# Patient Record
Sex: Female | Born: 1975 | Race: Black or African American | Hispanic: No | Marital: Married | Smoking: Current every day smoker
Health system: Southern US, Community
[De-identification: ages and names within clinical notes are randomized; demographics above are authoritative.]

## PROBLEM LIST (undated history)

## (undated) DIAGNOSIS — I160 Hypertensive urgency: Secondary | ICD-10-CM

## (undated) DIAGNOSIS — F419 Anxiety disorder, unspecified: Secondary | ICD-10-CM

## (undated) DIAGNOSIS — G43909 Migraine, unspecified, not intractable, without status migrainosus: Secondary | ICD-10-CM

## (undated) DIAGNOSIS — I10 Essential (primary) hypertension: Secondary | ICD-10-CM

## (undated) DIAGNOSIS — F329 Major depressive disorder, single episode, unspecified: Secondary | ICD-10-CM

## (undated) DIAGNOSIS — R002 Palpitations: Secondary | ICD-10-CM

## (undated) DIAGNOSIS — D649 Anemia, unspecified: Secondary | ICD-10-CM

## (undated) DIAGNOSIS — F32A Depression, unspecified: Secondary | ICD-10-CM

## (undated) DIAGNOSIS — N179 Acute kidney failure, unspecified: Secondary | ICD-10-CM

## (undated) DIAGNOSIS — R1031 Right lower quadrant pain: Secondary | ICD-10-CM

## (undated) HISTORY — DX: Essential (primary) hypertension: I10

## (undated) HISTORY — DX: Anemia, unspecified: D64.9

## (undated) HISTORY — DX: Anxiety disorder, unspecified: F41.9

## (undated) HISTORY — DX: Palpitations: R00.2

---
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
## 1997-06-20 ENCOUNTER — Emergency Department (HOSPITAL_COMMUNITY): Admission: EM | Admit: 1997-06-20 | Discharge: 1997-06-20 | Payer: Self-pay | Admitting: Emergency Medicine

## 2000-01-24 ENCOUNTER — Emergency Department (HOSPITAL_COMMUNITY): Admission: EM | Admit: 2000-01-24 | Discharge: 2000-01-24 | Payer: Self-pay | Admitting: Emergency Medicine

## 2001-03-11 ENCOUNTER — Ambulatory Visit (HOSPITAL_COMMUNITY): Admission: RE | Admit: 2001-03-11 | Discharge: 2001-03-11 | Payer: Self-pay | Admitting: *Deleted

## 2001-07-24 ENCOUNTER — Inpatient Hospital Stay (HOSPITAL_COMMUNITY): Admission: AD | Admit: 2001-07-24 | Discharge: 2001-07-28 | Payer: Self-pay | Admitting: *Deleted

## 2001-08-02 ENCOUNTER — Inpatient Hospital Stay (HOSPITAL_COMMUNITY): Admission: AD | Admit: 2001-08-02 | Discharge: 2001-08-04 | Payer: Self-pay | Admitting: *Deleted

## 2001-09-10 ENCOUNTER — Emergency Department (HOSPITAL_COMMUNITY): Admission: EM | Admit: 2001-09-10 | Discharge: 2001-09-10 | Payer: Self-pay | Admitting: Emergency Medicine

## 2002-02-13 ENCOUNTER — Emergency Department (HOSPITAL_COMMUNITY): Admission: EM | Admit: 2002-02-13 | Discharge: 2002-02-14 | Payer: Self-pay | Admitting: Emergency Medicine

## 2002-03-30 ENCOUNTER — Emergency Department (HOSPITAL_COMMUNITY): Admission: EM | Admit: 2002-03-30 | Discharge: 2002-03-31 | Payer: Self-pay | Admitting: Emergency Medicine

## 2002-10-07 ENCOUNTER — Encounter: Admission: RE | Admit: 2002-10-07 | Discharge: 2002-10-07 | Payer: Self-pay | Admitting: Family Medicine

## 2002-10-11 ENCOUNTER — Ambulatory Visit (HOSPITAL_COMMUNITY): Admission: RE | Admit: 2002-10-11 | Discharge: 2002-10-11 | Payer: Self-pay | Admitting: Family Medicine

## 2002-12-16 ENCOUNTER — Encounter: Admission: RE | Admit: 2002-12-16 | Discharge: 2002-12-16 | Payer: Self-pay | Admitting: Family Medicine

## 2002-12-22 ENCOUNTER — Encounter: Admission: RE | Admit: 2002-12-22 | Discharge: 2002-12-22 | Payer: Self-pay | Admitting: Obstetrics and Gynecology

## 2003-01-30 ENCOUNTER — Ambulatory Visit (HOSPITAL_COMMUNITY): Admission: RE | Admit: 2003-01-30 | Discharge: 2003-01-30 | Payer: Self-pay | Admitting: Internal Medicine

## 2003-01-30 ENCOUNTER — Encounter: Admission: RE | Admit: 2003-01-30 | Discharge: 2003-01-30 | Payer: Self-pay | Admitting: Internal Medicine

## 2003-03-30 ENCOUNTER — Encounter: Admission: RE | Admit: 2003-03-30 | Discharge: 2003-03-30 | Payer: Self-pay | Admitting: Internal Medicine

## 2003-06-07 ENCOUNTER — Encounter (INDEPENDENT_AMBULATORY_CARE_PROVIDER_SITE_OTHER): Payer: Self-pay | Admitting: *Deleted

## 2003-10-05 ENCOUNTER — Ambulatory Visit (HOSPITAL_COMMUNITY): Admission: RE | Admit: 2003-10-05 | Discharge: 2003-10-05 | Payer: Self-pay | Admitting: *Deleted

## 2003-10-19 ENCOUNTER — Ambulatory Visit: Payer: Self-pay | Admitting: Family Medicine

## 2003-10-20 ENCOUNTER — Ambulatory Visit: Payer: Self-pay | Admitting: Obstetrics & Gynecology

## 2003-10-25 ENCOUNTER — Ambulatory Visit: Payer: Self-pay | Admitting: Obstetrics & Gynecology

## 2003-11-22 ENCOUNTER — Ambulatory Visit: Payer: Self-pay | Admitting: *Deleted

## 2003-12-06 ENCOUNTER — Ambulatory Visit: Payer: Self-pay | Admitting: *Deleted

## 2003-12-06 ENCOUNTER — Ambulatory Visit (HOSPITAL_COMMUNITY): Admission: RE | Admit: 2003-12-06 | Discharge: 2003-12-06 | Payer: Self-pay | Admitting: *Deleted

## 2003-12-12 ENCOUNTER — Inpatient Hospital Stay (HOSPITAL_COMMUNITY): Admission: AD | Admit: 2003-12-12 | Discharge: 2003-12-12 | Payer: Self-pay | Admitting: *Deleted

## 2003-12-27 ENCOUNTER — Ambulatory Visit: Payer: Self-pay | Admitting: *Deleted

## 2004-01-07 HISTORY — PX: TUBAL LIGATION: SHX77

## 2004-01-10 ENCOUNTER — Ambulatory Visit: Payer: Self-pay | Admitting: *Deleted

## 2004-01-24 ENCOUNTER — Ambulatory Visit: Payer: Self-pay | Admitting: *Deleted

## 2004-01-31 ENCOUNTER — Ambulatory Visit: Payer: Self-pay | Admitting: Obstetrics & Gynecology

## 2004-02-04 ENCOUNTER — Ambulatory Visit: Payer: Self-pay | Admitting: Obstetrics & Gynecology

## 2004-02-04 ENCOUNTER — Inpatient Hospital Stay (HOSPITAL_COMMUNITY): Admission: AD | Admit: 2004-02-04 | Discharge: 2004-02-06 | Payer: Self-pay | Admitting: Obstetrics & Gynecology

## 2004-02-14 ENCOUNTER — Ambulatory Visit: Payer: Self-pay | Admitting: Obstetrics & Gynecology

## 2004-02-21 ENCOUNTER — Ambulatory Visit: Payer: Self-pay | Admitting: *Deleted

## 2004-02-23 ENCOUNTER — Inpatient Hospital Stay (HOSPITAL_COMMUNITY): Admission: AD | Admit: 2004-02-23 | Discharge: 2004-02-23 | Payer: Self-pay | Admitting: Obstetrics and Gynecology

## 2004-02-28 ENCOUNTER — Ambulatory Visit (HOSPITAL_COMMUNITY): Admission: RE | Admit: 2004-02-28 | Discharge: 2004-02-28 | Payer: Self-pay | Admitting: Obstetrics & Gynecology

## 2004-02-28 ENCOUNTER — Ambulatory Visit: Payer: Self-pay | Admitting: *Deleted

## 2004-03-06 ENCOUNTER — Ambulatory Visit: Payer: Self-pay | Admitting: *Deleted

## 2004-03-07 ENCOUNTER — Ambulatory Visit: Payer: Self-pay | Admitting: Family Medicine

## 2004-03-13 ENCOUNTER — Ambulatory Visit: Payer: Self-pay | Admitting: *Deleted

## 2004-03-20 ENCOUNTER — Ambulatory Visit: Payer: Self-pay | Admitting: *Deleted

## 2004-03-20 ENCOUNTER — Ambulatory Visit (HOSPITAL_COMMUNITY): Admission: RE | Admit: 2004-03-20 | Discharge: 2004-03-20 | Payer: Self-pay | Admitting: *Deleted

## 2004-03-27 ENCOUNTER — Ambulatory Visit: Payer: Self-pay | Admitting: *Deleted

## 2004-04-03 ENCOUNTER — Ambulatory Visit: Payer: Self-pay | Admitting: *Deleted

## 2004-04-10 ENCOUNTER — Inpatient Hospital Stay (HOSPITAL_COMMUNITY): Admission: AD | Admit: 2004-04-10 | Discharge: 2004-04-10 | Payer: Self-pay | Admitting: Obstetrics & Gynecology

## 2004-04-16 ENCOUNTER — Inpatient Hospital Stay (HOSPITAL_COMMUNITY): Admission: AD | Admit: 2004-04-16 | Discharge: 2004-04-16 | Payer: Self-pay | Admitting: *Deleted

## 2004-04-17 ENCOUNTER — Inpatient Hospital Stay (HOSPITAL_COMMUNITY): Admission: AD | Admit: 2004-04-17 | Discharge: 2004-04-17 | Payer: Self-pay | Admitting: Obstetrics and Gynecology

## 2004-04-18 ENCOUNTER — Ambulatory Visit: Payer: Self-pay | Admitting: Family Medicine

## 2004-04-23 ENCOUNTER — Ambulatory Visit: Payer: Self-pay | Admitting: *Deleted

## 2004-04-23 ENCOUNTER — Inpatient Hospital Stay (HOSPITAL_COMMUNITY): Admission: AD | Admit: 2004-04-23 | Discharge: 2004-04-23 | Payer: Self-pay | Admitting: *Deleted

## 2004-04-25 ENCOUNTER — Ambulatory Visit: Payer: Self-pay | Admitting: Family Medicine

## 2004-04-25 ENCOUNTER — Inpatient Hospital Stay (HOSPITAL_COMMUNITY): Admission: AD | Admit: 2004-04-25 | Discharge: 2004-04-28 | Payer: Self-pay | Admitting: *Deleted

## 2004-04-30 ENCOUNTER — Inpatient Hospital Stay (HOSPITAL_COMMUNITY): Admission: AD | Admit: 2004-04-30 | Discharge: 2004-05-02 | Payer: Self-pay | Admitting: *Deleted

## 2004-04-30 ENCOUNTER — Ambulatory Visit: Payer: Self-pay | Admitting: Obstetrics & Gynecology

## 2004-06-04 ENCOUNTER — Ambulatory Visit: Payer: Self-pay | Admitting: Family Medicine

## 2004-07-02 ENCOUNTER — Ambulatory Visit: Payer: Self-pay | Admitting: Sports Medicine

## 2005-01-22 ENCOUNTER — Ambulatory Visit: Payer: Self-pay | Admitting: Family Medicine

## 2005-01-23 ENCOUNTER — Ambulatory Visit: Payer: Self-pay | Admitting: *Deleted

## 2005-02-05 ENCOUNTER — Other Ambulatory Visit: Admission: RE | Admit: 2005-02-05 | Discharge: 2005-02-05 | Payer: Self-pay | Admitting: Family Medicine

## 2005-02-05 ENCOUNTER — Ambulatory Visit: Payer: Self-pay | Admitting: Family Medicine

## 2005-06-04 ENCOUNTER — Ambulatory Visit: Payer: Self-pay | Admitting: Family Medicine

## 2005-06-09 ENCOUNTER — Emergency Department (HOSPITAL_COMMUNITY): Admission: EM | Admit: 2005-06-09 | Discharge: 2005-06-09 | Payer: Self-pay | Admitting: Emergency Medicine

## 2005-06-11 ENCOUNTER — Ambulatory Visit: Payer: Self-pay | Admitting: Family Medicine

## 2005-06-16 ENCOUNTER — Ambulatory Visit (HOSPITAL_COMMUNITY): Admission: RE | Admit: 2005-06-16 | Discharge: 2005-06-16 | Payer: Self-pay | Admitting: Family Medicine

## 2005-06-30 ENCOUNTER — Ambulatory Visit: Payer: Self-pay | Admitting: Family Medicine

## 2005-08-25 ENCOUNTER — Emergency Department (HOSPITAL_COMMUNITY): Admission: EM | Admit: 2005-08-25 | Discharge: 2005-08-25 | Payer: Self-pay | Admitting: Emergency Medicine

## 2006-03-05 DIAGNOSIS — I10 Essential (primary) hypertension: Secondary | ICD-10-CM

## 2006-03-06 ENCOUNTER — Encounter (INDEPENDENT_AMBULATORY_CARE_PROVIDER_SITE_OTHER): Payer: Self-pay | Admitting: *Deleted

## 2006-07-04 ENCOUNTER — Emergency Department (HOSPITAL_COMMUNITY): Admission: EM | Admit: 2006-07-04 | Discharge: 2006-07-04 | Payer: Self-pay | Admitting: Emergency Medicine

## 2008-01-07 HISTORY — PX: HYSTEROSCOPY W/ ENDOMETRIAL ABLATION: SUR665

## 2008-09-20 ENCOUNTER — Emergency Department (HOSPITAL_COMMUNITY): Admission: EM | Admit: 2008-09-20 | Discharge: 2008-09-20 | Payer: Self-pay | Admitting: Family Medicine

## 2008-09-30 ENCOUNTER — Other Ambulatory Visit: Payer: Self-pay | Admitting: Emergency Medicine

## 2008-09-30 ENCOUNTER — Encounter: Payer: Self-pay | Admitting: Obstetrics & Gynecology

## 2008-09-30 ENCOUNTER — Observation Stay (HOSPITAL_COMMUNITY): Admission: AD | Admit: 2008-09-30 | Discharge: 2008-10-01 | Payer: Self-pay | Admitting: Obstetrics & Gynecology

## 2008-10-12 ENCOUNTER — Ambulatory Visit: Payer: Self-pay | Admitting: Obstetrics & Gynecology

## 2008-10-12 ENCOUNTER — Encounter: Payer: Self-pay | Admitting: Obstetrics and Gynecology

## 2008-11-23 ENCOUNTER — Encounter: Payer: Self-pay | Admitting: Obstetrics and Gynecology

## 2008-11-23 ENCOUNTER — Ambulatory Visit: Payer: Self-pay | Admitting: Obstetrics & Gynecology

## 2008-11-23 LAB — CONVERTED CEMR LAB
Hemoglobin: 14.1 g/dL (ref 12.0–15.0)
MCHC: 33.3 g/dL (ref 30.0–36.0)
MCV: 86.3 fL (ref 78.0–100.0)
RBC: 4.9 M/uL (ref 3.87–5.11)
WBC: 9.6 10*3/uL (ref 4.0–10.5)

## 2008-11-24 ENCOUNTER — Ambulatory Visit: Payer: Self-pay | Admitting: Family Medicine

## 2008-11-24 ENCOUNTER — Encounter: Payer: Self-pay | Admitting: Family Medicine

## 2008-11-24 DIAGNOSIS — N921 Excessive and frequent menstruation with irregular cycle: Secondary | ICD-10-CM | POA: Insufficient documentation

## 2008-11-24 DIAGNOSIS — F17200 Nicotine dependence, unspecified, uncomplicated: Secondary | ICD-10-CM | POA: Insufficient documentation

## 2008-11-24 LAB — CONVERTED CEMR LAB
CO2: 21 meq/L (ref 19–32)
Calcium: 10.7 mg/dL — ABNORMAL HIGH (ref 8.4–10.5)
Chloride: 101 meq/L (ref 96–112)
Glucose, Bld: 76 mg/dL (ref 70–99)
Sodium: 139 meq/L (ref 135–145)

## 2008-11-27 ENCOUNTER — Ambulatory Visit: Payer: Self-pay | Admitting: Family Medicine

## 2008-12-08 ENCOUNTER — Ambulatory Visit: Payer: Self-pay | Admitting: Family Medicine

## 2008-12-13 ENCOUNTER — Ambulatory Visit (HOSPITAL_COMMUNITY): Admission: RE | Admit: 2008-12-13 | Discharge: 2008-12-13 | Payer: Self-pay | Admitting: Obstetrics & Gynecology

## 2008-12-13 ENCOUNTER — Ambulatory Visit: Payer: Self-pay | Admitting: Obstetrics & Gynecology

## 2008-12-31 ENCOUNTER — Emergency Department (HOSPITAL_COMMUNITY): Admission: EM | Admit: 2008-12-31 | Discharge: 2008-12-31 | Payer: Self-pay | Admitting: Emergency Medicine

## 2009-01-09 ENCOUNTER — Encounter: Payer: Self-pay | Admitting: Family Medicine

## 2009-01-09 ENCOUNTER — Ambulatory Visit: Payer: Self-pay | Admitting: Family Medicine

## 2009-01-09 LAB — CONVERTED CEMR LAB
BUN: 10 mg/dL (ref 6–23)
CO2: 25 meq/L (ref 19–32)
Calcium: 9.8 mg/dL (ref 8.4–10.5)
Creatinine, Ser: 0.67 mg/dL (ref 0.40–1.20)

## 2009-01-11 ENCOUNTER — Ambulatory Visit: Payer: Self-pay | Admitting: Obstetrics and Gynecology

## 2009-01-11 ENCOUNTER — Encounter: Payer: Self-pay | Admitting: Family Medicine

## 2009-01-25 ENCOUNTER — Ambulatory Visit: Payer: Self-pay | Admitting: Family Medicine

## 2009-01-25 DIAGNOSIS — J019 Acute sinusitis, unspecified: Secondary | ICD-10-CM

## 2009-01-31 ENCOUNTER — Encounter: Payer: Self-pay | Admitting: Family Medicine

## 2009-02-26 ENCOUNTER — Ambulatory Visit: Payer: Self-pay | Admitting: Family Medicine

## 2009-02-26 DIAGNOSIS — F4001 Agoraphobia with panic disorder: Secondary | ICD-10-CM

## 2009-03-15 ENCOUNTER — Ambulatory Visit: Payer: Self-pay | Admitting: Obstetrics & Gynecology

## 2009-03-15 ENCOUNTER — Encounter: Payer: Self-pay | Admitting: Obstetrics & Gynecology

## 2009-03-15 LAB — CONVERTED CEMR LAB
MCHC: 35.5 g/dL (ref 30.0–36.0)
MCV: 88.6 fL (ref 78.0–100.0)
Platelets: 284 10*3/uL (ref 150–400)
RBC: 4.64 M/uL (ref 3.87–5.11)
RDW: 12.4 % (ref 11.5–15.5)

## 2009-04-17 ENCOUNTER — Ambulatory Visit: Payer: Self-pay | Admitting: Family Medicine

## 2009-04-17 ENCOUNTER — Encounter: Payer: Self-pay | Admitting: Family Medicine

## 2009-04-17 LAB — CONVERTED CEMR LAB: Whiff Test: NEGATIVE

## 2009-05-15 ENCOUNTER — Ambulatory Visit: Payer: Self-pay | Admitting: Family Medicine

## 2009-05-15 DIAGNOSIS — R300 Dysuria: Secondary | ICD-10-CM | POA: Insufficient documentation

## 2009-05-15 DIAGNOSIS — R1011 Right upper quadrant pain: Secondary | ICD-10-CM

## 2009-05-15 LAB — CONVERTED CEMR LAB
ALT: 24 units/L (ref 0–35)
AST: 21 units/L (ref 0–37)
BUN: 11 mg/dL (ref 6–23)
Blood in Urine, dipstick: NEGATIVE
Calcium: 9.1 mg/dL (ref 8.4–10.5)
Chloride: 107 meq/L (ref 96–112)
Creatinine, Ser: 0.85 mg/dL (ref 0.40–1.20)
Glucose, Urine, Semiquant: NEGATIVE
HCT: 40.6 % (ref 36.0–46.0)
Hemoglobin: 14.1 g/dL (ref 12.0–15.0)
MCHC: 34.7 g/dL (ref 30.0–36.0)
MCV: 89.4 fL (ref 78.0–100.0)
Nitrite: NEGATIVE
Platelets: 198 10*3/uL (ref 150–400)
Protein, U semiquant: NEGATIVE
RDW: 12.5 % (ref 11.5–15.5)
Total Bilirubin: 0.3 mg/dL (ref 0.3–1.2)
Urobilinogen, UA: 0.2
WBC Urine, dipstick: NEGATIVE

## 2009-05-16 ENCOUNTER — Telehealth: Payer: Self-pay | Admitting: Family Medicine

## 2009-05-17 ENCOUNTER — Encounter: Admission: RE | Admit: 2009-05-17 | Discharge: 2009-05-17 | Payer: Self-pay | Admitting: Family Medicine

## 2009-05-17 ENCOUNTER — Telehealth: Payer: Self-pay | Admitting: Family Medicine

## 2009-10-15 ENCOUNTER — Encounter: Payer: Self-pay | Admitting: Family Medicine

## 2009-10-15 ENCOUNTER — Ambulatory Visit: Payer: Self-pay | Admitting: Family Medicine

## 2009-10-15 DIAGNOSIS — R1031 Right lower quadrant pain: Secondary | ICD-10-CM | POA: Insufficient documentation

## 2009-10-15 LAB — CONVERTED CEMR LAB
ALT: 32 units/L (ref 0–35)
AST: 26 units/L (ref 0–37)
Albumin: 4.7 g/dL (ref 3.5–5.2)
Alkaline Phosphatase: 71 units/L (ref 39–117)
BUN: 10 mg/dL (ref 6–23)
Basophils Absolute: 0 10*3/uL (ref 0.0–0.1)
Basophils Relative: 0 % (ref 0–1)
Beta hcg, urine, semiquantitative: NEGATIVE
Bilirubin Urine: NEGATIVE
Blood in Urine, dipstick: NEGATIVE
CO2: 28 meq/L (ref 19–32)
Calcium: 10.1 mg/dL (ref 8.4–10.5)
Chlamydia, DNA Probe: NEGATIVE
Chloride: 101 meq/L (ref 96–112)
Creatinine, Ser: 0.94 mg/dL (ref 0.40–1.20)
Eosinophils Absolute: 0.9 10*3/uL — ABNORMAL HIGH (ref 0.0–0.7)
Eosinophils Relative: 6 % — ABNORMAL HIGH (ref 0–5)
GC Probe Amp, Genital: NEGATIVE
Glucose, Bld: 80 mg/dL (ref 70–99)
Glucose, Urine, Semiquant: NEGATIVE
HCT: 44.9 % (ref 36.0–46.0)
Hemoglobin: 15.7 g/dL — ABNORMAL HIGH (ref 12.0–15.0)
Ketones, urine, test strip: NEGATIVE
Lymphocytes Relative: 17 % (ref 12–46)
Lymphs Abs: 2.4 10*3/uL (ref 0.7–4.0)
MCHC: 35 g/dL (ref 30.0–36.0)
MCV: 91.8 fL (ref 78.0–100.0)
Monocytes Absolute: 0.9 10*3/uL (ref 0.1–1.0)
Monocytes Relative: 6 % (ref 3–12)
Neutro Abs: 10.1 10*3/uL — ABNORMAL HIGH (ref 1.7–7.7)
Neutrophils Relative %: 71 % (ref 43–77)
Nitrite: NEGATIVE
Platelets: 261 10*3/uL (ref 150–400)
Potassium: 3.9 meq/L (ref 3.5–5.3)
Protein, U semiquant: NEGATIVE
RBC: 4.89 M/uL (ref 3.87–5.11)
RDW: 12.1 % (ref 11.5–15.5)
Sodium: 139 meq/L (ref 135–145)
Specific Gravity, Urine: 1.02
Total Bilirubin: 0.5 mg/dL (ref 0.3–1.2)
Total Protein: 7.7 g/dL (ref 6.0–8.3)
Urobilinogen, UA: 0.2
WBC: 14.3 10*3/uL — ABNORMAL HIGH (ref 4.0–10.5)
Whiff Test: NEGATIVE
pH: 7.5

## 2009-12-21 ENCOUNTER — Encounter: Payer: Self-pay | Admitting: Family Medicine

## 2010-01-24 ENCOUNTER — Telehealth: Payer: Self-pay | Admitting: Family Medicine

## 2010-02-03 ENCOUNTER — Emergency Department (HOSPITAL_COMMUNITY)
Admission: EM | Admit: 2010-02-03 | Discharge: 2010-02-03 | Payer: Self-pay | Source: Home / Self Care | Admitting: Emergency Medicine

## 2010-02-03 DIAGNOSIS — R0602 Shortness of breath: Secondary | ICD-10-CM | POA: Insufficient documentation

## 2010-02-03 LAB — COMPREHENSIVE METABOLIC PANEL
AST: 28 U/L (ref 0–37)
BUN: 9 mg/dL (ref 6–23)
CO2: 25 mEq/L (ref 19–32)
Calcium: 9.3 mg/dL (ref 8.4–10.5)
Creatinine, Ser: 0.73 mg/dL (ref 0.4–1.2)
GFR calc Af Amer: >60 mL/min (ref >60)
GFR calc non Af Amer: >60 mL/min (ref >60)

## 2010-02-03 LAB — DIFFERENTIAL
Basophils Absolute: 0.1 10*3/uL (ref 0.0–0.1)
Lymphocytes Relative: 19 % (ref 12–46)
Lymphs Abs: 2.7 10*3/uL (ref 0.7–4.0)
Monocytes Absolute: 0.9 10*3/uL (ref 0.1–1.0)
Monocytes Relative: 7 % (ref 3–12)
Neutro Abs: 9.9 10*3/uL — ABNORMAL HIGH (ref 1.7–7.7)

## 2010-02-03 LAB — CBC
HCT: 46.3 % — ABNORMAL HIGH (ref 36.0–46.0)
Hemoglobin: 16.6 g/dL — ABNORMAL HIGH (ref 12.0–15.0)
MCHC: 35.9 g/dL (ref 30.0–36.0)
MCV: 90.8 fL (ref 78.0–100.0)

## 2010-02-03 LAB — POCT CARDIAC MARKERS
CKMB, poc: <1 ng/mL — ABNORMAL LOW (ref 1.0–8.0)
Myoglobin, poc: 72.8 ng/mL (ref 12–200)
Myoglobin, poc: 49.7 ng/mL (ref 12–200)
Troponin i, poc: <0.05 ng/mL (ref 0.00–0.09)
Troponin i, poc: <0.05 ng/mL (ref 0.00–0.09)

## 2010-02-05 ENCOUNTER — Telehealth: Payer: Self-pay | Admitting: Family Medicine

## 2010-02-05 ENCOUNTER — Encounter: Payer: Self-pay | Admitting: Family Medicine

## 2010-02-05 ENCOUNTER — Ambulatory Visit: Admission: RE | Admit: 2010-02-05 | Discharge: 2010-02-05 | Payer: Self-pay | Source: Home / Self Care

## 2010-02-05 ENCOUNTER — Other Ambulatory Visit: Payer: Self-pay

## 2010-02-05 DIAGNOSIS — E871 Hypo-osmolality and hyponatremia: Secondary | ICD-10-CM | POA: Insufficient documentation

## 2010-02-05 DIAGNOSIS — R002 Palpitations: Secondary | ICD-10-CM | POA: Insufficient documentation

## 2010-02-05 LAB — CONVERTED CEMR LAB
BUN: 11 mg/dL (ref 6–23)
Calcium: 10.2 mg/dL (ref 8.4–10.5)
Chloride: 100 meq/L (ref 96–112)
Creatinine, Ser: 0.81 mg/dL (ref 0.40–1.20)

## 2010-02-07 NOTE — Miscellaneous (Signed)
  Clinical Lists Changes  Problems: Removed problem of OTHER SYMPTOMS INVOLVING ABDOMEN AND PELVIS (ICD-789.9) Removed problem of CONTACT OR EXPOSURE TO OTHER VIRAL DISEASES (ICD-V01.79)

## 2010-02-07 NOTE — Progress Notes (Signed)
  Phone Note Outgoing Call   Call placed by: Paula Compton MD,  May 16, 2009 9:38 AM Call placed to: Patient Summary of Call: Called patient at [phone] to give results of labs.  She has the [place] scheduled for tomorrow.  Still  has the abd pain a little bit, not as bad as before.  Initial call taken by: Paula Compton MD,  May 16, 2009 9:40 AM

## 2010-02-07 NOTE — Progress Notes (Signed)
  Phone Note Outgoing Call   Call placed by: Paula Compton MD,  May 17, 2009 10:21 AM Call placed to: Patient Summary of Call: Called patient at [phone], she says the pain is better.  Reviewed results of the abd [place], which does not show cholelithiasis nor other potential causes of the pain.  To continue with current plan. She agrees. Initial call taken by: Paula Compton MD,  May 17, 2009 10:23 AM

## 2010-02-07 NOTE — Miscellaneous (Signed)
Summary: meds  C   linical Lists Changes medicaid will not pay for fexodenadine. they want pt to try & then fail 2 others first-ceterizine OTC & Loratidine OTC. for the nasonex, try flunisolide or fluticasone. form in your chart box if you want to try to get them approved as is.Golden Circle RN  January 31, 2009 11:17 AM  Medications: Removed medication of NASONEX 50 MCG/ACT SUSP (MOMETASONE FUROATE) 2 sprays ea nostril daily Removed medication of ALLEGRA 180 MG TABS (FEXOFENADINE HCL) 1 by mouth daily Added new medication of FLUTICASONE PROPIONATE 50 MCG/ACT SUSP (FLUTICASONE PROPIONATE) 2 sprays each nostril daily - Signed Rx of FLUTICASONE PROPIONATE 50 MCG/ACT SUSP (FLUTICASONE PROPIONATE) 2 sprays each nostril daily;  #1bottle x 1;  Signed;  Entered by: Clementeen Graham MD;  Authorized by: Clementeen Graham MD;  Method used: Electronically to [facility] [address] [phone]*, [address] [place], [place], [place]  [address], Ph: [phone] or [phone], Fax: [phone]    Called patient back.  She will take Flonase nasal spray and use claritin D OTC.  She will return to clinic if not improved in a few days.      Prescriptions: FLUTICASONE PROPIONATE 50 MCG/ACT SUSP (FLUTICASONE PROPIONATE) 2 sprays each nostril daily  #1bottle x 1   Entered and Authorized by:   Clementeen Graham MD   Signed by:   Clementeen Graham MD on 02/09/2009   Method used:   Electronically to        [facility] [address] [phone]* (retail)       [address]       [place], [place]  [address]       Ph: [phone] or [phone]       Fax: [phone]   RxID:   [id number]

## 2010-02-07 NOTE — Assessment & Plan Note (Signed)
Summary: abdominal pain/corey/bmc   Vital Signs:  Patient profile:   35 year old female Height:      68 inches Weight:      175.56 pounds BMI:     26.79 BSA:     1.94 Temp:     98.3 degrees F Pulse rate:   90 / minute BP sitting:   135 / 91  Vitals Entered By: Jone Baseman CMA (October 15, 2009 2:38 PM) CC: abdominal pain x 3 days Is Patient Diabetic? No Pain Assessment Patient in pain? yes     Location: right flank Intensity: 6   Primary Care Provider:  Clementeen Graham, MD  CC:  abdominal pain x 3 days.  History of Present Illness: 35 yo F:  1. Abdominal Pain: Right lower quadrant, x 3 days, intermittent, radiates to suprapubic area and low back, worse with movevement, better with Ibuprofen, LMP last Feb (s/p ablation for menorrhagia 2/2 ? fibroid), previously Dx with ovarian cyst for similar complaint, + sexually active with one partner. Denies vaginal discharge, dysuria, fever/chills, HA, dizziness, edema, rash. Endorses N/V "all day" Saturday. No diarrhea. + black stools but took Pepto Bismol the day before. + constipation - not using stool softener/laxative. + Tob, + occasional ETOH, no drugs. 25 minutes spent with patient for full H&P.  Habits & Providers  Alcohol-Tobacco-Diet     Tobacco Status: current     Tobacco Counseling: to quit use of tobacco products     Cigarette Packs/Day: 0.5  Current Medications (verified): 1)  Lisinopril-Hydrochlorothiazide 20-25 Mg Tabs (Lisinopril-Hydrochlorothiazide) .... Take 1 Pill By Mouth Daily 2)  Bupropion Hcl 150 Mg Xr12h-Tab (Bupropion Hcl) .... Take 1/2 Daily For Three Days Then 1 Tab Daily By Mouth Every Morning  Allergies (verified): No Known Drug Allergies PMH-FH-SH reviewed for relevance  Review of Systems      See HPI  Physical Exam  General:  Well-developed,well-nourished, unconfortable-appearing. Vitals reviewed. Lungs:  Normal respiratory effort, chest expands symmetrically. Lungs are clear to auscultation,  no crackles or wheezes. Heart:  Normal rate and regular rhythm. S1 and S2 normal without gallop, murmur, click, rub or other extra sounds. Abdomen:  Soft, nontender, nondistended. Mild discomfort with deep palpation RLQ. No rebound. No masses or megaly.  Normal bowel sounds noted. Negative for CVA tenderness. No suprapubic tenderness.  Genitalia:  Pelvic Exam:        External: normal female genitalia without lesions or masses        Vagina: normal without lesions or masses, think white discharge - samples taken        Cervix: normal without lesions or masses        Adnexa: slight ttp over left ovary        Uterus: normal by palpation Pulses:  R and L dorsalis pedis and posterior tibial pulses are full and equal bilaterally. Extremities:  No edema. Skin:  Intact without suspicious lesions or rashes.   Impression & Recommendations:  Problem # 1:  ABDOMINAL PAIN, RIGHT LOWER QUADRANT (ICD-789.03) Assessment New No red flags. See patient instructions. Patient left after giving urine sample - UA revealed trichomonas. Will have patient called to come back for treatment with Flagyl 2 grams (single dose). DDx: PID, ovarian cyst, gastroenteritis, early appendicitis (less likely), other infectious. Precautions given. Advised to call in 1-2 days if not improving. May need [place]. Orders: Comp Met-[facility] [id number]) CBC w/Diff-[facility] (85025) U Preg-[facility] (81025) [facility]- Est  Level 4 ([address]782)  Complete Medication List: 1)  Lisinopril-hydrochlorothiazide 20-25 Mg  Tabs (Lisinopril-hydrochlorothiazide) .... Take 1 pill by mouth daily 2)  Bupropion Hcl 150 Mg Xr12h-tab (Bupropion hcl) .... Take 1/2 daily for three days then 1 tab daily by mouth every morning  Other Orders: Urinalysis-[facility] (00000) GC/Chlamydia-[facility] (87591/87491) [facility] ([address]78)  Patient Instructions: 1)  It was nice to meet you today! 2)  We are evaluating the reason for your right lower abdominal pain.  3)  We are checking labs and your  urine.  4)  This is likely due to an ovarian cyst. 5)  Continue taking Ibuprofen - 600 mg by mouth every 6 hours as needed for pain. 6)  If your pain worsens, we will get an ultrasound.  Laboratory Results   Urine Tests  Date/Time Received: October 15, 2009 3:00 PM  Date/Time Reported: October 15, 2009 3:36 PM   Routine Urinalysis   Color: yellow Appearance: Clear Glucose: negative   (Normal Range: Negative) Bilirubin: negative   (Normal Range: Negative) Ketone: negative   (Normal Range: Negative) Spec. Gravity: 1.020   (Normal Range: 1.003-1.035) Blood: negative   (Normal Range: Negative) pH: 7.5   (Normal Range: 5.0-8.0) Protein: negative   (Normal Range: Negative) Urobilinogen: 0.2   (Normal Range: 0-1) Nitrite: negative   (Normal Range: Negative) Leukocyte Esterace: trace   (Normal Range: Negative)  Urine Microscopic WBC/HPF: 1-5 RBC/HPF: 0-2 Bacteria/HPF: trace Epithelial/HPF: 0-3 Other: few trich    Urine HCG: negative Comments: ...........test performed by...........Marland KitchenTerese Door, CMA  Date/Time Received: October 15, 2009 3:00 PM  Date/Time Reported: October 15, 2009 3:09 PM   [employer] Source: vaginal WBC/hpf: 1-5 Bacteria/hpf: 2+  Rods Clue cells/hpf: none  Negative whiff Yeast/hpf: none Trichomonas/hpf: none Comments: ...........test performed by...........Marland KitchenTerese Door, CMA     Appended Document: abdominal pain/corey/bmc Please call this patient to come in for + trichomonas. Please give Flagyl 2 grams - single dose.

## 2010-02-07 NOTE — Progress Notes (Signed)
Summary: refill  Phone Note Refill Request Call back at Home Phone [phone] Message from:  Patient  Refills Requested: Medication #1:  LISINOPRIL-HYDROCHLOROTHIAZIDE 20-25 MG TABS take 1 pill by mouth daily [facility] [address]  Initial call taken by: De Nurse,  January 24, 2010 9:25 AM    Prescriptions: LISINOPRIL-HYDROCHLOROTHIAZIDE 20-25 MG TABS (LISINOPRIL-HYDROCHLOROTHIAZIDE) take 1 pill by mouth daily  #30 x 3   Entered and Authorized by:   Clementeen Graham MD   Signed by:   Clementeen Graham MD on 01/24/2010   Method used:   Electronically to        [facility] [phone]* (retail)       [address]       [place], [place]  [address]       Ph: [phone]       Fax: [phone]   RxID:   [id number]

## 2010-02-07 NOTE — Assessment & Plan Note (Signed)
Summary: fu/kh   Vital Signs:  Patient profile:   35 year old female Height:      68 inches Weight:      161 pounds BMI:     24.57 Temp:     98.3 degrees F oral Pulse rate:   76 / minute BP sitting:   116 / 73  (right arm) Cuff size:   regular  Vitals Entered By: Tessie Fass CMA (January 09, 2009 3:21 PM) CC: F/U BP Is Patient Diabetic? No Pain Assessment Patient in pain? yes     Location: right side Intensity: 6   Primary Care Provider:  Clementeen Graham, MD  CC:  F/U BP.  History of Present Illness: HTN:   Pt has been taking her BP medications daily.  She feels well with no syncope or pre-syncope symptoms.  She is interested in combining the medications into 1 comb pill if able. No chest pain, dyspnea, or palpations.  Smoking: Plan to quit smoking with the GYN surgery last month. Tried to quit on own.  Is now smoking 1/2 ppd and wishes to quit completly. Has quit in the past sucessfully.  Has a personal history of 1 episode of depression which was treated with SSRI 6 months.  No seizure disorder.   Menstural Bleeding.  Had an endrometrial ablation last month.  Still having some residual bleeding.  Has an appointment with Dr. Debroah Loop on Thursday.  Habits & Providers  Alcohol-Tobacco-Diet     Tobacco Status: current     Cigarette Packs/Day: 0.5  Current Problems (verified): 1)  Cigarette Smoker  (ICD-305.1) 2)  Metrorrhagia  (ICD-626.6) 3)  Hypertension, Benign Systemic  (ICD-401.1)  Current Medications (verified): 1)  Medroxyprogesterone .Marland Kitchen.. 1 Tab By Mouth Bid 2)  Iron 325 (65 Fe) Mg Tabs (Ferrous Sulfate) .Marland Kitchen.. 1 By Mouth Bid 3)  Lisinopril-Hydrochlorothiazide 20-25 Mg Tabs (Lisinopril-Hydrochlorothiazide) .... Take 1 Pill By Mouth Daily 4)  Zyban 150 Mg Xr12h-Tab (Bupropion Hcl (Smoking Deter)) .... Take 1 Pill Daily For 7 Days Prior To Quit Date.  Then 1 Pill By Mouth Two Times A Day.  Allergies (verified): No Known Drug Allergies  Past History:  Past  Medical History: Last updated: 11/24/2008 [id number] Hypertension since 35 yo off and on HCTZ.  Not well controlled.  Has symptoms of end organ damage Daily light menstural bleeding since 09/2008.  Currently on Progresterone with plans for endormetral ablation. Hx of blood transfusion in 09/2008 due to anemia 2/2 menstural bleeding.  Now on iron.  Past Surgical History: Last updated: 03/05/2006 BTL - 04/06/2004  Family History: Last updated: 11/24/2008 HTN in Mother and Brother  Social History: Last updated: 11/24/2008 Lives with , Jill Side and daughter Genesis.  Divorced from husband. Smokes 1ppd, but had quit for 5 years in the past.   Risk Factors: Smoking Status: current (01/09/2009) Packs/Day: 0.5 (01/09/2009)  Social History: Packs/Day:  0.5  Review of Systems       ROS neg except for HPI.  Physical Exam  General:  VS noted. Well appearing in NAD Lungs:  CTABL Heart:  Normal rate and regular rhythm. S1 and S2 normal without gallop, murmur, click, rub or other extra sounds. Abdomen:  Non distended, NABS, soft, nontender, no guarding or rebound.     Impression & Recommendations:  Problem # 1:  CIGARETTE SMOKER (ICD-305.1) Assessment Improved  Plan to use Zyban with nicotine gum.  Quit date is Friday Jan 14th.  Will start Zyban Friday Jan 7th once daily and  go to two times a day on the 14th with cessation of tobacco then.   Will contact patient.  Her updated medication list for this problem includes:    Zyban 150 Mg Xr12h-tab (Bupropion hcl (smoking deter)) .Marland Kitchen... Take 1 pill daily for 7 days prior to quit date.  then 1 pill by mouth two times a day.  Orders: [facility]- Est Level  3 ([address])  Problem # 2:  HYPERTENSION, BENIGN SYSTEMIC (ICD-401.1) Assessment: Improved HTN is well controlled with no symptoms of Hyper or hypo tension. Lisinppril and HCTZ combined into 1 combo pill.   Will check BMP today.   F/u in a few months. Congratulated patient on HTN control.    The following medications were removed from the medication list:    Zestril 20 Mg Tabs (Lisinopril) .Marland Kitchen... Take one pill po daily    Hydrochlorothiazide 25 Mg Tabs (Hydrochlorothiazide) .Marland Kitchen... 1 by mouth qd Her updated medication list for this problem includes:    Lisinopril-hydrochlorothiazide 20-25 Mg Tabs (Lisinopril-hydrochlorothiazide) .Marland Kitchen... Take 1 pill by mouth daily  Orders: Basic Met-[facility] ([id number]) [facility]- Est Level  3 (99213)  BP today: 116/73 Prior BP: 136/86 (12/08/2008)  Labs Reviewed: K+: 3.8 (11/24/2008) Creat: : 0.90 (11/24/2008)     Problem # 3:  METRORRHAGIA (ICD-626.6) Assessment: Unchanged Will f/u with Dr. Debroah Loop OB/GYN thursday. Will f/u plan with him.  Complete Medication List: 1)  Medroxyprogesterone  .Marland Kitchen.. 1 tab by mouth bid 2)  Iron 325 (65 Fe) Mg Tabs (Ferrous sulfate) .Marland Kitchen.. 1 by mouth bid 3)  Lisinopril-hydrochlorothiazide 20-25 Mg Tabs (Lisinopril-hydrochlorothiazide) .... Take 1 pill by mouth daily 4)  Zyban 150 Mg Xr12h-tab (Bupropion hcl (smoking deter)) .... Take 1 pill daily for 7 days prior to quit date.  then 1 pill by mouth two times a day.  Patient Instructions: 1)  Thank you for seeing me today. 2)  If you have chest pain, difficulty breathing, fevers over 102 that does not get better with tylenol please call us or see a doctor.  3)  Quit date: Friday the 14th.  Stop smoking on that day. Try the nicotine gum as needed after then.  Take the Zyban (buproprion) daily staring this friday and go to twice a day friday the 14th.  4)  Please f/u with me in 6 months or earlier as needed.  Prescriptions: ZYBAN 150 MG XR12H-TAB (BUPROPION HCL (SMOKING DETER)) take 1 pill daily for 7 days prior to quit date.  Then 1 pill by mouth two times a day.  #60 x 1   Entered and Authorized by:   Clementeen Graham MD   Signed by:   Clementeen Graham MD on 01/09/2009   Method used:   Electronically to        [facility] [address] [phone]* (retail)       [address]       [place], [place]  [address]       Ph: [phone] or [phone]       Fax: [phone]   RxID:   [id number] LISINOPRIL-HYDROCHLOROTHIAZIDE 20-25 MG TABS (LISINOPRIL-HYDROCHLOROTHIAZIDE) take 1 pill by mouth daily  #30 x 12   Entered and Authorized by:   Clementeen Graham MD   Signed by:   Clementeen Graham MD on 01/09/2009   Method used:   Electronically to        [facility] [address] [phone]* (retail)       [address]       [place], [place]  [address]       Ph: [phone] or [phone]       Fax: [phone]   RxID:   [id number]   Appended Document: [facility] update    Clinical Lists Changes  Observations: Added new observation of PTPLANMEDMON: take my medicines every day, check my blood pressure, bring all of my medications to every visit (01/10/2009 14:10) Added new observation of HTN PROGRESS: At goal (01/10/2009 14:10) Added new observation of HTN FSREVIEW: Yes (01/10/2009 14:10) Added new observation of DM PROGRESS: N/A (01/10/2009 14:10) Added new observation of DM FSREVIEW: N/A (01/10/2009 14:10) Added new observation of LIPID PROGRS: N/A (01/10/2009 14:10) Added new observation of LIPID FSREVW: N/A (01/10/2009 14:10)       Prevention & Chronic Care Immunizations   Influenza vaccine: Fluvax Non-MCR  (12/08/2008)   Influenza vaccine deferral: Deferred  (11/24/2008)    Tetanus booster: 06/07/1995: Done.   Td booster deferral: Deferred  (12/08/2008)   Tetanus booster due: 06/06/2005    Pneumococcal vaccine: Not documented  Other Screening   Pap smear: normal  (09/06/2008)   Pap smear action/deferral: Deferred  (11/24/2008)   Pap smear due: 09/06/2009   Smoking status: current  (01/09/2009)   Smoking cessation counseling: yes  (11/24/2008)   Target quit date: 12/12/2008  (12/08/2008)  Hypertension   Last Blood Pressure: 116 / 73  (01/09/2009)   Serum creatinine: 0.67  (01/09/2009)   Serum potassium 3.8   (01/09/2009)    Hypertension flowsheet reviewed?: Yes   Progress toward BP goal: At goal  Self-Management Support :   Personal Goals (by the next clinic visit) :      Personal blood pressure goal: 140/90  (11/24/2008)   Patient will work on the following items until the next clinic visit to reach self-care goals:     Medications and monitoring: take my medicines every day, check my blood pressure, bring all of my medications to every visit  (01/10/2009)    Hypertension self-management support: BP self-monitoring log  (11/24/2008)

## 2010-02-07 NOTE — Assessment & Plan Note (Signed)
Summary: sinus pressure,tcb   Vital Signs:  Patient profile:   35 year old female Height:      68 inches Weight:      162.1 pounds BMI:     24.74 Temp:     98.2 degrees F oral Pulse rate:   77 / minute BP sitting:   122 / 85  (right arm) Cuff size:   regular  Vitals Entered By: Garen Grams LPN (January 25, 2009 3:46 PM) CC: sinuses pressure/pain with fever at nightx 4 days, URI symptoms   Primary Care Provider:  Clementeen Graham, MD  CC:  sinuses pressure/pain with fever at nightx 4 days and URI symptoms.  History of Present Illness:       This is a 35 year old woman who presents with URI symptoms.  The patient complains of nasal congestion, purulent nasal discharge, and dry cough, but denies earache and sick contacts.  Associated symptoms include fever of 100.5-103 degrees.  The patient denies stiff neck, dyspnea, and wheezing.  The patient also reports headache.  The patient denies itchy watery eyes and sneezing.  Risk factors for Strep sinusitis include unilateral facial pain and tooth pain.    Current Problems (verified): 1)  Cigarette Smoker  (ICD-305.1) 2)  Metrorrhagia  (ICD-626.6) 3)  Hypertension, Benign Systemic  (ICD-401.1)  Medications Prior to Update: 1)  Medroxyprogesterone .Marland Kitchen.. 1 Tab By Mouth Bid 2)  Iron 325 (65 Fe) Mg Tabs (Ferrous Sulfate) .Marland Kitchen.. 1 By Mouth Bid 3)  Lisinopril-Hydrochlorothiazide 20-25 Mg Tabs (Lisinopril-Hydrochlorothiazide) .... Take 1 Pill By Mouth Daily 4)  Zyban 150 Mg Xr12h-Tab (Bupropion Hcl (Smoking Deter)) .... Take 1 Pill Daily For 7 Days Prior To Quit Date.  Then 1 Pill By Mouth Two Times A Day.  Current Medications (verified): 1)  Medroxyprogesterone .Marland Kitchen.. 1 Tab By Mouth Bid 2)  Iron 325 (65 Fe) Mg Tabs (Ferrous Sulfate) .Marland Kitchen.. 1 By Mouth Bid 3)  Lisinopril-Hydrochlorothiazide 20-25 Mg Tabs (Lisinopril-Hydrochlorothiazide) .... Take 1 Pill By Mouth Daily 4)  Zyban 150 Mg Xr12h-Tab (Bupropion Hcl (Smoking Deter)) .... Take 1  Pill Daily For 7 Days Prior To Quit Date.  Then 1 Pill By Mouth Two Times A Day.  Allergies: No Known Drug Allergies  Past History:  Past Medical History: Last updated: 11/24/2008 [id number] Hypertension since 35 yo off and on HCTZ.  Not well controlled.  Has symptoms of end organ damage Daily light menstural bleeding since 09/2008.  Currently on Progresterone with plans for endormetral ablation. Hx of blood transfusion in 09/2008 due to anemia 2/2 menstural bleeding.  Now on iron.  Past Surgical History: Last updated: 03/05/2006 BTL - 04/06/2004  Family History: Last updated: 11/24/2008 HTN in Mother and Brother  Social History: Last updated: 11/24/2008 Lives with , Jill Side and daughter Genesis.  Divorced from husband. Smokes 1ppd, but had quit for 5 years in the past.   Risk Factors: Smoking Status: current (01/09/2009) Packs/Day: 0.5 (01/09/2009)  Review of Systems  The patient denies anorexia, chest pain, syncope, dyspnea on exertion, hemoptysis, abdominal pain, hematochezia, and severe indigestion/heartburn.    Physical Exam  General:  alert, well-developed, and well-nourished.   Head:  normocephalic and atraumatic.   Ears:  R ear normal and L ear normal.   Nose:  no external deformity, mucosal erythema, mucosal edema, airflow obstruction, and L maxillary sinus tenderness.   Mouth:  good dentition and pharyngeal erythema.   Neck:  supple and no cervical lymphadenopathy.   Lungs:  normal respiratory  effort and normal breath sounds.   Heart:  normal rate, no murmur, and no gallop.   Abdomen:  soft, non-tender, and normal bowel sounds.     Impression & Recommendations:  Problem # 1:  SINUSITIS, ACUTE (ICD-461.9)  Orders: [facility]- Est Level  3 ([address])  Her updated medication list for this problem includes:    Nasonex 50 Mcg/act Susp (Mometasone furoate) .Marland Kitchen... 2 sprays ea nostril daily    Amoxicillin 500 Mg Caps (Amoxicillin) .Marland Kitchen... 1 by mouth three times a  day  Complete Medication List: 1)  Medroxyprogesterone  .Marland Kitchen.. 1 tab by mouth bid 2)  Iron 325 (65 Fe) Mg Tabs (Ferrous sulfate) .Marland Kitchen.. 1 by mouth bid 3)  Lisinopril-hydrochlorothiazide 20-25 Mg Tabs (Lisinopril-hydrochlorothiazide) .... Take 1 pill by mouth daily 4)  Zyban 150 Mg Xr12h-tab (Bupropion hcl (smoking deter)) .... Take 1 pill daily for 7 days prior to quit date.  then 1 pill by mouth two times a day. 5)  Nasonex 50 Mcg/act Susp (Mometasone furoate) .... 2 sprays ea nostril daily 6)  Allegra 180 Mg Tabs (Fexofenadine hcl) .Marland Kitchen.. 1 by mouth daily 7)  Amoxicillin 500 Mg Caps (Amoxicillin) .Marland Kitchen.. 1 by mouth three times a day  Patient Instructions: 1)  Please schedule a follow-up appointment as needed .  Prescriptions: AMOXICILLIN 500 MG CAPS (AMOXICILLIN) 1 by mouth three times a day  #30 x 0   Entered and Authorized by:   Tinnie Gens MD   Signed by:   Tinnie Gens MD on 01/25/2009   Method used:   Electronically to        [facility]. [address] [phone]* (retail)       [address]       [place], [place]  [address]       Ph: [phone] or [phone]       Fax: [phone]   RxID:   [id number] ALLEGRA 180 MG TABS (FEXOFENADINE HCL) 1 by mouth daily  #10 x 1   Entered and Authorized by:   Tinnie Gens MD   Signed by:   Tinnie Gens MD on 01/25/2009   Method used:   Electronically to        [facility]. [address] [phone]* (retail)       [address]       [place], [place]  [address]       Ph: [phone] or [phone]       Fax: [phone]   RxID:   [id number] NASONEX 50 MCG/ACT SUSP (MOMETASONE FUROATE) 2 sprays ea nostril daily  #1 x 1   Entered and Authorized by:   Tinnie Gens MD   Signed by:   Tinnie Gens MD on 01/25/2009   Method used:   Electronically to        [facility]. [address] [phone]* (retail)       [address]       [place], [place]  [address]       Ph: [phone] or [phone]        Fax: [phone]   RxID:   [id number]

## 2010-02-07 NOTE — Assessment & Plan Note (Signed)
Summary: ? bacterial inf,tcb   Vital Signs:  Patient profile:   35 year old female Weight:      164.9 pounds Pulse rate:   75 / minute BP sitting:   115 / 75  (right arm)  Vitals Entered By: Renato Battles slade,cma CC: check for std's  Is Patient Diabetic? No Pain Assessment Patient in pain? no        Primary Care Provider:  Clementeen Graham, MD  CC:  check for std's .  History of Present Illness: Reports boyfriend called her yesterday and stated he had a bacterial infection and she needed to be checked for STD's.  States last unprotected intercourse last Monday.  Denies abnormal vaginal bleeding, vaginal discharge, abdominal pain, burning with urination or fever.  Reports past history of gonorhea and trichmoniasis.  LMP Jan 2011, tubal ligation, hx of endometrial ablation Dec 2010.  Habits & Providers  Alcohol-Tobacco-Diet     Tobacco Status: current  Allergies: No Known Drug Allergies  Past History:  Past Medical History: [id number] Hypertension since 35 yo off and on HCTZ.  Not well controlled.  Has symptoms of end organ damage Daily light menstural bleeding since 09/2008-stopped 02/26/09.  Currently on Progresterone with plans for endormetral ablation. Hx of blood transfusion in 09/2008 due to anemia 2/2 menstural bleeding.  Past Surgical History: BTL - 04/06/2004 Endometrial ablation Dec 2010  Family History: Reviewed history from 11/24/2008 and no changes required. HTN in Mother and Brother  Social History: Reviewed history from 11/24/2008 and no changes required. Lives with , Jill Side and daughter Genesis.  Divorced from husband. Smokes 1ppd, but had quit for 5 years in the past.   Review of Systems General:  Denies chills, fever, malaise, and sweats. ENT:  Denies difficulty swallowing, hoarseness, and sore throat. CV:  Denies chest pain or discomfort, fainting, lightheadness, palpitations, and shortness of breath with exertion. Resp:  Denies chest discomfort, cough,  and shortness of breath. GI:  Denies abdominal pain, nausea, and vomiting. GU:  Denies abnormal vaginal bleeding, discharge, dysuria, hematuria, urinary frequency, and urinary hesitancy. Derm:  Denies rash.  Physical Exam  General:  Well-developed,well-nourished,in no acute distress; alert,appropriate and cooperative throughout examination Mouth:  Oral mucosa and oropharynx without lesions or exudates. Lungs:  Normal respiratory effort, chest expands symmetrically. Lungs are clear to auscultation, no crackles or wheezes. Heart:  Normal rate and regular rhythm. S1 and S2 normal without gallop, murmur, click, rub or other extra sounds. Abdomen:  Bowel sounds positive,abdomen soft and non-tender without masses, organomegaly or hernias noted. No CVA tenderness Genitalia:  Normal introitus for age, no external lesions, scant amount yellow vaginal discharge, mucosa pink and moist, no vaginal or cervical lesions, no vaginal atrophy, no friaility or hemorrhage, normal uterus size and position, no adnexal masses or tenderness Skin:  Intact without suspicious lesions or rashes Cervical Nodes:  No lymphadenopathy noted Axillary Nodes:  No palpable lymphadenopathy Inguinal Nodes:  No significant adenopathy   Impression & Recommendations:  Problem # 1:  CONTACT OR EXPOSURE TO OTHER VIRAL DISEASES (ICD-V01.79) Empiric treatment-Azithromycin 1gm by mouth and Rocephin 250mg  IM in office today. Counseled on STD preventative measures.  Orders: GC/Chlamydia-[facility] (87591/87491) [facility]130[facility] Rocephin  250mg  (Q6578) Azithromycin oral (Q0144) [facility]- Est Level  3 (46962)  Complete Medication List: 1)  Medroxyprogesterone  .Marland Kitchen.. 1 tab by mouth bid 2)  Iron 325 (65 Fe) Mg Tabs (Ferrous sulfate) .Marland Kitchen.. 1 by mouth bid 3)  Lisinopril-hydrochlorothiazide 20-25 Mg Tabs (Lisinopril-hydrochlorothiazide) .... Take  1 pill by mouth daily 4)  Bupropion Hcl 150 Mg Xr12h-tab (Bupropion hcl) .... Take 1/2 daily  for three days then 1 tab daily by mouth every morning 5)  Fluticasone Propionate 50 Mcg/act Susp (Fluticasone propionate) .... 2 sprays each nostril daily  Patient Instructions: 1)  Please schedule a follow-up appointment as needed .  2)  If you could be exposed to sexually transmitted diseases you should use a condom.   Laboratory Results  Date/Time Received: April 17, 2009 11:18 AM  Date/Time Reported: April 17, 2009 11:30 AM   [employer] Source: vaginal WBC/hpf: 10-20 Bacteria/hpf: 3+  Rods Clue cells/hpf: none  Negative whiff Yeast/hpf: none Trichomonas/hpf: none Comments: ...........test performed by...........Marland KitchenTerese Door, CMA     Medication Administration  Injection # 1:    Medication: Rocephin  250mg     Diagnosis: CONTACT OR EXPOSURE TO OTHER VIRAL DISEASES (ICD-V01.79)    Route: IM    Site: RUOQ gluteus    Exp Date: 07/2011    Lot #: [id number]    Mfr: sandoz    Patient tolerated injection without complications    Given by: San Morelle, SMA  Medication # 1:    Medication: Azithromycin oral    Diagnosis: CONTACT OR EXPOSURE TO OTHER VIRAL DISEASES (ICD-V01.79)    Dose: 1g    Route: po    Exp Date: 12/06/2009    Lot #: [id number]    Mfr: pfizer Labs    Patient tolerated medication without complications    Given by: Arlyss Repress CMA, (April 17, 2009 11:45 AM)  Orders Added: 1)  GC/Chlamydia-[facility] [87591/87491] 2)  Mellody Drown Prep- [facility] [62130] 3)  Rocephin  250mg  [J0696] 4)  Azithromycin oral [Q0144] 5)  [facility]- Est Level  3 [86578]

## 2010-02-07 NOTE — Assessment & Plan Note (Signed)
Summary: side pain x 1 wk,df   Vital Signs:  Patient profile:   35 year old female Height:      68 inches Weight:      174 pounds BMI:     26.55 Pulse rate:   72 / minute BP sitting:   155 / 101  (right arm)  Vitals Entered By: Arlyss Repress CMA, (May 15, 2009 9:58 AM) CC: right sided pain radiates from lower back...increased urination x 3-4 days. Is Patient Diabetic? No Pain Assessment Patient in pain? yes     Location: lower back Intensity: 5 Onset of pain  x3-4d   Primary Care Provider:  Clementeen Graham, MD  CC:  right sided pain radiates from lower back...increased urination x 3-4 days.Marland Kitchen  History of Present Illness: Katelyn [name] is here today for an acute visit, c/o R sided flank/RUQ pain, which is intermittent in nature, sharp in character.  Is a 5/10 now, can get worse at other times.  Not associated with nausea or vomiting, however did have one episode of emesis about 4 days ago which she relates to this pain, which improved after vomiting.  No dysuria, has had some mild polyuria in the past 3 to 4 days.  Hasn't been drinking much water, and notes her urine concentrated but not foul smelling or with blood.   No diarrhea or constipation.   LMP in Feb 2011: had "ablation" procedure in Dec 2010 at [facility] for menorrhagia. Is s/p BTL but is concerned about pregnancy despite remote likelihood, and asks for UPreg to be done today.   Denies fevers or chills, denies GERD sxs.  Has resumed smoking 1/2 ppd, due to increased stress. ASks for refill of buproprion for help with this.  Counseled on smoking cessation during this visit.   Habits & Providers  Alcohol-Tobacco-Diet     Tobacco Status: current     Tobacco Counseling: to quit use of tobacco products  Allergies: No Known Drug Allergies  Social History: Lives with , Jill Side and daughter Genesis.  Divorced from husband. Smokes 1ppd, but had quit for 5 years in the past.   May 15, 2009: Is now smoking 1/2 ppd cigarettes. 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  Lives with son (age 10) and daughter (age 99), another child (age 23) in [place].    Physical Exam  General:  well appearing, no apparent distress.  Neck:  No deformities, masses, or tenderness noted. No cervical adenopathy Lungs:  Normal respiratory effort, chest expands symmetrically. Lungs are clear to auscultation, no crackles or wheezes. Heart:  Normal rate and regular rhythm. S1 and S2 normal without gallop, murmur, click, rub or other extra sounds. Abdomen:  Soft, nontender, nondistended. Mild discomfort with deep palpation under the R diaphragm. Negative Murphy's sign.  No masses or megaly.  Normal bowel sounds noted.  Negative for CVA tenderness.  No suprapubic tenderness.  Msk:  No point tenderness over SI joint, vertebral processes in T or L spine, or over trochanteric bursae bilaterally. Full active ROM hips with ab/adduction, flexion/extension, int/ext rotation.  Able to stand on toes and heels without limitation.  Sensation grossly intact in feet.  No edema.  Full active ROM with side-bending bilaterally, also with hip rotation in both directions. Able to bend to touch toes without limitation.  Neurologic:  gait normal.     Impression & Recommendations:  Problem # 1:  ABDOMINAL PAIN, RIGHT UPPER QUADRANT (ICD-789.01) Patient with R flank/RUQ pain, associated lower back pain, that  she notices intermittently (more often standing at her job at [employer] on the third shift).  She does not give an explicit relationship to food or a particular activity, but also does notice it in the mornings after she gets home from her 11P-7A workday and is laying down.  UA today not suggestive of UTI or renal calculus.  Remote possibility CCY as a cause, for which I am ordering transaminases, WBC count and abd [place].  I have told her that I am less suspicious of this than of MSK causes, which leads my differential diagnosis.  We've talked about body mechanics,  use of NSAIDS and increased water intake.  I will call her cell [phone] with results of today's bloodwork and the [place] results.   Orders: Comp Met-[facility] [id number]) CBC-[facility] (09811) Ultrasound (Ultrasound) U Preg-[facility] (91478)  Complete Medication List: 1)  Lisinopril-hydrochlorothiazide 20-25 Mg Tabs (Lisinopril-hydrochlorothiazide) .... Take 1 pill by mouth daily 2)  Bupropion Hcl 150 Mg Xr12h-tab (Bupropion hcl) .... Take 1/2 daily for three days then 1 tab daily by mouth every morning  Other Orders: Urinalysis-[facility] (00000) [facility]- Est Level  3 (29562)  Patient Instructions: 1)  It was a pleasure to see you today.  I believe your right sided flank and back pain is muscular in nature.  I recommend using over-the-counter ibuprofen 200mg  tablets, take 3 to 4 tabs by mouth every 8 hours with food for the next three to five days; you may use it as needed after that.  2)  I recommend good body posture and trying to keep your back straight when lifting things (or your children).  Use your legs to lift (in order to put less strain on your back). 3)  I think it is unlikely that your pain is related to gallbladder disease, but I would like to evaluate for this with blood tests today, as well as an ultrasound of your gallbladder.  4)  I would like you to follow up in the coming 2 to 4 weeks with Dr. Denyse Amass or with me.  Prescriptions: BUPROPION HCL 150 MG XR12H-TAB (BUPROPION HCL) take 1/2 daily for three days then 1 tab daily by mouth every morning  #30 x 3   Entered and Authorized by:   Paula Compton MD   Signed by:   Paula Compton MD on 05/15/2009   Method used:   Electronically to        [facility] [address] [phone]* (retail)       [address]       [place], [place]  [address]       Ph: [phone] or [phone]       Fax: [phone]   RxID:   [id number]   Laboratory Results   Urine Tests  Date/Time Received: May 15, 2009 10:04 AM  Date/Time Reported: May 15, 2009  10:07 AM   Routine Urinalysis   Color: yellow Appearance: Clear Glucose: negative   (Normal Range: Negative) Bilirubin: negative   (Normal Range: Negative) Ketone: negative   (Normal Range: Negative) Spec. Gravity: 1.025   (Normal Range: 1.003-1.035) Blood: negative   (Normal Range: Negative) pH: 6.0   (Normal Range: 5.0-8.0) Protein: negative   (Normal Range: Negative) Urobilinogen: 0.2   (Normal Range: 0-1) Nitrite: negative   (Normal Range: Negative) Leukocyte Esterace: negative   (Normal Range: Negative)    Comments: ...........test performed by...........Marland KitchenTerese Door, CMA     Appended Document: u preg =  negative    Lab Visit  Laboratory Results   Urine Tests  Date/Time Received: May 15, 2009 10:50 AM  Date/Time Reported: May 15, 2009 11:09 AM     Urine HCG: negative Comments: ...............test performed by......Marland KitchenBonnie A. [place]az[place], MLS (ASCP)cm    Orders Today:

## 2010-02-07 NOTE — Assessment & Plan Note (Signed)
Summary: hot flashes with dizziness,tcb   Vital Signs:  Patient profile:   35 year old female Height:      68 inches Weight:      162 pounds BMI:     24.72 Temp:     97.8 degrees F oral Pulse rate:   73 / minute BP sitting:   124 / 83  (left arm) Cuff size:   regular  Vitals Entered By: Tessie Fass CMA (February 26, 2009 11:29 AM) CC: occasional feeling of lightheaded and tingling sensation throughout the body especially the hands, happens several times a day. also has a nervous feeling Is Patient Diabetic? No Pain Assessment Patient in pain? no        Primary Care Provider:  Clementeen Graham, MD  CC:  occasional feeling of lightheaded and tingling sensation throughout the body especially the hands and happens several times a day. also has a nervous feeling.  History of Present Illness: Patient reports feeling episodes of anxiety with left hand tingling and a feeling of inability to catch her breath while at rest since Friday.  Admits to having increased stress at home, states nothing has alleviated symptoms.  Has history of panic attacks in the past for which she use to be on medicaiton but can not remember the name.  Denies chest pain or palpitations with episodes.    Habits & Providers  Alcohol-Tobacco-Diet     Tobacco Status: current     Cigarette Packs/Day: 0.5  Allergies (verified): No Known Drug Allergies  Past History:  Past Medical History: [id number] Hypertension since 35 yo off and on HCTZ.  Not well controlled.  Has symptoms of end organ damage Daily light menstural bleeding since 09/2008-stopped 02/26/09.  Currently on Progresterone with plans for endormetral ablation. Hx of blood transfusion in 09/2008 due to anemia 2/2 menstural bleeding.  Now on iron.  Review of Systems General:  Denies fatigue, fever, malaise, and sweats. CV:  Complains of fainting and lightheadness; denies chest pain or discomfort, difficulty breathing while lying down, palpitations,  shortness of breath with exertion, swelling of feet, and swelling of hands. Resp:  Complains of shortness of breath; denies chest discomfort. Psych:  Complains of anxiety and easily tearful; denies depression, panic attacks, suicidal thoughts/plans, thoughts of violence, unusual visions or sounds, and thoughts /plans of harming others.  Physical Exam  General:  Well-developed,well-nourished,in no acute distress; alert,appropriate and cooperative throughout examination Lungs:  Normal respiratory effort, chest expands symmetrically. Lungs are clear to auscultation, no crackles or wheezes. Heart:  Normal rate and regular rhythm. S1 and S2 normal without gallop, murmur, click, rub or other extra sounds. Pulses:  R and L carotid,radial,femoral,dorsalis pedis and posterior tibial pulses are full and equal bilaterally Neurologic:  alert & oriented X3.   Skin:  Intact without suspicious lesions or rashes Psych:  Cognition and judgment appear intact. Alert and cooperative with normal attention span and concentration. No apparent delusions, illusions, hallucinations   Impression & Recommendations:  Problem # 1:  ANXIETY (ICD-300.00)  Will initiate since patient reports she never started medication (Medicaid will not pay for Zyban).  Education and counseling about dx, medication and expected outcome.  To follow up with primary care doctor in one month.  Her updated medication list for this problem includes:    Bupropion Hcl 150 Mg Xr12h-tab (Bupropion hcl) .Marland Kitchen... Take 1/2 daily for three days then 1 tab daily by mouth every morning  Orders: [facility]- Est Level  2 (45409)  Complete Medication List: 1)  Medroxyprogesterone  .Marland Kitchen.. 1 tab by mouth bid 2)  Iron 325 (65 Fe) Mg Tabs (Ferrous sulfate) .Marland Kitchen.. 1 by mouth bid 3)  Lisinopril-hydrochlorothiazide 20-25 Mg Tabs (Lisinopril-hydrochlorothiazide) .... Take 1 pill by mouth daily 4)  Bupropion Hcl 150 Mg Xr12h-tab (Bupropion hcl) .... Take 1/2 daily for  three days then 1 tab daily by mouth every morning 5)  Fluticasone Propionate 50 Mcg/act Susp (Fluticasone propionate) .... 2 sprays each nostril daily  Patient Instructions: 1)  Take medication as prescribed. 2)  Return if symptoms persist or get worse with treatment.   3)  Follow up with primary doctor in one month. Prescriptions: BUPROPION HCL 150 MG XR12H-TAB (BUPROPION HCL) take 1/2 daily for three days then 1 tab daily by mouth every morning  #30 x 3   Entered and Authorized by:   Luretha Murphy NP   Signed by:   Luretha Murphy NP on 02/26/2009   Method used:   Electronically to        [facility] [address] [phone]* (retail)       [address]       [place], [place]  [address]       Ph: [phone] or [phone]       Fax: [phone]   RxID:   [id number]

## 2010-02-13 NOTE — Progress Notes (Signed)
  Phone Note Outgoing Call   Summary of Call: Called patient to inform that test results were negative.  Reitterated the fact that, if she is having problems with her heart in another week or two she should come back in to discuss this further. Initial call taken by: Majel Homer MD,  February 05, 2010 11:39 AM

## 2010-02-13 NOTE — Assessment & Plan Note (Signed)
Summary: f/u pneum,df   Vital Signs:  Patient profile:   35 year old female Height:      68 inches Weight:      172.2 pounds BMI:     26.28 O2 Sat:      100 % on Room air Temp:     98.4 degrees F oral Pulse rate:   80 / minute BP sitting:   131 / 80  (left arm) Cuff size:   regular  Vitals Entered By: Jimmy Footman, CMA (February 05, 2010 9:20 AM)  Waist Circumference (inches):  Room air CC: pneumonia f/u   Primary Provider:  Clementeen Graham, MD  CC:  pneumonia f/u.  History of Present Illness: Pt was seen two days ago at the emergency room and diagnosed with pneumonia.  She presents for followup.  Pt says that she has had a cold for about the past two weeks.  She also has had problems, during that time period, with some intermittent palpitations but no syncope/LOC.  Two days ago, while at church, she experienced sudden onset shortness of breath accompanied by racing heart rate.  She went to the ED where a CXR showed ? RLL PNA, CBC demonstrated WBCs of  ~14, and an EKG and troponins were unremarkable.  BMET showed a Na of 132 but was otherwise unremarkable.  She was given zithromax and sent home.  Since that time she has had gradual improvement in her work of breathing, although she still does have some problems when she lies down.  She is still having problems with palpitiations 3-5 times per day.  These epsodes are brief and resolve spontaneously.  no chest pain, lower extremity edema, productive cough, fevers, chills, or other systemic symptoms.  Current Medications (verified): 1)  Lisinopril-Hydrochlorothiazide 20-25 Mg Tabs (Lisinopril-Hydrochlorothiazide) .... Take 1 Pill By Mouth Daily 2)  Bupropion Hcl 150 Mg Xr12h-Tab (Bupropion Hcl) .... Take 1/2 Daily For Three Days Then 1 Tab Daily By Mouth Every Morning 3)  Azythromycin 250mg  .... Take Two Pills First Day Then One Pill Daily For 4 Days  Allergies (verified): No Known Drug Allergies  Past History:  Past medical, surgical,  family and social histories (including risk factors) reviewed for relevance to current acute and chronic problems.  Past Medical History: Reviewed history from 04/17/2009 and no changes required. [id number] Hypertension since 35 yo off and on HCTZ.  Not well controlled.  Has symptoms of end organ damage Daily light menstural bleeding since 09/2008-stopped 02/26/09.  Currently on Progresterone with plans for endormetral ablation. Hx of blood transfusion in 09/2008 due to anemia 2/2 menstural bleeding.  Past Surgical History: Reviewed history from 04/17/2009 and no changes required. BTL - 04/06/2004 Endometrial ablation Dec 2010  Family History: Reviewed history from 11/24/2008 and no changes required. HTN in Mother and Brother  Social History: Reviewed history from 05/15/2009 and no changes required. Lives with , Jill Side and daughter Genesis.  Divorced from husband. Smokes 1ppd, but had quit for 5 years in the past.   May 15, 2009: Is now smoking 1/2 ppd cigarettes. 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  Lives with son (age 78) and daughter (age 64), another child (age 83) in [place].    Review of Systems       The patient complains of dyspnea on exertion.  The patient denies anorexia, fever, weight loss, weight gain, vision loss, decreased hearing, hoarseness, chest pain, syncope, peripheral edema, prolonged cough, headaches, hemoptysis, abdominal pain, melena, hematochezia, and severe indigestion/heartburn.  Physical Exam  General:  Well-developed,well-nourished, unconfortable-appearing. Vitals reviewed. Head:  normocephalic and atraumatic.   Neck:  No deformities, masses, or tenderness noted. No cervical adenopathy Lungs:  Normal respiratory effort, chest expands symmetrically. Lungs are clear to auscultation, no crackles or wheezes. Heart:  Normal rate and regular rhythm. S1 and S2 normal without gallop, murmur, click, rub or other extra sounds. Abdomen:  Soft,  nontender, nondistended. Extremities:  No edema.  Legs equal diameter. Skin:  Intact without suspicious lesions or rashes.   Impression & Recommendations:  Problem # 1:  DYSPNEA (ICD-786.05) Pt's dyspnea is resolving and her O2 sats are fine here in clinic.  Feel the call of PNA on CXR is slightly soft and that the patient should be considered for a PE.  This is relatively low likelyhood as the patient's only risk factor is smoking, but will obtain a d-dimer for this.  Would plan a CTA if this is positive.  Patient encouraged to quit smoking and to finish her antibiotics.  Return if patient continues to have problems after 7-10 days.  Orders: D-Dimer- [facility] ([id number]) 12 Lead EKG (12 Lead EKG) [facility]- Est  Level 4 (09811)  Problem # 2:  PALPITATIONS (ICD-785.1) Repeat EKG today demonstrates, once again, NSR, no acute ST segment changes or changes worrisome for ischemia.  Patient is non tachycardic.  Instruct patient to continue to monitor this.  If it is still present after resolution of respiratory symptoms she would probably benefit from a halter monitor.  Pt instructed to return if signs persist.  [id number][facility] 12 Lead EKG (12 Lead EKG) [facility]- Est  Level 4 (99214)  Problem # 3:  HYPONATREMIA (ICD-276.1) Will recheck Na today to assure resolution.  Orders: Basic Met-[facility] ([id number]) [facility]- Est  Level 4 (96295)  Complete Medication List: 1)  Lisinopril-hydrochlorothiazide 20-25 Mg Tabs (Lisinopril-hydrochlorothiazide) .... Take 1 pill by mouth daily 2)  Bupropion Hcl 150 Mg Xr12h-tab (Bupropion hcl) .... Take 1/2 daily for three days then 1 tab daily by mouth every morning 3)  Azythromycin 250mg   .... Take two pills first day then one pill daily for 4 days  Patient Instructions: 1)  It was great to see you today! 2)  Your EKG today looks normal. 3)  Keep taking your antibiotic. 4)  I will call you with the results of your blood test.  As we discussed, it would  be best for you to stay here and wait for the results, but I understand that you have other things that you have do today.  I will call you at [phone] when I get the results.  If they are positive, we will need to get a CT scan, so please stay close to the hospital. 5)  If you are still having problems with your heart in about a week and a half, make another appointment and we will set you up to see cardiology.   Orders Added: 1)  D-Dimer- [facility] [[id number]] 2)  12 Lead EKG [12 Lead EKG] 3)  Basic Met-[facility] [[id number]] 4)  [facility]- Est  Level 4 [03474]

## 2010-04-08 LAB — WET PREP, GENITAL
Trich, Wet Prep: NONE SEEN
Yeast Wet Prep HPF POC: NONE SEEN

## 2010-04-08 LAB — POCT URINALYSIS DIP (DEVICE)
Glucose, UA: NEGATIVE mg/dL
Nitrite: NEGATIVE

## 2010-04-08 LAB — POCT PREGNANCY, URINE: Preg Test, Ur: NEGATIVE

## 2010-04-09 LAB — CBC
HCT: 43.6 % (ref 36.0–46.0)
Hemoglobin: 14.8 g/dL (ref 12.0–15.0)
MCV: 89.4 fL (ref 78.0–100.0)
Platelets: 275 10*3/uL (ref 150–400)
RDW: 14.5 % (ref 11.5–15.5)

## 2010-04-09 LAB — BASIC METABOLIC PANEL
BUN: 12 mg/dL (ref 6–23)
Creatinine, Ser: 0.76 mg/dL (ref 0.4–1.2)
GFR calc non Af Amer: >60 mL/min (ref >60)
Glucose, Bld: 64 mg/dL — ABNORMAL LOW (ref 70–99)
Potassium: 3.7 mEq/L (ref 3.5–5.1)

## 2010-04-09 LAB — PREGNANCY, URINE: Preg Test, Ur: NEGATIVE

## 2010-04-12 LAB — BASIC METABOLIC PANEL
BUN: 7 mg/dL (ref 6–23)
Chloride: 106 mEq/L (ref 96–112)
Creatinine, Ser: 0.65 mg/dL (ref 0.4–1.2)
GFR calc non Af Amer: >60 mL/min (ref >60)
Glucose, Bld: 98 mg/dL (ref 70–99)

## 2010-04-12 LAB — CROSSMATCH
ABO/RH(D): B POS
ABO/RH(D): B POS
Antibody Screen: NEGATIVE
Antibody Screen: NEGATIVE

## 2010-04-12 LAB — IRON AND TIBC: UIBC: <55 ug/dL

## 2010-04-12 LAB — DIFFERENTIAL
Basophils Absolute: 0.2 10*3/uL — ABNORMAL HIGH (ref 0.0–0.1)
Eosinophils Absolute: 0.4 10*3/uL (ref 0.0–0.7)
Lymphs Abs: 1.3 10*3/uL (ref 0.7–4.0)
Monocytes Relative: 6 % (ref 3–12)

## 2010-04-12 LAB — CBC
MCV: 64.4 fL — ABNORMAL LOW (ref 78.0–100.0)
Platelets: 824 10*3/uL — ABNORMAL HIGH (ref 150–400)
Platelets: 761 10*3/uL — ABNORMAL HIGH (ref 150–400)
RDW: 32.1 % — ABNORMAL HIGH (ref 11.5–15.5)
WBC: 10.9 10*3/uL — ABNORMAL HIGH (ref 4.0–10.5)
WBC: 8.9 10*3/uL (ref 4.0–10.5)

## 2010-04-12 LAB — URINALYSIS, ROUTINE W REFLEX MICROSCOPIC
Nitrite: NEGATIVE
Specific Gravity, Urine: 1.016 (ref 1.005–1.030)
Urobilinogen, UA: 0.2 mg/dL (ref 0.0–1.0)
pH: 6 (ref 5.0–8.0)

## 2010-04-12 LAB — GC/CHLAMYDIA PROBE AMP, GENITAL: GC Probe Amp, Genital: NEGATIVE

## 2010-04-12 LAB — RETICULOCYTES
RBC.: 2.28 MIL/uL — ABNORMAL LOW (ref 3.87–5.11)
Retic Count, Absolute: 25.1 10*3/uL (ref 19.0–186.0)
Retic Ct Pct: 1.1 % (ref 0.4–3.1)

## 2010-04-12 LAB — URINE MICROSCOPIC-ADD ON

## 2010-04-12 LAB — FERRITIN: Ferritin: 4 ng/mL — ABNORMAL LOW (ref 10–291)

## 2010-04-12 LAB — FOLATE: Folate: 7.6 ng/mL

## 2010-04-12 LAB — VITAMIN B12: Vitamin B-12: 502 pg/mL (ref 211–911)

## 2010-05-22 ENCOUNTER — Ambulatory Visit (INDEPENDENT_AMBULATORY_CARE_PROVIDER_SITE_OTHER): Payer: Self-pay | Admitting: Family Medicine

## 2010-05-22 ENCOUNTER — Encounter: Payer: Self-pay | Admitting: Family Medicine

## 2010-05-22 DIAGNOSIS — A499 Bacterial infection, unspecified: Secondary | ICD-10-CM

## 2010-05-22 DIAGNOSIS — R109 Unspecified abdominal pain: Secondary | ICD-10-CM | POA: Insufficient documentation

## 2010-05-22 DIAGNOSIS — I10 Essential (primary) hypertension: Secondary | ICD-10-CM

## 2010-05-22 DIAGNOSIS — B9689 Other specified bacterial agents as the cause of diseases classified elsewhere: Secondary | ICD-10-CM

## 2010-05-22 DIAGNOSIS — N760 Acute vaginitis: Secondary | ICD-10-CM | POA: Insufficient documentation

## 2010-05-22 LAB — POCT WET PREP (WET MOUNT): Trichomonas Wet Prep HPF POC: NEGATIVE

## 2010-05-22 MED ORDER — POLYETHYLENE GLYCOL 3350 17 GM/SCOOP PO POWD: 17.0000 g | Freq: Two times a day (BID) | ORAL | Status: AC

## 2010-05-22 MED ORDER — BISACODYL 10 MG RE SUPP: 10.0000 mg | RECTAL | Status: AC | PRN

## 2010-05-22 MED ORDER — LISINOPRIL-HYDROCHLOROTHIAZIDE 20-25 MG PO TABS: 1.0000 | ORAL_TABLET | Freq: Every day | ORAL | Status: DC

## 2010-05-22 MED ORDER — METRONIDAZOLE 500 MG PO TABS: 500.0000 mg | ORAL_TABLET | Freq: Two times a day (BID) | ORAL | Status: AC

## 2010-05-22 NOTE — Patient Instructions (Signed)
Thank you for coming in today. Try the miralax twice a day and use the supps as needed.  Come back in about 1-2 months to follow up and to talk about anxiety.

## 2010-05-22 NOTE — Progress Notes (Signed)
ABDOMINAL PAIN  Location: LLQ Onset: Weeks  Radiation: None Severity: Mild Quality: Sharp and intermintant Duration: 30-60 mins Better with: BMs Worse with: Nothing.    Symptoms Nausea/Vomiting: no  Diarrhea: no  Constipation: yes, BM q week or so. 2 on Bristol Stool scale.   Melena/BRBPR: no  Hematemesis: no  Anorexia: no  Fever/Chills: no  Dysuria: no  Rash: no  Wt loss: no  EtOH use: no  NSAIDs/ASA: no  LMP: 1 year ago. Had endometrial ablation 1 year ago.  Vaginal bleeding: no  STD risk/hx: no   Past Surgeries: No sig abd surg  PMH reviewed.  ROS as above otherwise neg.  Exam:  Vs noted.  Gen: Well NAD HEENT: EOMI , MMM Lungs: CTABL Nl WOB Heart: RRR no MRG Abd: NABS, NT, ND. Mild fullness in abd. No rebound or guarding GYN: Normal ext genitalia, no cervical motion tenderness, no discharge. Normal appearing cervix. No adnexal mass or tenderness. Normal sized uterus.  Exts: Non edematous BL  LE

## 2010-05-22 NOTE — Assessment & Plan Note (Signed)
DX on wet prep. Treat with Flagyl as has abd pain.

## 2010-05-22 NOTE — Assessment & Plan Note (Signed)
Think due to constipation, however GU cause is not excluded.  Plan: Await GC/CHL results, and treat BV. Also treat constipation with miralax and bisacodyl prn.  F/u in 1 month and re-assess. Red flags reviewed. Pt expresses understanding.

## 2010-05-23 LAB — GC/CHLAMYDIA PROBE AMP, GENITAL: Chlamydia, DNA Probe: NEGATIVE

## 2010-05-24 NOTE — Op Note (Signed)
NAME:  Katelyn [name], Katelyn [name] NO.:  [id number]   MEDICAL RECORD NO.:  [id number]          PATIENT TYPE:  WOC   LOCATION:  WOC                          FACILITY:  [facility]   PHYSICIAN:  Lesly Dukes, M.D. DATE OF BIRTH:  August 14, 1975   DATE OF PROCEDURE:  04/27/2004  DATE OF DISCHARGE:                                 OPERATIVE REPORT   PREOPERATIVE DIAGNOSIS:  Multiparity desiring sterility.   POSTOPERATIVE DIAGNOSIS:  Multiparity desiring sterility.   PROCEDURE:  Postpartum bilateral tubal ligation.   SURGEON:  Dr. Elsie Lincoln   ANESTHESIA:  Epidural.   ESTIMATED BLOOD LOSS:  Minimal.   SPECIMENS/PATHOLOGY:  None.   COMPLICATIONS:  None.   DESCRIPTION OF PROCEDURE:  After informed consent was obtained, the patient  was taken to the operating room where epidural anesthesia was found to be  adequate.  The patient in the dorsal supine position and prepared and draped  in normal sterile fashion.  The bladder was in-and-out catheterized.  An  infraumbilical skin incision was made with a scalpel and carried down to  layer of fascia.  The fascia was incised and extended both superiorly and  inferiorly.  The peritoneum was identified and entered bluntly with a  hemostat.  Retractors were placed into the abdomen, and the patient was  placed in Trendelenburg.  The right fallopian tube was then identified,  tented up with a Babcock, and followed out to its fimbriated end.  A Filshie  clip was placed completely across the right fallopian tube.  Good hemostasis  was noted.  The right fallopian tube was returned to the abdomen.  The left  fallopian tube was then identified, tented up with a Babcock.  A Filshie  clip was placed completely across the left fallopian tube.  Good hemostasis  was noted.  The left fallopian tube was returned to the abdomen.  The fascia  was then closed with 0 Vicryl in a running fashion.  Good hemostasis was  noted.  The skin was closed with 3-0  Vicryl in a subcuticular fashion.  Good  hemostasis was noted.  A small amount of Dermabond was placed in the center  of the incision, as the edges were not well reapproximated with the  subcuticular stitch.  Marcaine 0.25% 7 mL was injected locally in order to  aid in pain relief after the surgery.  The patient tolerated the procedure  well.  Sponge, lap, instrument, and needle count were correct x 2.  The  patient went to the recovery room in stable condition.      KHL/MEDQ  D:  04/27/2004  T:  04/27/2004  Job:  [id number]

## 2010-05-24 NOTE — Discharge Summary (Signed)
NAME:  Katelyn Katelyn [name], Katelyn [name] NO.:  [id number]   MEDICAL RECORD NO.:  [id number]                   PATIENT TYPE:  EMS   LOCATION:  MAJO                                 FACILITY:  [facility]   PHYSICIAN:  Bradly Bienenstock, M.D.                   DATE OF BIRTH:  January 05, 1976   DATE OF ADMISSION:  07/24/2001  DATE OF DISCHARGE:  07/28/2001                                 DISCHARGE SUMMARY   DISCHARGE DIAGNOSES:  1. Intrauterine pregnancy at 37 weeks, delivered.  2. Preeclampsia.   DISCHARGE MEDICATIONS:  1. Prenatal vitamins.  2. Ortho-Evra patch.  3. Motrin 600 mg one tablet p.o. t.i.d.   FOLLOW UP:  The patient is to follow up in six weeks at [facility].   PROCEDURE:  [facility].   HISTORY OF PRESENT ILLNESS:  This is a 35 year old G-5, P-1-0-3-1 who  presents at 37 weeks 1 day gestation by last menstrual period plus 17-week  ultrasound which reports sharp intermittent headache, flashing of light in  visual field and numbness in fingers.  No right upper quadrant pain.  Good  fetal movement.  She has been followed up at [facility] for her prenatal  course.   MEDICATIONS ON ADMISSION:  Only prenatal vitamins.   ALLERGIES:  No known drug allergies.   OBSTETRICAL HISTORY:  One prior NSCDA in 1995.  Three therapeutic abortions.   GYN HISTORY:  __________ during October 2002.  GC in 1994.   SOCIAL HISTORY:  A 1/2 pack a day smoker.  No alcohol.  No drugs.   PRENATAL LABS:  B positive blood type.  Negative antibody screen.  Hemoglobin was 11.7.  Platelets 212.  Rubella not immune. Hepatitis B  antigen was negative.  Syphilis, HIV, GC and Chlamydia  were all negative.  Group B streptococcus positive on 07/20/01.   PHYSICAL EXAMINATION:  On examination the patient was in no acute distress.  Cervical exam showed a 2 cm dilated, 40% effaced, -2, soft midline.  Fetal  heart tones were baseline 151 to 160 and reactive.  The patient was not  contracting.   The patient's PIH demonstrated uric acid of 4.5.  LDH 112.  Liver enzymes which were normal.  Platelets of 192.  Blood pressure on  admission was initially 166/116.   HOSPITAL COURSE:  The patient was admitted for induction of labor and for  management of pregnancy-induced hypertension.  Labor was induced.  She was  placed on magnesium sulfate 5 gram bolus and then 2 grams an hour and was  treated with IV penicillin for group B streptococcus positive status.  She  was delivered on 07/25/01 of a viable female fetus.  After delivering her  infant she was moved to the adult ICU for monitoring with her preeclampsia  and after delivery her blood pressures went down.  She had adequate diuresis  and was taken off  the magnesium for 24 hours postpartum.   The patient was stable on day of discharge.  Prior to discharge the patient  was given a rubella vaccine.  She planned on bottle feeding and wanted Ortho-  Evra patch for birth control.                                               Bradly Bienenstock, M.D.    Katelyn Katelyn [name] Katelyn [name]  D:  10/21/2001  T:  10/21/2001  Job:  [id number]

## 2010-05-24 NOTE — Discharge Summary (Signed)
NAME:  Katelyn [name], SATTERLY NO.:  [id number]   MEDICAL RECORD NO.:  [id number]          PATIENT TYPE:  INP   LOCATION:  9153                          [facility]   PHYSICIAN:  Lesly Dukes, M.D. DATE OF BIRTH:  [birth year]   DATE OF ADMISSION:  02/04/2004  DATE OF DISCHARGE:  02/06/2004                                 DISCHARGE SUMMARY   ADMISSION DIAGNOSES:  1.  26 6/7 weeks intrauterine pregnancy.  2.  Mild chronic hypertension.  3.  Threatened preterm labor.   DISCHARGE DIAGNOSES:  1.  27 1/7 week intrauterine pregnancy.  2.  Preterm labor stabilized.  3.  Mild chronic hypertension.   HISTORY:  This is a 35 year old, G6, P2-0-3-2 who presented at 17 6/7 weeks  with chief concern of contractions.  She denied rupture of membranes or  vaginal bleeding and had good fetal movement but less than it had been the  day before. She related having contractions following sexual intercourse  earlier in the day and was not having severe abdominal pain, denied urinary  tract infection symptoms or vaginitis symptoms.  Her prenatal care was at  [facility], first trimester onset. Her risk factors were a history of  preeclampsia of previous two pregnancies and also her chronic hypertension.  She had an otherwise uncomplicated pregnancy.   MEDICATIONS:  Prenatal vitamins and she had no allergies.   OB HISTORY:  Significant for three elective AB's and she had a term vaginal  delivery at 39 weeks and at 40 weeks with preeclampsia.   GYN HISTORY:  Negative for __________.   MEDICAL AND SURGICAL HISTORY:  Noncontributory.   FAMILY HISTORY:  Mother hypertension, grandparents diabetic, negative  tobacco, alcohol and drugs.   SIGNIFICANT PRENATAL LABS:  Hemoglobin 12.5, negative GBS on January 07, 2004  and a one hour GTT that was 102.   PHYSICAL EXAMINATION:  ADMISSION VITAL SIGNS:  Temperature 98, blood  pressure 147/82.  GENERAL:  She was in no distress.  HEART:   Regular rate and rhythm without murmur.  LUNGS:  CTA.  ABDOMEN:  Soft, nontender, consistent with 26 week size.  EXTREMITIES:  Without edema. She had 1+ DTR's.  CERVICAL EXAM:  She was closed thick, high posterior.  Fetal heart rate  baseline was 140, reactive with good variability and no decels. She was not  having contractions __________.   LABORATORY DATA:  Her urinalysis was negative. She did have an ultrasound  with an AFI of 12.2 and good growth, 50th percentile.  Her cervix was  slightly shortened at 2.7 cm.  She had a beta strep done that was  subsequently negative.   Her chemistries were essentially normal.   HOSPITAL COURSE:  The patient was admitted and given betamethasone for fetal  lung maturity. She did have a fetal fibronectin on day one which was  subsequently negative.  On day two, she was started on Procardia 30 mg, 1  p.o. b.i.d.  Her blood pressures normalized on bed rest.  On January 31, she  was not feeling any contractions, had good movement and her exam of  the  cervix was unchanged.  It was found to be posterior, soft, closed, slightly  short and -1.  Fetal heart rate was in the 150's with 10 beats per minute  accelerations.  She was felt to be stable on Procardia with a somewhat weak  cervix, also consistent with her history of three elective AB's.  Her  borderline chronic hypertension was stable. She was discharged home with  followup in one week at the high risk clinic. She was to be on home bed rest  until then and to stay out of school.   DISCHARGE MEDICATIONS:  1.  Prenatal vitamins 1 p.o. q.d.  2.  Procardia 30 mg XL 1 p.o. b.i.d.      DP/MEDQ  D:  03/14/2004  T:  03/15/2004  Job:  [id number]

## 2010-05-24 NOTE — Group Therapy Note (Signed)
NAME:  Katelyn [name], Katelyn [name] NO.:  [id number]   MEDICAL RECORD NO.:  [id number]                   PATIENT TYPE:  OUT   LOCATION:  [facility]                           FACILITY:  [facility]   PHYSICIAN:  Argentina Donovan, MD                     DATE OF BIRTH:  09-Feb-1975   DATE OF SERVICE:  12/22/2002                                    CLINIC NOTE   REASON FOR VISIT:  The patient is a 35 year old gravida 5, para 2-0-3-2 who  has been on Depo-Provera since the birth of her baby a year ago last July.  In the recent past, she has had pain on the right lower quadrant much worse  during intercourse and recently she has had breakthrough bleeding on her  Depo-Provera although she has been getting her injections on time.  Ultrasound was ordered which showed a 3.4 x 2.9 x 3.4 right ovarian simple  cyst.  The problem was discussed with the patient concerning her pain as  well as her dyspareunia and we discussed alternatives to the Depo-Provera  since she has had mild hypertension and has had trouble losing weight  although her weight has not varied very much and at 5 feet 7 inches, 175  pounds since the birth of her baby.  We have come to the decision that we  will try her on a NuvaRing.  We have discussed the different ways of using a  NuvaRing.  We have also discussed the physiology of the ovary and the cause  of pain on the right side as well as changing positions with coitus in order  to alleviate that and keeping a record of when the pain tends to be worse on  a calendar in order to be able to modify coital positions.  The patient  seems willing to try these things and to discuss them with her partner and  we will go ahead and see how this works out.   IMPRESSION:  Small symptomatic ovarian cyst on the right with dyspareunia  and pelvic pain, breakthrough bleeding on Depo-Provera.   PLAN:  To start the NuvaRing.                                               Argentina Donovan,  MD    PR/MEDQ  D:  12/22/2002  T:  12/23/2002  Job:  [id number]

## 2010-05-24 NOTE — Discharge Summary (Signed)
NAME:  Katelyn [name], Katelyn [name] NO.:  [id number]   MEDICAL RECORD NO.:  [id number]          PATIENT TYPE:  INP   LOCATION:                                [facility]   PHYSICIAN:  Rodolph Bong, M.D.     DATE OF BIRTH:  Dec 06, 1975   DATE OF ADMISSION:  04/25/2004  DATE OF DISCHARGE:  04/28/2004                                 DISCHARGE SUMMARY   PRIMARY MEDICAL PHYSICIAN:  [facility].   DISCHARGE DIAGNOSES:  32.  A 35 year old G6, now P3-0-3-3, status post spontaneous vaginal delivery      of a viable female infant.  2.  Status post bilateral tubal ligation.   MEDICATIONS:  1.  Ibuprofen 600 mg 1 tablet p.o. q.6h. p.r.n. pain.  2.  Percocet 5/325 1-2 tablets p.o. q.6h. p.r.n. more severe pain.  3.  Prenatal vitamins daily.  4.  Ferrous sulfate 325 mg tablet daily.   ADMISSION HISTORY:  [name] was initially a 34 year old G6, P2-0-3-2,  who presented at 38-1/[redacted] weeks gestation with early labor.  She was also  found to be hypertensive at 145/85 but known to have a possible history of  chronic hypertension.  She was admitted for expectant delivery.   HOSPITAL COURSE:  [name] was admitted with a history as outlined  above.  She underwent a fairly uncomplicated hospital course.  At admission,  she was found to be about 6 cm dilated and was placed on ampicillin therapy  for positive GBS status.  She was also given Pitocin due to some contraction  spacing.  She progressed nicely and at 7:15 on the morning of April 26, 2004, she delivered a viable female infant who weighed 6 pounds 15 ounces.  Apgars were 9 at 1 minute and 9 at 5 minutes.  There were no complications  peripartum.  On April 27, 2004, [name] underwent bilateral tubal  ligation secondary to multiparity and desiring sterility.  This is dictated  in a separate procedural note.  On the morning of April 23, [name] was  feeling well with no complications.  Will discharge her  home on the  medications as listed above.   DIET:  Regular.   ACTIVITY:  Per instructional booklet.  She is on pelvic rest for 6 weeks.   FOLLOW UP:  [facility].      AK/MEDQ  D:  04/28/2004  T:  04/28/2004  Job:  [id number]

## 2010-05-24 NOTE — Discharge Summary (Signed)
NAME:  Katelyn [name], RIDDELL NO.:  [id number]   MEDICAL RECORD NO.:  [id number]          PATIENT TYPE:  WOC   LOCATION:  WOC                          FACILITY:  [facility]   PHYSICIAN:  Conni Elliot, M.D.DATE OF BIRTH:  Jul 31, 1975   DATE OF ADMISSION:  04/25/2004  DATE OF DISCHARGE:  05/02/2004                                 DISCHARGE SUMMARY   ADMISSION DIAGNOSES:  1.  Preeclampsia.  2.  Chronic hypertension.   DISCHARGE DIAGNOSES:  1.  Preeclampsia.  2.  Chronic hypertension.   DISCHARGE MEDICATIONS:  1.  Metoprolol 50 mg 1 p.o. b.i.d.  2.  Hydrochlorothiazide 25 mg 1 p.o. daily.   FOLLOW UP:  1.  Recheck of blood pressure with home health in 2 days.  2.  Follow up with Dr. Providence Lanius, [facility] for establishment      of primary care and follow up of blood pressure.   HOSPITAL COURSE:  This is a 35 year old African-American female, who  presented to the [facility] postpartum day #4 for a spontaneous vaginal delivery  with a bilateral tubal ligation, complaining of some dizziness and  generalized feeling of not doing well.  The patient was found to have blood  pressures that were 130s-160s/90s-100s.  She was admitted, started on  magnesium at 2 g/hr.  She was increased to 3 g/hr to achieve therapeutic  doses.  On day after admission, the patient had experienced some mild  frontal headache as well as some chest pain.  Cardiac enzymes were drawn,  found to be within normal limits, and an EKG was also normal.  On day prior  to discharge, the magnesium was discontinued.  The patient had great urine  output with a 24 hour total of negative 2220 over a 24 hour period.  Blood  pressures on day of discharge ranged from 130-155/72-78.  The patient had no  further complaints, did well also on hydrochlorothiazide and metoprolol and  was discharged on the same medications.  Talking to the patient, she does  not have a primary care physician and will be following up  with Dr. Providence Lanius  at  [facility] in approximately 2 weeks, not only for her blood  pressure but just for general establishment of primary care.   DISCHARGE CONDITION:  Good.      TH/MEDQ  D:  05/02/2004  T:  05/02/2004  Job:  [id number]   cc:   Devra Dopp, MD  Fax: [phone]

## 2010-05-24 NOTE — Group Therapy Note (Signed)
NAME:  Katelyn [name], Katelyn [name] NO.:  [id number]   MEDICAL RECORD NO.:  [id number]                   PATIENT TYPE:  OUT   LOCATION:  [facility]                           FACILITY:  [facility]   PHYSICIAN:  Tinnie Gens, MD                     DATE OF BIRTH:  [birth year]   DATE OF SERVICE:  10/07/2002                                    CLINIC NOTE   CHIEF COMPLAINT:  Right-sided pelvic pain for the last four to five months.   HISTORY OF PRESENT ILLNESS:  The patient is a 35 year old gravida 5, para 2-  0-3-2, who has a history since May of sharp right-sided pelvic pain that  seems to be much worse with intercourse.  She is otherwise not really having  symptoms, and she reports that she takes no medications or nothing seems to  improve it except not having intercourse, but she does note that the pain is  worse with menses.  The patient has been followed for gynecological care at  [facility] in [place], and she has been on Depo for awhile.  She is  having periods again, however, and they seem to be cyclic, coming every 28-  30 days, and her periods last for about seven days.  They are moderate to  heavy in nature, and she does have severe pain with them.   PAST MEDICAL HISTORY:  Significant for hypertension requiring medication  through pregnancy, but she currently does not have a primary care doctor and  has not been being treated for this in awhile.  She has had no previous  surgeries.   MEDICATIONS:  Currently include amoxicillin.   ALLERGIES:  None.   FAMILY HISTORY:  Significant for hypertension, coronary artery disease, and  diabetes.   SOCIAL HISTORY:  She is a nonsmoker, nondrinker.  She lives with her husband  and son.   OBSTETRICAL HISTORY:  She is a G5, P2.  She had two spontaneous vaginal  deliveries, three terminations.   GYNECOLOGICAL HISTORY:  Menarche at age 12.  Cycles are regular, last seven  days.  Severe pain with them.  She is  currently on Depo-Provera.  Her Pap  smear is scheduled for next week.  She has no history of abnormal Paps.   REVIEW OF SYSTEMS:  A 14-point review of systems is reviewed and negative  except for some chest pain and shortness of breath, possibly associated with  nerves and some worry.  She thinks this is general anxiety.  She denies  frank depression.  Otherwise, she denies any other symptoms except for  possible vaginal odor with discharge.   PHYSICAL EXAM:  GENERAL:  Well-developed, well-nourished black female in no  acute distress.  ABDOMEN:  Soft, nontender, nondistended.  GYN:  She has normal external female genitalia.  The vagina is rugated and  has a scant amount of white discharge.  The cervix  was visualized and was  noted without lesions.  There is no erythema to it or friability.  The  uterus is very small, anteverted.  There are no adnexal masses.  There is no  adnexal tenderness.   IMPRESSION:  Right-sided pelvic pain, questionable etiology.  Plan:  Will  get ultrasound to see if she has a cyst over there and that is why she is  having pain.  Endometriosis is likely to be shutdown on Depo-Provera, so I  think this was much less likely.                                               Tinnie Gens, MD    TP/MEDQ  D:  10/07/2002  T:  10/07/2002  Job:  [id number]

## 2010-05-24 NOTE — Discharge Summary (Signed)
NAME:  Katelyn [name], Katelyn [name]                          ACCOUNT NO.:  [id number]   MEDICAL RECORD NO.:  [id number]                   PATIENT TYPE:  INP   LOCATION:  9179                                 FACILITY:  [facility]   PHYSICIAN:  Enid Cutter, M.D.                  DATE OF BIRTH:  August 04, 1975   DATE OF ADMISSION:  08/02/2001  DATE OF DISCHARGE:  08/04/2001                                 DISCHARGE SUMMARY   DISCHARGE DIAGNOSES:  1. Hypertension.  2. History of preeclampsia.  3. Postpartum of normal spontaneous vaginal delivery on 07/25/2001.   DISCHARGE MEDICATIONS:  1. Hydrochlorothiazide 25 mg p.o. q.d.   FOLLOW UP:  The patient is to follow up in one week for a blood pressure  check, maternity admissions, and she is to follow up in six weeks for  postpartum visit at [facility].   PRESENTING HISTORY:  This is a 35 year old, G-5, P-2-0-3-2, who presented to  Maternity Admissions after home health had seen her and her infant on a  postpartum visit, and discovered the patient's blood pressures to be in the  high 160's over 110's.  This was called to the attending physician, Dr.  Orlene Erm, who advised that the patient should come in to be checked.  The  patient had a history of having had preeclampsia with this last delivery,  although really just diagnosed by hypertension alone, no proteinuria and no  abnormal laboratory values at the time of her delivery.  She had been  maintained on 36 hours of magnesium postpartum, but when she was discharged  from the hospital, her systolic blood pressures ranged between 130 and 160,  and her diastolics between 45 and 92.  The patient was asymptomatic with the  hypertension.  She denied any headaches, visual field changes or right upper  quadrant pain, and came in, solely on referral from home health.   PAST MEDICAL HISTORY:  Her past medical history was negative.   FAMILY HISTORY:  She does have a strong family history of hypertension; both  parents are hypertensive.   SOCIAL HISTORY:  The patient smokes a half a pack per day.  No alcohol.  No  drugs.   PHYSICAL EXAMINATION:  VITALS: On presentation, temperature 99.2, pulse 72,  respiratory rate 20, blood pressure 158/108.  GENERAL: Generally, she is an alert, pleasant, conversational female in no  acute distress.  LUNGS: Lungs are clear to auscultation bilaterally.  HEART:  Regular rate and rhythm without murmur.  NEURO: Deep tendon reflexes are 1 to 2 bilaterally.  ABDOMEN: Soft, nontender and nondistended.  EXTREMITIES: 1+ edema of feet bilaterally.   LABS ON PRESENTATION:  White count 8.4, hemoglobin 11.5, platelets 297.  Uric acid 8.4, LDH 257.  Sodium 141, potassium 3.8, chloride 110, bicarb 25,  BUN 10, creatinine 0.7, glucose 82, calcium 8.9, total protein 6.4, albumin  3.1, alkaline phosphatase 67, AST  16, ALT 30.  Negative urine protein.   ASSESSMENT:  Assessment at that time was possible preeclampsia,  recurrent/exacerbation with increased blood pressure and increased uric  acid.  The patient did have a normal uric acid at 4.3 on her discharge from  the hospital; therefore, she was admitted for 23 hour observation.   HOSPITAL COURSE:  The patient was admitted and placed on magnesium and  watched for 24 hours on the magnesium.  After being on the magnesium, the  patient did diurese quite briskly.  She was, after 24 hours, negative three  liters, continued to be asymptomatic from all fronts.  The patient's blood  pressure did go down somewhat while she was on the magnesium, and it was  stopped on the afternoon of the 29th.   On the 30th, the patient was doing well.  Her blood pressures were  continuing to be high, average 160/100, and therefore, she was started on  hydrochlorothiazide for her hypertension.  As she was asymptomatic, she was  discharged home with instructions that as she had another home health visit,  they were going to check her blood  pressure tomorrow and then return to  Maternity Admissions in one week.  She was strongly encouraged to follow up  with [place] for this, because it was unclear whether she was going to turn into  a chronic hypertensive, or if this hydrochlorothiazide might just be a short-  term necessity in this patient.     Katelyn [name], M.D.                         Enid Cutter, M.D.    Katelyn [name]  D:  08/04/2001  T:  08/10/2001  Job:  [id number]

## 2010-07-25 ENCOUNTER — Ambulatory Visit (HOSPITAL_COMMUNITY)
Admission: RE | Admit: 2010-07-25 | Discharge: 2010-07-25 | Disposition: A | Discharge status: Home / Self Care | Payer: Medicaid Other | Source: Ambulatory Visit | Attending: Family Medicine | Admitting: Family Medicine

## 2010-07-25 ENCOUNTER — Ambulatory Visit (INDEPENDENT_AMBULATORY_CARE_PROVIDER_SITE_OTHER): Payer: Medicaid Other | Admitting: Family Medicine

## 2010-07-25 ENCOUNTER — Encounter: Payer: Self-pay | Admitting: Family Medicine

## 2010-07-25 DIAGNOSIS — R079 Chest pain, unspecified: Secondary | ICD-10-CM | POA: Insufficient documentation

## 2010-07-25 DIAGNOSIS — R9431 Abnormal electrocardiogram [ECG] [EKG]: Secondary | ICD-10-CM | POA: Insufficient documentation

## 2010-07-25 MED ORDER — OMEPRAZOLE 20 MG PO CPDR: 20.0000 mg | DELAYED_RELEASE_CAPSULE | Freq: Every day | ORAL | Status: DC

## 2010-07-25 NOTE — Patient Instructions (Signed)
Thank you for coming in today. Come back in 2 weeks.  If you chest pain continues or worsens or does not go away after a short period of time go to the ER or call.  I will try to get you in with the heart doctors.  Let me know how you mood is doing over the next two weeks.  Keep a log on your anxiety and these episodes.  When do they happen and what triggers them?

## 2010-07-25 NOTE — Assessment & Plan Note (Signed)
Thank you this is atypical chest pain.  Patient does have some risk factors including somewhat long-standing hypertension.  EKG is also abnormal with first degree block and T-wave depression in the lateral precordial leads which is changed from prior studies. Additionally she may have a partial right bundle branch block. Plan: I feel Katelyn [name] would benefit from further workup.  Will refer to cardiology in the hopes of a potential stress test and possibly an echo to evaluate her what is likely to be a non-pathologic murmur.  Discuss red flag signs or symptoms with the patient who expresses understanding.  Additionally this may be simple panic attack however would like to evaluate for cardiac etiologies first.  I will follow patient up in 2 weeks following cardiac evaluation.  If cardiac etiology is less likely will pursue panic with SSRI and potential benzodiazepines.

## 2010-07-25 NOTE — Progress Notes (Signed)
Ms Katelyn [name] notes substernal chest pain described a sharp occuring over the past 2 weeks. The pain does not radiate and is not associated with dyspnea. She does note that he heart is racing during these episodes.  It occurs off and on lasting a brief period of time. It is not associated with exertion. Ms Katelyn [name] denies any association with food.  Her pain often occurs ar rest. She notes anxiety with the pain. She has had panic attacks in the past and does not think this is typical for her. She is not currently experiencing any panic.   PMH reviewed.  ROS as above otherwise neg   Exam:  Vs noted.  Gen: Well NAD HEENT: EOMI, , MMM Lungs: CTABL Nl WOB Heart: RRR no soft 2/6 systolic murmur at RUSB not radiating .  Abd: NABS, NT, ND Exts: Non edematous BL  LE. Right calf 34 cm left calf 34 cm.  Psych: Normal affect and eye contact. No SI/HI. No delusions or hallucinations expressed.

## 2010-07-31 ENCOUNTER — Encounter: Payer: Self-pay | Admitting: Cardiovascular Disease

## 2010-08-01 ENCOUNTER — Encounter: Payer: Self-pay | Admitting: Cardiovascular Disease

## 2010-08-01 ENCOUNTER — Ambulatory Visit (INDEPENDENT_AMBULATORY_CARE_PROVIDER_SITE_OTHER): Payer: Medicaid Other | Admitting: Cardiovascular Disease

## 2010-08-01 DIAGNOSIS — R079 Chest pain, unspecified: Secondary | ICD-10-CM

## 2010-08-01 DIAGNOSIS — I10 Essential (primary) hypertension: Secondary | ICD-10-CM

## 2010-08-01 NOTE — Progress Notes (Signed)
35 yo with 2.5 weeks of atypical SSCP.  Not really exertional.  Worse at night  Acid medicine seems to have helped.  CRF HTN on good Rx for a while and smoking.  Counseled for less than 10 minutes on cessation.  Restarted .5-1 ppd 4 years ago.  Has not had recent CXR.  Quit cold [place] before.  Pain is improving, nonexertional sharp and can be intermitant most of the day especially after work.  Has history of panic attacks but not like this  ROS: Denies fever, malais, weight loss, blurry vision, decreased visual acuity, cough, sputum, SOB, hemoptysis, pleuritic pain, palpitaitons, heartburn, abdominal pain, melena, lower extremity edema, claudication, or rash.  All other systems reviewed and negative  General: Affect appropriate Healthy:  appears stated age HEENT: normal Neck supple with no adenopathy JVP normal no bruits no thyromegaly Lungs clear with no wheezing and good diaphragmatic motion Heart:  S1/S2 no murmur,rub, gallop or click PMI normal Abdomen: benighn, BS positve, no tenderness, no AAA no bruit.  No HSM or HJR Distal pulses intact with no bruits No edema Neuro non-focal Skin warm and dry No muscular weakness   Current Outpatient Prescriptions  Medication Sig Dispense Refill  . HYDROcodone-acetaminophen (VICODIN) 5-500 MG per tablet Take 1 tablet by mouth every 6 (six) hours as needed.        Marland Kitchen lisinopril-hydrochlorothiazide (PRINZIDE,ZESTORETIC) 20-25 MG per tablet Take 1 tablet by mouth daily.  90 tablet  3  . omeprazole (PRILOSEC) 20 MG capsule Take 1 capsule (20 mg total) by mouth daily.  30 capsule  2    Allergies  Review of patient's allergies indicates no known allergies.  Electrocardiogram:  07/25/10  NSR 71  normal  Assessment and Plan

## 2010-08-01 NOTE — Assessment & Plan Note (Signed)
F/U primary.  Seems well controlled and situational

## 2010-08-01 NOTE — Assessment & Plan Note (Signed)
Well controlled.  Continue current medications and low sodium Dash type diet.    

## 2010-08-01 NOTE — Assessment & Plan Note (Signed)
Atypical Normal ECG  CRF;s HTN and smoking  F/U ETT

## 2010-08-01 NOTE — Patient Instructions (Addendum)
Your physician recommends that you schedule a follow-up appointment in: AS NEEDED  Your physician discussed the importance of regular exercise and recommended that you start or continue a regular exercise program for good health. WITH [employer] PT'S CONVENIENCE DX CHEST PAIN A chest x-ray takes a picture of the organs and structures inside the chest, including the heart, lungs, and blood vessels. This test can show several things, including, whether the heart is enlarges; whether fluid is building up in the lungs; and whether pacemaker / defibrillator leads are still in place.  DX CHEST PAIN

## 2010-08-01 NOTE — Assessment & Plan Note (Signed)
Counseled  She started back 4 years ago during divorce.  Will consider quitting cold [place] again.  CXR needs to be done in setting of atypical pain and ongoing smoking

## 2010-08-15 ENCOUNTER — Emergency Department (HOSPITAL_COMMUNITY)
Admission: EM | Admit: 2010-08-15 | Discharge: 2010-08-15 | Disposition: A | Discharge status: Home / Self Care | Payer: Medicaid Other | Attending: Emergency Medicine | Admitting: Emergency Medicine

## 2010-08-15 DIAGNOSIS — E876 Hypokalemia: Secondary | ICD-10-CM | POA: Insufficient documentation

## 2010-08-15 DIAGNOSIS — Z79899 Other long term (current) drug therapy: Secondary | ICD-10-CM | POA: Insufficient documentation

## 2010-08-15 DIAGNOSIS — I10 Essential (primary) hypertension: Secondary | ICD-10-CM | POA: Insufficient documentation

## 2010-08-15 DIAGNOSIS — IMO0001 Reserved for inherently not codable concepts without codable children: Secondary | ICD-10-CM | POA: Insufficient documentation

## 2010-08-15 LAB — URINALYSIS, ROUTINE W REFLEX MICROSCOPIC
Leukocytes, UA: NEGATIVE
Nitrite: NEGATIVE
Specific Gravity, Urine: 1.015 (ref 1.005–1.030)
pH: 7 (ref 5.0–8.0)

## 2010-08-15 LAB — POCT I-STAT, CHEM 8
Calcium, Ion: 1.18 mmol/L (ref 1.12–1.32)
Chloride: 103 mEq/L (ref 96–112)
HCT: 45 % (ref 36.0–46.0)
Hemoglobin: 15.3 g/dL — ABNORMAL HIGH (ref 12.0–15.0)
Potassium: 3.2 mEq/L — ABNORMAL LOW (ref 3.5–5.1)

## 2010-08-20 ENCOUNTER — Encounter: Payer: Self-pay | Admitting: Family Medicine

## 2010-08-20 ENCOUNTER — Ambulatory Visit (INDEPENDENT_AMBULATORY_CARE_PROVIDER_SITE_OTHER): Payer: Medicaid Other | Admitting: Family Medicine

## 2010-08-20 VITALS — BP 126/86 | HR 69 | Temp 98.1°F | Ht 68.0 in | Wt 175.0 lb

## 2010-08-20 DIAGNOSIS — R252 Cramp and spasm: Secondary | ICD-10-CM

## 2010-08-20 LAB — COMPREHENSIVE METABOLIC PANEL
ALT: 21 U/L (ref 0–35)
AST: 23 U/L (ref 0–37)
CO2: 26 mEq/L (ref 19–32)
Chloride: 97 mEq/L (ref 96–112)
Sodium: 132 mEq/L — ABNORMAL LOW (ref 135–145)
Total Bilirubin: 0.4 mg/dL (ref 0.3–1.2)
Total Protein: 7.2 g/dL (ref 6.0–8.3)

## 2010-08-20 LAB — TSH: TSH: 0.879 u[IU]/mL (ref 0.350–4.500)

## 2010-08-20 NOTE — Patient Instructions (Signed)
Stretches as shown today, daily exercise, ice, heat, tonic water may help Make follow-up with Dr. Denyse Amass to discuss anxiety.  Muscle Cramps Muscle cramps are due to sudden involuntary muscle contraction. This means you have no control over the tightening of a muscle (or muscles). Often there are no obvious causes. Muscle cramps may occur with over-exertion. They may also occur with chilling of the muscles. An example of a muscle chilling activity is swimming. It is uncommon for cramps to be due to a serious underlying disorder. In most cases, muscle cramps improve (or leave) within minutes. CAUSES Some common causes are due to:  Injury.   Infections, especially viral.   Abnormal levels of the salts and ions in your blood (electrolytes). This could happen if you are taking water pills (diuretics).   Blood vessel disease where not enough blood is getting to the muscles (intermittent claudication).  SOME UNCOMMON CAUSES OF MUSCLE CRAMPS ARE:  Side effects of some medicine (such as lithium).   Alcohol abuse.   Diseases where there is soreness (inflammation) of the muscular system.  HOME CARE INSTRUCTIONS  It may be helpful to massage, stretch, and relax the affected muscle.   Taking a dose of over-the-counter Benadryl (dephenhydramine) is helpful for night leg cramps.   Tonic water that contains quinine may be helpful.  SEEK MEDICAL CARE IF:  Cramps are frequent and not relieved with medicine.  MAKE SURE YOU:    Understand these instructions.   Will watch your condition.   Will get help right away if you are not doing well or get worse.  Document Released: 06/14/2001 Document Re-Released: 03/21/2008 [facility] Patient Information 2011 [place], [place].

## 2010-08-20 NOTE — Assessment & Plan Note (Signed)
History and physical today not concerning for CNS etiology, will recheck labs to follow-up on hypokalemia.  Will also check TSH, CK, vit D  Gave patient tips for symptomatic tx- daily exercise, stretching, may use tonic water prn.    Advised to follow-up with PCP on anxiety which may contribute to her symptoms.

## 2010-08-20 NOTE — Progress Notes (Signed)
  Subjective:    Patient ID: Katelyn [name], female    DOB: 06/24/1975, 35 y.o.   MRN: [id number]  HPI 6 days of leg cramps.  Never had before. Day or night, not associate with walking.  Describes are cramping over calves and anterior legs, travels up to thighs and both arms.  Also feels cramping in both sides of face.  No new physical activity.  Works doing Product manager.  Denies new psychological stress.    Review of Systems Gen:  No fever, chills, unexplained weight loss Ears:  No hearing loss, ringing Eyes: No vision changes, double vision, eye drainage Nose:  No rhinorrhea, congestion Throat:  No sore throat or dysphagia CV: + chest pain, + palpitations (going to ETT this week) ,  No dyspnea on exertion, or edema Resp: No cough, dyspnea, wheezing Abd: No nausea, vomting, diarrhea, constipation, or change in bowel color, size, or caliber. MSK: no joint pain SKIN: no rash, changing moles GU: No dysuria, vaginal discharge Neuro:  No headache, numbness, tingling, weakness, syncope      Objective:   Physical Exam GEN: Alert & Oriented, No acute distress CV:  Regular Rate & Rhythm, no murmur Respiratory:  Normal work of breathing, CTAB Abd:  + BS, soft, no tenderness to palpation Ext: no pre-tibial edema Neuro:  CN 2-2 intact.  Strength 5/5 bilaterally.  Normal sensation to light touch.  Patellar and biceps relfexes equal bilaterally.      Assessment & Plan:

## 2010-08-21 ENCOUNTER — Telehealth: Payer: Self-pay | Admitting: Family Medicine

## 2010-08-21 MED ORDER — POTASSIUM CHLORIDE CRYS ER 20 MEQ PO TBCR: 20.0000 meq | EXTENDED_RELEASE_TABLET | Freq: Every day | ORAL | Status: DC

## 2010-08-21 MED ORDER — ERGOCALCIFEROL 1.25 MG (50000 UT) PO CAPS: 50000.0000 [IU] | ORAL_CAPSULE | ORAL | Status: DC

## 2010-08-21 NOTE — Telephone Encounter (Signed)
Discussed low K and vitamin D.  Will replete,  Patient has follow-up with pcp in 2 weeks

## 2010-08-21 NOTE — Telephone Encounter (Signed)
Pt returned call, pls call her at [phone]

## 2010-08-26 ENCOUNTER — Encounter: Payer: Medicaid Other | Admitting: Cardiovascular Disease

## 2010-08-30 ENCOUNTER — Ambulatory Visit: Payer: Medicaid Other | Admitting: Family Medicine

## 2010-09-11 ENCOUNTER — Encounter: Payer: Medicaid Other | Admitting: Cardiovascular Disease

## 2010-09-12 ENCOUNTER — Encounter: Payer: Self-pay | Admitting: Family Medicine

## 2010-09-12 ENCOUNTER — Ambulatory Visit (INDEPENDENT_AMBULATORY_CARE_PROVIDER_SITE_OTHER): Payer: Medicaid Other | Admitting: Family Medicine

## 2010-09-12 DIAGNOSIS — R350 Frequency of micturition: Secondary | ICD-10-CM

## 2010-09-12 DIAGNOSIS — F411 Generalized anxiety disorder: Secondary | ICD-10-CM

## 2010-09-12 LAB — POCT URINALYSIS DIPSTICK
Ketones, UA: NEGATIVE
Protein, UA: NEGATIVE
Spec Grav, UA: <=1.005
Urobilinogen, UA: 0.2
pH, UA: 6.5

## 2010-09-12 MED ORDER — CLONAZEPAM 0.5 MG PO TABS: 0.5000 mg | ORAL_TABLET | Freq: Every day | ORAL | Status: AC

## 2010-09-12 MED ORDER — CITALOPRAM HYDROBROMIDE 10 MG PO TABS: 10.0000 mg | ORAL_TABLET | Freq: Every day | ORAL | Status: DC

## 2010-09-12 NOTE — Patient Instructions (Signed)
Thank you for coming in today. Clonazepam at night, and the citalopram during the day.   Come back to see me in 4 weeks or sooner.  At me know if you feel worse.  If you feel like hurting your self or others please let me know or call 911. You should feel much better with both of these medicines.

## 2010-09-12 NOTE — Assessment & Plan Note (Addendum)
Her diagnosis is most likely panic based on her symptom.   As she didn't tolerate Zoloft while in the past will treat with Celexa.  Will start with 10 mg daily.  Will likely increase the dose and add buspirone at a followup visit.   Additionally he will use Klonopin at night for the first month.  Discussed that this medication we'll not continue after this month.  Warned about driving and operating machinery while on this medication. Discussed some strategies for dealing with panic attacks. Discussed red flag signs or symptoms including suicidal or homicidal feelings.   Patient will follow up with me in one month or sooner as needed.

## 2010-09-12 NOTE — Progress Notes (Signed)
[name] is here to followup her anxiety.  She has been dealing with anxiety for years. However which was her most is her panic attacks which occur 2-3 times a week, and typically last 10-30 minutes.  These are very distressing for her she describes tachycardia and feeling like she might die.  She additionally notes that she is fearful of going out and she is afraid of having a panic attack.  She has been treated for anxiety the past however she can't remember what she was treated with.  She also has been treated for depression in 2008 with Zoloft which she didn't tolerate well.  She denies any other mental health diagnoses.   A young mania rating scale: Was 4 today.  She denies any suicidal or homicidal ideation.  Also is here for dysuria present for less than one week.  No burning or tingling, describes urinary frequency.  No vaginal discharge.  PMH reviewed.  ROS as above otherwise neg  Exam:  BP 122/77  Pulse 77  Temp(Src) 98.5 F (36.9 C) (Oral)  Ht 5\' 9"  (1.753 m)  Wt 177 lb (80.287 kg)  BMI 26.14 kg/m2 Gen: Well NAD HEENT: EOMI,  MMM Exts: Non edematous BL  LE, warm and well perfused.  Psych: Mood and attention and affect are normal. Judgment is intact.  No psychomotor agitation or depression. Speech is normal and thought process is linear and goal-directed.  No SI or HI.   Urinalysis is normal

## 2010-09-12 NOTE — Assessment & Plan Note (Signed)
No recent episodes as a stress test scheduled soon

## 2010-09-12 NOTE — Assessment & Plan Note (Signed)
With normal urinalysis.  Not a urinary tract infection.  If still persistent in one month we will proceed with vaginal exam wet prep GC and Chlamydia testing.

## 2010-09-24 ENCOUNTER — Encounter: Payer: Self-pay | Admitting: Cardiovascular Disease

## 2011-01-17 ENCOUNTER — Ambulatory Visit: Payer: Medicaid Other | Admitting: Family Medicine

## 2011-01-22 ENCOUNTER — Ambulatory Visit: Payer: Medicaid Other | Admitting: Family Medicine

## 2011-03-06 ENCOUNTER — Ambulatory Visit: Payer: Medicaid Other | Admitting: Family Medicine

## 2011-03-06 ENCOUNTER — Ambulatory Visit (INDEPENDENT_AMBULATORY_CARE_PROVIDER_SITE_OTHER): Payer: Managed Care, Other (non HMO) | Admitting: Family Medicine

## 2011-03-06 ENCOUNTER — Encounter: Payer: Self-pay | Admitting: Family Medicine

## 2011-03-06 VITALS — BP 118/82 | HR 88 | Ht 67.0 in | Wt 168.0 lb

## 2011-03-06 DIAGNOSIS — J069 Acute upper respiratory infection, unspecified: Secondary | ICD-10-CM | POA: Insufficient documentation

## 2011-03-06 NOTE — Patient Instructions (Signed)
Use iburpofen- up to 800 mg three times a day  Upper Respiratory Infection, Adult An upper respiratory infection (URI) is also sometimes known as the common cold. The upper respiratory tract includes the nose, sinuses, throat, trachea, and bronchi. Bronchi are the airways leading to the lungs. Most people improve within 1 week, but symptoms can last up to 2 weeks. A residual cough may last even longer.   CAUSES Many different viruses can infect the tissues lining the upper respiratory tract. The tissues become irritated and inflamed and often become very moist. Mucus production is also common. A cold is contagious. You can easily spread the virus to others by oral contact. This includes kissing, sharing a glass, coughing, or sneezing. Touching your mouth or nose and then touching a surface, which is then touched by another person, can also spread the virus. SYMPTOMS   Symptoms typically develop 1 to 3 days after you come in contact with a cold virus. Symptoms vary from person to person. They may include:  Runny nose.     Sneezing.    Nasal congestion.     Sinus irritation.     Sore throat.     Loss of voice (laryngitis).     Cough.    Fatigue.    Muscle aches.     Loss of appetite.     Headache.    Low-grade fever.  DIAGNOSIS   You might diagnose your own cold based on familiar symptoms, since most people get a cold 2 to 3 times a year. Your caregiver can confirm this based on your exam. Most importantly, your caregiver can check that your symptoms are not due to another disease such as strep throat, sinusitis, pneumonia, asthma, or epiglottitis. Blood tests, throat tests, and X-rays are not necessary to diagnose a common cold, but they may sometimes be helpful in excluding other more serious diseases. Your caregiver will decide if any further tests are required. RISKS AND COMPLICATIONS   You may be at risk for a more severe case of the common cold if you smoke cigarettes, have  chronic heart disease (such as heart failure) or lung disease (such as asthma), or if you have a weakened immune system. The very young and very old are also at risk for more serious infections. Bacterial sinusitis, middle ear infections, and bacterial pneumonia can complicate the common cold. The common cold can worsen asthma and chronic obstructive pulmonary disease (COPD). Sometimes, these complications can require emergency medical care and may be life-threatening. PREVENTION   The best way to protect against getting a cold is to practice good hygiene. Avoid oral or hand contact with people with cold symptoms. Wash your hands often if contact occurs. There is no clear evidence that vitamin C, vitamin E, echinacea, or exercise reduces the chance of developing a cold. However, it is always recommended to get plenty of rest and practice good nutrition. TREATMENT   Treatment is directed at relieving symptoms. There is no cure. Antibiotics are not effective, because the infection is caused by a virus, not by bacteria. Treatment may include:  Increased fluid intake. Sports drinks offer valuable electrolytes, sugars, and fluids.     Breathing heated mist or steam (vaporizer or shower).     Eating chicken soup or other clear broths, and maintaining good nutrition.     Getting plenty of rest.     Using gargles or lozenges for comfort.     Controlling fevers with ibuprofen or acetaminophen as directed by your  caregiver.     Increasing usage of your inhaler if you have asthma.  Zinc gel and zinc lozenges, taken in the first 24 hours of the common cold, can shorten the duration and lessen the severity of symptoms. Pain medicines may help with fever, muscle aches, and throat pain. A variety of non-prescription medicines are available to treat congestion and runny nose. Your caregiver can make recommendations and may suggest nasal or lung inhalers for other symptoms.   HOME CARE INSTRUCTIONS    Only take  over-the-counter or prescription medicines for pain, discomfort, or fever as directed by your caregiver.     Use a warm mist humidifier or inhale steam from a shower to increase air moisture. This may keep secretions moist and make it easier to breathe.     Drink enough water and fluids to keep your urine clear or pale yellow.     Rest as needed.     Return to work when your temperature has returned to normal or as your caregiver advises. You may need to stay home longer to avoid infecting others. You can also use a face mask and careful hand washing to prevent spread of the virus.  SEEK MEDICAL CARE IF:    After the first few days, you feel you are getting worse rather than better.     You need your caregiver's advice about medicines to control symptoms.     You develop chills, worsening shortness of breath, or brown or red sputum. These may be signs of pneumonia.     You develop yellow or brown nasal discharge or pain in the face, especially when you bend forward. These may be signs of sinusitis.     You develop a fever, swollen neck glands, pain with swallowing, or white areas in the back of your throat. These may be signs of strep throat.  SEEK IMMEDIATE MEDICAL CARE IF:    You have a fever.     You develop severe or persistent headache, ear pain, sinus pain, or chest pain.     You develop wheezing, a prolonged cough, cough up blood, or have a change in your usual mucus (if you have chronic lung disease).     You develop sore muscles or a stiff neck.  Document Released: 06/18/2000 Document Revised: 09/04/2010 Document Reviewed: 04/26/2010 [facility] Patient Information 2012 [place], [place].

## 2011-03-06 NOTE — Progress Notes (Signed)
  Subjective:    Patient ID: Katelyn [name], female    DOB: Dec 03, 1975, 36 y.o.   MRN: [id number]  HPI Work in appt for uri  Woke up today, felt "achy all over" with cough, nausea, rhinorrhea.  No fever, dyspnea.  Notes some lightheadedness.  Has not tried any OTC medications.  Worried she might be catching a cold- her son and mother are recovering from [place].  PMH sig for smoking Review of Systems See HPI    Objective:   Physical Exam  GEN: Alert & Oriented, No acute distress HEENT: [place]/AT. EOMI, PERRLA, no conjunctival injection or scleral icterus.  Bilateral tympanic membranes intact without erythema or effusion.  .  Nares without edema or rhinorrhea.  Oropharynx is without erythema or exudates.  No anterior or posterior cervical lymphadenopathy. CV:  Regular Rate & Rhythm, no murmur Respiratory:  Normal work of breathing, CTAB        Assessment & Plan:

## 2011-03-06 NOTE — Assessment & Plan Note (Signed)
Viral illness, only several hours in duration. Discussed ibuprofen for muscle aches and supportive care.  Discussed red fags for return.

## 2011-03-20 ENCOUNTER — Other Ambulatory Visit (HOSPITAL_COMMUNITY)
Admission: RE | Admit: 2011-03-20 | Discharge: 2011-03-20 | Disposition: A | Discharge status: Home / Self Care | Payer: Managed Care, Other (non HMO) | Source: Ambulatory Visit | Attending: Family Medicine | Admitting: Family Medicine

## 2011-03-20 ENCOUNTER — Ambulatory Visit (INDEPENDENT_AMBULATORY_CARE_PROVIDER_SITE_OTHER): Payer: Managed Care, Other (non HMO) | Admitting: Family Medicine

## 2011-03-20 ENCOUNTER — Encounter: Payer: Self-pay | Admitting: Family Medicine

## 2011-03-20 VITALS — BP 118/84 | HR 84 | Ht 67.0 in | Wt 167.0 lb

## 2011-03-20 DIAGNOSIS — Z01419 Encounter for gynecological examination (general) (routine) without abnormal findings: Secondary | ICD-10-CM

## 2011-03-20 DIAGNOSIS — Z202 Contact with and (suspected) exposure to infections with a predominantly sexual mode of transmission: Secondary | ICD-10-CM

## 2011-03-20 DIAGNOSIS — F411 Generalized anxiety disorder: Secondary | ICD-10-CM

## 2011-03-20 DIAGNOSIS — F17200 Nicotine dependence, unspecified, uncomplicated: Secondary | ICD-10-CM

## 2011-03-20 DIAGNOSIS — Z113 Encounter for screening for infections with a predominantly sexual mode of transmission: Secondary | ICD-10-CM | POA: Insufficient documentation

## 2011-03-20 DIAGNOSIS — I10 Essential (primary) hypertension: Secondary | ICD-10-CM

## 2011-03-20 DIAGNOSIS — Z124 Encounter for screening for malignant neoplasm of cervix: Secondary | ICD-10-CM

## 2011-03-20 LAB — COMPLETE METABOLIC PANEL WITH GFR
Albumin: 4.9 g/dL (ref 3.5–5.2)
CO2: 26 mEq/L (ref 19–32)
GFR, Est African American: >89 mL/min
GFR, Est Non African American: >89 mL/min
Glucose, Bld: 87 mg/dL (ref 70–99)
Potassium: 3.5 mEq/L (ref 3.5–5.3)
Sodium: 136 mEq/L (ref 135–145)
Total Protein: 7.4 g/dL (ref 6.0–8.3)

## 2011-03-20 MED ORDER — SERTRALINE HCL 25 MG PO TABS: 50.0000 mg | ORAL_TABLET | Freq: Every day | ORAL | Status: DC

## 2011-03-20 MED ORDER — LISINOPRIL-HYDROCHLOROTHIAZIDE 20-25 MG PO TABS: 1.0000 | ORAL_TABLET | Freq: Every day | ORAL | Status: DC

## 2011-03-20 NOTE — Patient Instructions (Addendum)
Thank you for coming in today. I will call you with the results of your test.  Please start taking the sertiline (zoloft) daily for 1 week. After 1 week take 2 pills daily.  See me in 2 weeks.  If you think about hurting yourself or others please let me know.   Anxiety and Panic Attacks Your caregiver has informed you that you are having an anxiety or panic attack. There may be many forms of this. Most of the time these attacks come suddenly and without warning. They come at any time of day, including periods of sleep, and at any time of life. They may be strong and unexplained. Although panic attacks are very scary, they are physically harmless. Sometimes the cause of your anxiety is not known. Anxiety is a protective mechanism of the body in its fight or flight mechanism. Most of these perceived danger situations are actually nonphysical situations (such as anxiety over losing a job). CAUSES   The causes of an anxiety or panic attack are many. Panic attacks may occur in otherwise healthy people given a certain set of circumstances. There may be a genetic cause for panic attacks. Some medications may also have anxiety as a side effect. SYMPTOMS   Some of the most common feelings are:  Intense terror.   Dizziness, feeling faint.   Hot and cold flashes.   Fear of going crazy.   Feelings that nothing is real.   Sweating.   Shaking.   Chest pain or a fast heartbeat (palpitations).   Smothering, choking sensations.   Feelings of impending doom and that death is near.   Tingling of extremities, this may be from over-breathing.   Altered reality (derealization).   Being detached from yourself (depersonalization).  Several symptoms can be present to make up anxiety or panic attacks. DIAGNOSIS   The evaluation by your caregiver will depend on the type of symptoms you are experiencing. The diagnosis of anxiety or panic attack is made when no physical illness can be determined to be a  cause of the symptoms. TREATMENT   Treatment to prevent anxiety and panic attacks may include:  Avoidance of circumstances that cause anxiety.   Reassurance and relaxation.   Regular exercise.   Relaxation therapies, such as yoga.   Psychotherapy with a psychiatrist or therapist.   Avoidance of caffeine, alcohol and illegal drugs.   Prescribed medication.  SEEK IMMEDIATE MEDICAL CARE IF:    You experience panic attack symptoms that are different than your usual symptoms.   You have any worsening or concerning symptoms.  Document Released: 12/23/2004 Document Revised: 12/12/2010 Document Reviewed: 04/26/2009 [facility] Patient Information 2012 [place], [place].

## 2011-03-21 ENCOUNTER — Encounter: Payer: Self-pay | Admitting: Family Medicine

## 2011-03-24 ENCOUNTER — Encounter: Payer: Self-pay | Admitting: Family Medicine

## 2011-03-24 DIAGNOSIS — Z01419 Encounter for gynecological examination (general) (routine) without abnormal findings: Secondary | ICD-10-CM | POA: Insufficient documentation

## 2011-03-24 NOTE — Assessment & Plan Note (Signed)
Encouraged patient to quit. She is still thinking about it.

## 2011-03-24 NOTE — Assessment & Plan Note (Signed)
Doing well today. Last pap 2010. Vaginal spotting normal a few years after ablation.  Will obtain Pap smear, RPR, HIV and followup with a repeat Pap in 3 years.  Health maintenance, past medical, past surgical, family history, social history updated in the electronic medical record

## 2011-03-24 NOTE — Assessment & Plan Note (Signed)
Generalized anxiety disorder with possible panic based on GAD 7 of 21. I feel the bipolar disorder is less likely given negative family history.  However we will continue to keep this in the back of her thoughts as she does have a equivocal mood disorder questionnaire.  Plan to start with low-dose Zoloft with titration and followup in 2 weeks.  Discussed warning signs including suicidality or homicidality. Patient expresses understanding.

## 2011-03-24 NOTE — Assessment & Plan Note (Signed)
We'll assess for HIV, RPR, GC, , Chlamydia

## 2011-03-24 NOTE — Progress Notes (Signed)
Katelyn [name] is a 36 y.o. female who presents to [facility] today for   1) well woman exam with Pap smear. Doing well in the last year. She uses bilateral tubal ligation and endometrial ablation for fibroids for birth control.  She notes occasional spotting new this year.  She denies any history of bleeding over the last few years after her endometrial ablation.  She feels well without any pain fevers chills or vaginal discharge. She would like HIV and syphilis testing today, as she has had more than one sexual partner this year.   2) anxiety: Worsening recently. Notes daily anxiety and worrying.  She notes occasional panic as well. Sometimes she is afraid to leave the house. In the past she's been treated with Celexa for depression but is not taking it currently.  She notes a positive family history for depression and mom cousins but denies any family history of bipolar disorder.  PHQ9: 15 GAD 7: 21 Mood disorder questionnaire: 8  3) hypertension: Taking lisinopril hydrochlorothiazide. Doing well without symptoms.   PMH reviewed. Significant for hypertension Past surgical history: Bilateral tubal ligation and endometrial ablation. Family history: Positive for depression Social history: Positive for tobacco smoking negative for marijuana or other drugs. Occasional alcohol use. ROS as above otherwise neg Medications reviewed. Current Outpatient Prescriptions  Medication Sig Dispense Refill  . lisinopril-hydrochlorothiazide (PRINZIDE,ZESTORETIC) 20-25 MG per tablet Take 1 tablet by mouth daily.  90 tablet  3  . sertraline (ZOLOFT) 25 MG tablet Take 2 tablets (50 mg total) by mouth daily.  60 tablet  1    Exam:  BP 118/84  Pulse 84  Ht 5\' 7"  (1.702 m)  Wt 167 lb (75.751 kg)  BMI 26.16 kg/m2 Gen: Well NAD HEENT: EOMI,  MMM Lungs: CTABL Nl WOB Heart: RRR no MRG Abd: NABS, NT, ND Exts: Non edematous BL  LE, warm and well perfused.  GYN: Normal external genitalia. Normal vaginal canal.  Normal cervix. Bimanual exam is normal with normal uterus size and position and no adnexal fullness or tenderness.  Psych: Alert and oriented. Affect and eye contact are normal. Speech and thought process are normal. No hallucinations delusions suicidal ideation or homicidal ideation expressed.

## 2011-04-11 ENCOUNTER — Ambulatory Visit: Payer: Managed Care, Other (non HMO) | Admitting: Family Medicine

## 2011-06-23 ENCOUNTER — Emergency Department (INDEPENDENT_AMBULATORY_CARE_PROVIDER_SITE_OTHER)
Admission: EM | Admit: 2011-06-23 | Discharge: 2011-06-23 | Disposition: A | Discharge status: Home / Self Care | Payer: Managed Care, Other (non HMO) | Source: Home / Self Care | Attending: Family Medicine | Admitting: Family Medicine

## 2011-06-23 ENCOUNTER — Encounter (HOSPITAL_COMMUNITY): Payer: Self-pay | Admitting: *Deleted

## 2011-06-23 DIAGNOSIS — N390 Urinary tract infection, site not specified: Secondary | ICD-10-CM

## 2011-06-23 HISTORY — DX: Major depressive disorder, single episode, unspecified: F32.9

## 2011-06-23 HISTORY — DX: Depression, unspecified: F32.A

## 2011-06-23 LAB — POCT URINALYSIS DIP (DEVICE)
Protein, ur: 30 mg/dL — AB
Urobilinogen, UA: 0.2 mg/dL (ref 0.0–1.0)

## 2011-06-23 MED ORDER — CIPROFLOXACIN HCL 500 MG PO TABS: 500.0000 mg | ORAL_TABLET | Freq: Two times a day (BID) | ORAL | Status: DC

## 2011-06-23 NOTE — Discharge Instructions (Signed)

## 2011-06-23 NOTE — ED Notes (Addendum)
C/O suprapubic discomfort, dysuria, frequent urination since this afternoon; has had hematuria as well.  Denies fever, flank or back pain.  Denies any unusual vaginal discharge.

## 2011-06-25 ENCOUNTER — Telehealth (HOSPITAL_COMMUNITY): Payer: Self-pay | Admitting: *Deleted

## 2011-06-25 LAB — URINE CULTURE

## 2011-06-25 MED ORDER — PHENAZOPYRIDINE HCL 200 MG PO TABS: 200.0000 mg | ORAL_TABLET | Freq: Three times a day (TID) | ORAL | Status: AC

## 2011-06-25 MED ORDER — SULFAMETHOXAZOLE-TRIMETHOPRIM 800-160 MG PO TABS: 1.0000 | ORAL_TABLET | Freq: Two times a day (BID) | ORAL | Status: AC

## 2011-06-25 NOTE — ED Notes (Signed)
Urine culture: >100,000 colonies E. Coli.  Pt. treated with Cipro-resistant.  Lab shown to Dr. Tressia Danas and she changed orders to Septra DS and Pyridium.  Rx.'s e-prescribed to [facility] on [address]  I called pt. and left a message to call. Katelyn [name] 06/25/2011

## 2011-06-25 NOTE — ED Notes (Signed)
Pt. called back and verified x 2. Pt. given results and told to stop the Cipro and start the Septra DS and Pyridium Pt. told that the Pyridium would turn her urine a bright orange due to a dye in it.  It would help with her symptoms of burning, urgency and frequency. Pt. told to finish all of the Septra DS. Pt. told that it was sent to [facility] on [address] at [employer]. Pt. said she knows because she got a text from them.  Pt. voiced understanding. Vassie Moselle 06/25/2011

## 2011-06-25 NOTE — ED Provider Notes (Signed)
History     CSN: [id number]  Arrival date & time 06/23/11  1641   First MD Initiated Contact with Patient 06/23/11 1708      Chief Complaint  Patient presents with  . Urinary Tract Infection    (Consider location/radiation/quality/duration/timing/severity/associated sxs/prior treatment) HPI Comments: 36 year old smoker female with history of hypertension. Here complaining of urinary frequency, suprapubic pressure, hematuria and burning on urination that started today. Denies fever or chills. No headache. No nausea vomiting or diarrhea. No flank or back pain. No vaginal discharge. Patient does not have menstrual periods as she is status post endometrial ablation. Patient reports she also had bilateral tubal ligation prior her endometrial ablation procedure.    Past Medical History  Diagnosis Date  . Hypertension   . Palpitations   . Chest pain   . Anemia   . Anxiety   . Depression     Past Surgical History  Procedure Date  . Hysteroscopy w/ endometrial ablation   . Tubal ligation     Family History  Problem Relation Age of Onset  . Diabetes Mother   . Depression Mother   . Depression Sister     History  Substance Use Topics  . Smoking status: Current Everyday Smoker -- 0.5 packs/day    Types: Cigarettes  . Smokeless tobacco: Never Used  . Alcohol Use: 2.0 oz/week    4 drink(s) per week     occasional    OB History    Grav Para Term Preterm Abortions TAB SAB Ect Mult Living                  Review of Systems  Constitutional: Negative for fever, chills and appetite change.  Gastrointestinal: Negative for nausea, vomiting and diarrhea.  Genitourinary: Positive for dysuria, frequency and hematuria. Negative for flank pain, vaginal bleeding, vaginal discharge and genital sores.  Musculoskeletal: Negative for back pain.  Neurological: Negative for dizziness and headaches.    Allergies  Review of patient's allergies indicates no known allergies.  Home  Medications   Current Outpatient Rx  Name Route Sig Dispense Refill  . LISINOPRIL-HYDROCHLOROTHIAZIDE 20-25 MG PO TABS Oral Take 1 tablet by mouth daily.    . SERTRALINE HCL 25 MG PO TABS Oral Take 2 tablets (50 mg total) by mouth daily. 60 tablet 1  . CIPROFLOXACIN HCL 500 MG PO TABS Oral Take 1 tablet (500 mg total) by mouth every 12 (twelve) hours. 10 tablet 0  . LISINOPRIL-HYDROCHLOROTHIAZIDE 20-25 MG PO TABS Oral Take 1 tablet by mouth daily. 90 tablet 3    BP 142/90  Pulse 65  Temp 98.4 F (36.9 C) (Oral)  Resp 18  SpO2 100%  Physical Exam  Nursing note and vitals reviewed. Constitutional: She is oriented to person, place, and time. She appears well-developed and well-nourished. No distress.  HENT:  Head: Normocephalic and atraumatic.  Eyes: Conjunctivae and EOM are normal. Pupils are equal, round, and reactive to light.  Abdominal: Soft. Bowel sounds are normal. She exhibits no distension and no mass. There is no tenderness. There is no rebound and no guarding.       No CVT  Neurological: She is alert and oriented to person, place, and time.  Skin: No rash noted.    ED Course  Procedures (including critical care time)  Labs Reviewed  POCT URINALYSIS DIP (DEVICE) - Abnormal; Notable for the following:    Hgb urine dipstick LARGE (*)     Protein, ur 30 (*)  Nitrite POSITIVE (*)     Leukocytes, UA LARGE (*)  Biochemical Testing Only. Please order routine urinalysis from main lab if confirmatory testing is needed.   All other components within normal limits  URINE CULTURE   No results found.   1. Lower urinary tract infection       MDM  Non-complicated low urinary tract infection. Treated with Cipro. Urine culture pending..  Note update: urine culture came positive for >100000 col E.coli. Resistant to Cipro antibiotic was changed to Septra DS.        Sharin Grave, MD 06/29/11 1610

## 2011-09-04 ENCOUNTER — Ambulatory Visit (INDEPENDENT_AMBULATORY_CARE_PROVIDER_SITE_OTHER): Payer: Managed Care, Other (non HMO) | Admitting: Family Medicine

## 2011-09-04 ENCOUNTER — Encounter: Payer: Self-pay | Admitting: Family Medicine

## 2011-09-04 VITALS — BP 143/83 | HR 70 | Temp 98.4°F | Wt 171.0 lb

## 2011-09-04 DIAGNOSIS — R519 Headache, unspecified: Secondary | ICD-10-CM | POA: Insufficient documentation

## 2011-09-04 DIAGNOSIS — R51 Headache: Secondary | ICD-10-CM

## 2011-09-04 MED ORDER — IBUPROFEN 800 MG PO TABS: 800.0000 mg | ORAL_TABLET | Freq: Three times a day (TID) | ORAL | Status: AC | PRN

## 2011-09-04 NOTE — Patient Instructions (Addendum)
Take the Ibuprofen up to 3 times a day with food if you're having pain. I don't think the headaches are from your blood pressure. If you're still having issues next week come back and see [place].    It was good to meet you!

## 2011-09-04 NOTE — Progress Notes (Signed)
  Subjective:    Patient ID: Katelyn [name], female    DOB: April 26, 1975, 36 y.o.   MRN: [id number]  HPI  1.  Headaches:  Present since Monday (4 days ago).  Describes both right and left sided pain, can occur either side and is described as both sharp stabbing pain as well as dull ache at baseline.  Not sleeping well due to pain.  She is taking Ibuprofen 2 pills twice a day with moderate relief.  No nausea.  Phonophobia, some photophobia.  No recent trauma or injuries.  No fevers or chills.  No paresthesias or weakness.    Does endorse some blurry vision when these headaches. She has been checking her blood pressure during these incidents and it was 170 systolic the other day, otherwise WNL.    Review of Systems See HPI above for review of systems.       Objective:   Physical Exam  Gen:  Alert, cooperative patient who appears stated age in no acute distress.  Vital signs reviewed. Head:  [place]/AT Eyes:  EOMI, PERRL.  Sclera and conjunctiva non-erythematous and non-icteric.  Fundoscopy good with crisp cup to disc margins.  Nose:  Nares patent Ears:  Canals clear BL.  TM's pearly gray BL without effusion or retraction Mouth:  MMM.  Tonsils +2 BL without erythema or edema noted Neck:  No lymphadenopathy noted Cardiac:  Regular rate and rhythm without murmur auscultated.  Good S1/S2. Pulm:  Clear to auscultation bilaterally with good air movement.  No wheezes or rales noted.   Neuro:  Alert and oriented to person, place, and date.  CN II-XII intact.  No focal deficits noted.           Assessment & Plan:

## 2011-09-04 NOTE — Assessment & Plan Note (Signed)
Likely tension headache.  No red flags by history or physical.   No personal or family history of migraines. Reassured patient that blood pressure often elevates during periods of pain.   We reviewed her blood pressure over past year - baseline is 120 - 140s. Ibuprofen for relief.   FU next week if no improvement, sooner if worsening.  I provided the patient with explicit warnings and red flags that would prompt return to clinic or the ED.

## 2012-01-20 ENCOUNTER — Encounter: Payer: Self-pay | Admitting: Family Medicine

## 2012-01-20 ENCOUNTER — Ambulatory Visit (INDEPENDENT_AMBULATORY_CARE_PROVIDER_SITE_OTHER): Payer: Managed Care, Other (non HMO) | Admitting: Family Medicine

## 2012-01-20 VITALS — BP 149/100 | HR 64 | Temp 97.9°F | Ht 67.0 in | Wt 169.5 lb

## 2012-01-20 DIAGNOSIS — R51 Headache: Secondary | ICD-10-CM

## 2012-01-20 DIAGNOSIS — F411 Generalized anxiety disorder: Secondary | ICD-10-CM

## 2012-01-20 DIAGNOSIS — I10 Essential (primary) hypertension: Secondary | ICD-10-CM

## 2012-01-20 MED ORDER — SERTRALINE HCL 25 MG PO TABS: 50.0000 mg | ORAL_TABLET | Freq: Every day | ORAL | Status: DC

## 2012-01-20 NOTE — Patient Instructions (Signed)
Nice to meet you. Your headache maybe stress or blood pressure related. You may try tylenol or motrin/aleve if this returns-but not more than 2-3 times per week. If headaches persist after restarting BP medication, make another appointment. Do your best with good sleep, rest, exercise, drinking water, avoiding caffeine and alcohol.   Tension Headache A tension headache is a feeling of pain, pressure, or aching often felt over the front and sides of the head. The pain can be dull or can feel tight (constricting). It is the most common type of headache. Tension headaches are not normally associated with nausea or vomiting and do not get worse with physical activity. Tension headaches can last 30 minutes to several days.  CAUSES  The exact cause is not known, but it may be caused by chemicals and hormones in the brain that lead to pain. Tension headaches often begin after stress, anxiety, or depression. Other triggers may include:  Alcohol.  Caffeine (too much or withdrawal).  Respiratory infections (colds, flu, sinus infections).  Dental problems or teeth clenching.  Fatigue.  Holding your head and neck in one position too long while using a computer. SYMPTOMS   Pressure around the head.   Dull, aching head pain.   Pain felt over the front and sides of the head.   Tenderness in the muscles of the head, neck, and shoulders. DIAGNOSIS  A tension headache is often diagnosed based on:   Symptoms.   Physical examination.   A CT scan or MRI of your head. These tests may be ordered if symptoms are severe or unusual. TREATMENT  Medicines may be given to help relieve symptoms.  HOME CARE INSTRUCTIONS   Only take over-the-counter or prescription medicines for pain or discomfort as directed by your caregiver.   Lie down in a dark, quiet room when you have a headache.   Keep a journal to find out what may be triggering your headaches. For example, write down:  What you eat  and drink.  How much sleep you get.  Any change to your diet or medicines.  Try massage or other relaxation techniques.   Ice packs or heat applied to the head and neck can be used. Use these 3 to 4 times per day for 15 to 20 minutes each time, or as needed.   Limit stress.   Sit up straight, and do not tense your muscles.   Quit smoking if you smoke.  Limit alcohol use.  Decrease the amount of caffeine you drink, or stop drinking caffeine.  Eat and exercise regularly.  Get 7 to 9 hours of sleep, or as recommended by your caregiver.  Avoid excessive use of pain medicine as recurrent headaches can occur.  SEEK MEDICAL CARE IF:   You have problems with the medicines you were prescribed.  Your medicines do not work.  You have a change from the usual headache.  You have nausea or vomiting. SEEK IMMEDIATE MEDICAL CARE IF:   Your headache becomes severe.  You have a fever.  You have a stiff neck.  You have loss of vision.  You have muscular weakness or loss of muscle control.  You lose your balance or have trouble walking.  You feel faint or pass out.  You have severe symptoms that are different from your first symptoms. MAKE SURE YOU:   Understand these instructions.  Will watch your condition.  Will get help right away if you are not doing well or get worse. Document Released: 12/23/2004  Document Revised: 03/17/2011 Document Reviewed: 12/13/2010 [facility] Patient Information 2013 [place], [place].

## 2012-01-21 NOTE — Assessment & Plan Note (Signed)
Elevated off medication, could possibly be making HA worse. Advised resume medication, I offered her a trial of different agent to avoid diuresis affect, but she refused today. Advised f/u PCP in 2-4 weeks or sooner if symptoms fail to improve.

## 2012-01-21 NOTE — Assessment & Plan Note (Addendum)
Most likely tension headache vs sinus related. Slight elevated BP, but no red flags on exam, not c/w migraine. Will treat with toradol and recommend tylenol or motrin for abortive therapy. Advised to RTC for red flags or if not improved in next few days.

## 2012-01-21 NOTE — Progress Notes (Signed)
  Subjective:    Patient ID: Katelyn [name], female    DOB: 29-Dec-1975, 37 y.o.   MRN: [id number]  HPI  1. Headache. Mild for past one week, intermittent. Right side fronto-temporal. Resolves with tylenol. Worse for past two days, but has not been taking her BP med because it makes her urinate during work too much. She did have a URI about 10 days ago and recovered now. Decongestant didn't help. Has slight lightheadedness yesterday.    Has previously had tension headaches, no head injury.   Review of Systems Denies vision changes, photophobia, phonophobia, nausea, emesis, syncope, weakness, numbness, fever, chills, purulent rhinorrhea, weight loss, head injury, jaw claudication, rash, neck pain/stiffness.     Objective:   Physical Exam  Vitals reviewed. Constitutional: She is oriented to person, place, and time. She appears well-developed and well-nourished. No distress.  HENT:  Head: Normocephalic and atraumatic.  Right Ear: External ear normal.  Left Ear: External ear normal.  Nose: Nose normal.  Mouth/Throat: Oropharynx is clear and moist. No oropharyngeal exudate.       No jaw, sinus or head TTP.  Eyes: EOM are normal. Pupils are equal, round, and reactive to light.       Fundi appear benign  Neck: Neck supple. No thyromegaly present.  Cardiovascular: Normal rate, regular rhythm and normal heart sounds.   No murmur heard. Pulmonary/Chest: Effort normal and breath sounds normal. No respiratory distress. She has no wheezes.  Musculoskeletal: She exhibits no edema.  Lymphadenopathy:    She has no cervical adenopathy.  Neurological: She is alert and oriented to person, place, and time. No cranial nerve deficit. She exhibits normal muscle tone. Coordination normal.       Normal finger nose, normal gait.  Skin: No rash noted. She is not diaphoretic.  Psychiatric: She has a normal mood and affect.       Smiles, appears well       Assessment & Plan:

## 2012-02-21 ENCOUNTER — Emergency Department (HOSPITAL_COMMUNITY)
Admission: EM | Admit: 2012-02-21 | Discharge: 2012-02-21 | Disposition: A | Discharge status: Home / Self Care | Payer: Managed Care, Other (non HMO) | Attending: Emergency Medicine | Admitting: Emergency Medicine

## 2012-02-21 ENCOUNTER — Encounter (HOSPITAL_COMMUNITY): Payer: Self-pay | Admitting: Emergency Medicine

## 2012-02-21 DIAGNOSIS — K529 Noninfective gastroenteritis and colitis, unspecified: Secondary | ICD-10-CM

## 2012-02-21 DIAGNOSIS — F17200 Nicotine dependence, unspecified, uncomplicated: Secondary | ICD-10-CM | POA: Insufficient documentation

## 2012-02-21 DIAGNOSIS — Z3202 Encounter for pregnancy test, result negative: Secondary | ICD-10-CM | POA: Insufficient documentation

## 2012-02-21 DIAGNOSIS — Z8659 Personal history of other mental and behavioral disorders: Secondary | ICD-10-CM | POA: Insufficient documentation

## 2012-02-21 DIAGNOSIS — Z8679 Personal history of other diseases of the circulatory system: Secondary | ICD-10-CM | POA: Insufficient documentation

## 2012-02-21 DIAGNOSIS — Z79899 Other long term (current) drug therapy: Secondary | ICD-10-CM | POA: Insufficient documentation

## 2012-02-21 DIAGNOSIS — Z862 Personal history of diseases of the blood and blood-forming organs and certain disorders involving the immune mechanism: Secondary | ICD-10-CM | POA: Insufficient documentation

## 2012-02-21 DIAGNOSIS — K5289 Other specified noninfective gastroenteritis and colitis: Secondary | ICD-10-CM | POA: Insufficient documentation

## 2012-02-21 DIAGNOSIS — I10 Essential (primary) hypertension: Secondary | ICD-10-CM | POA: Insufficient documentation

## 2012-02-21 DIAGNOSIS — R112 Nausea with vomiting, unspecified: Secondary | ICD-10-CM | POA: Insufficient documentation

## 2012-02-21 LAB — CBC WITH DIFFERENTIAL/PLATELET
Eosinophils Absolute: 0.5 10*3/uL (ref 0.0–0.7)
Lymphocytes Relative: 12 % (ref 12–46)
Lymphs Abs: 1.8 10*3/uL (ref 0.7–4.0)
Neutro Abs: 11.9 10*3/uL — ABNORMAL HIGH (ref 1.7–7.7)
Neutrophils Relative %: 78 % — ABNORMAL HIGH (ref 43–77)
Platelets: 192 10*3/uL (ref 150–400)
RBC: 4.78 MIL/uL (ref 3.87–5.11)
WBC: 15.2 10*3/uL — ABNORMAL HIGH (ref 4.0–10.5)

## 2012-02-21 LAB — COMPREHENSIVE METABOLIC PANEL
ALT: 117 U/L — ABNORMAL HIGH (ref 0–35)
Alkaline Phosphatase: 99 U/L (ref 39–117)
CO2: 25 mEq/L (ref 19–32)
GFR calc Af Amer: >90 mL/min (ref >90)
Glucose, Bld: 91 mg/dL (ref 70–99)
Potassium: 3.5 mEq/L (ref 3.5–5.1)
Sodium: 138 mEq/L (ref 135–145)
Total Protein: 7.2 g/dL (ref 6.0–8.3)

## 2012-02-21 LAB — POCT PREGNANCY, URINE: Preg Test, Ur: NEGATIVE

## 2012-02-21 LAB — URINALYSIS, MICROSCOPIC ONLY
Bilirubin Urine: NEGATIVE
Hgb urine dipstick: NEGATIVE
Specific Gravity, Urine: 1.026 (ref 1.005–1.030)
Urobilinogen, UA: 1 mg/dL (ref 0.0–1.0)

## 2012-02-21 LAB — POCT I-STAT TROPONIN I: Troponin i, poc: 0 ng/mL (ref 0.00–0.08)

## 2012-02-21 MED ORDER — SODIUM CHLORIDE 0.9 % IV BOLUS (SEPSIS)
1000.0000 mL | Freq: Once | INTRAVENOUS | Status: AC
  Administered 2012-02-21: 1000 mL via INTRAVENOUS

## 2012-02-21 MED ORDER — PROMETHAZINE HCL 25 MG PO TABS: 25.0000 mg | ORAL_TABLET | Freq: Four times a day (QID) | ORAL | Status: DC | PRN

## 2012-02-21 MED ORDER — KETOROLAC TROMETHAMINE 30 MG/ML IJ SOLN
30.0000 mg | Freq: Once | INTRAMUSCULAR | Status: AC
  Administered 2012-02-21: 30 mg via INTRAVENOUS
  Filled 2012-02-21: qty 1

## 2012-02-21 MED ORDER — ONDANSETRON HCL 4 MG/2ML IJ SOLN
4.0000 mg | Freq: Once | INTRAMUSCULAR | Status: AC
  Administered 2012-02-21: 4 mg via INTRAVENOUS
  Filled 2012-02-21: qty 2

## 2012-02-21 MED ORDER — TRAMADOL HCL 50 MG PO TABS: 50.0000 mg | ORAL_TABLET | Freq: Four times a day (QID) | ORAL | Status: DC | PRN

## 2012-02-21 NOTE — ED Notes (Signed)
Pt dc to home.  Pt states understanding to dc paperwork.  Pt ambulatory to exit.  Denies need for w/c.

## 2012-02-21 NOTE — ED Provider Notes (Signed)
History     CSN: [id number]  Arrival date & time 02/21/12  0136   First MD Initiated Contact with Patient 02/21/12 [phone]      Chief Complaint  Patient presents with  . Abdominal Pain  . Emesis    (Consider location/radiation/quality/duration/timing/severity/associated sxs/prior treatment) Patient is a 37 y.o. female presenting with abdominal pain and vomiting. The history is provided by the patient (the pt complains of abd pain and diarhea). No language interpreter was used.  Abdominal Pain Pain location:  Generalized Pain quality: aching   Pain radiates to:  Does not radiate Pain severity:  Moderate Onset quality:  Gradual Timing:  Intermittent Chronicity:  New Context: not alcohol use   Associated symptoms: diarrhea and vomiting   Associated symptoms: no chest pain, no cough, no fatigue and no hematuria   Emesis Associated symptoms: abdominal pain and diarrhea   Associated symptoms: no headaches     Past Medical History  Diagnosis Date  . Hypertension   . Palpitations   . Chest pain   . Anemia   . Anxiety   . Depression     Past Surgical History  Procedure Laterality Date  . Hysteroscopy w/ endometrial ablation    . Tubal ligation    . Abdominal hysterectomy      Family History  Problem Relation Age of Onset  . Diabetes Mother   . Depression Mother   . Depression Sister     History  Substance Use Topics  . Smoking status: Current Every Day Smoker -- 0.50 packs/day    Types: Cigarettes  . Smokeless tobacco: Never Used  . Alcohol Use: 2.0 oz/week    4 drink(s) per week     Comment: occasional    OB History   Grav Para Term Preterm Abortions TAB SAB Ect Mult Living                  Review of Systems  Constitutional: Negative for fatigue.  HENT: Negative for congestion, sinus pressure and ear discharge.   Eyes: Negative for discharge.  Respiratory: Negative for cough.   Cardiovascular: Negative for chest pain.  Gastrointestinal: Positive for  vomiting, abdominal pain and diarrhea.  Genitourinary: Negative for frequency and hematuria.  Musculoskeletal: Negative for back pain.  Skin: Negative for rash.  Neurological: Negative for seizures and headaches.  Psychiatric/Behavioral: Negative for hallucinations.    Allergies  Review of patient's allergies indicates no known allergies.  Home Medications   Current Outpatient Rx  Name  Route  Sig  Dispense  Refill  . lisinopril-hydrochlorothiazide (PRINZIDE,ZESTORETIC) 20-25 MG per tablet   Oral   Take 1 tablet by mouth daily.         . sertraline (ZOLOFT) 25 MG tablet   Oral   Take 2 tablets (50 mg total) by mouth daily.   60 tablet   1   . promethazine (PHENERGAN) 25 MG tablet   Oral   Take 1 tablet (25 mg total) by mouth every 6 (six) hours as needed for nausea.   15 tablet   0   . traMADol (ULTRAM) 50 MG tablet   Oral   Take 1 tablet (50 mg total) by mouth every 6 (six) hours as needed for pain.   15 tablet   0     BP 188/112  Pulse 85  Temp(Src) 98.7 F (37.1 C) (Oral)  Resp 16  SpO2 98%  Physical Exam  Constitutional: She is oriented to person, place, and time. She appears well-developed.  HENT:  Head: Normocephalic and atraumatic.  Eyes: Conjunctivae and EOM are normal. No scleral icterus.  Neck: Neck supple. No thyromegaly present.  Cardiovascular: Normal rate and regular rhythm.  Exam reveals no gallop and no friction rub.   No murmur heard. Pulmonary/Chest: No stridor. She has no wheezes. She has no rales. She exhibits no tenderness.  Abdominal: She exhibits no distension. There is tenderness. There is no rebound.  Mild epigastric tenderness  Musculoskeletal: Normal range of motion. She exhibits no edema.  Lymphadenopathy:    She has no cervical adenopathy.  Neurological: She is oriented to person, place, and time. Coordination normal.  Skin: No rash noted. No erythema.  Psychiatric: She has a normal mood and affect. Her behavior is normal.     ED Course  Procedures (including critical care time)  Labs Reviewed  CBC WITH DIFFERENTIAL - Abnormal; Notable for the following:    WBC 15.2 (*)    Hemoglobin 15.3 (*)    MCHC 36.5 (*)    Neutrophils Relative 78 (*)    Neutro Abs 11.9 (*)    All other components within normal limits  COMPREHENSIVE METABOLIC PANEL - Abnormal; Notable for the following:    AST 300 (*)    ALT 117 (*)    All other components within normal limits  URINALYSIS, MICROSCOPIC ONLY - Abnormal; Notable for the following:    APPearance CLOUDY (*)    Squamous Epithelial / LPF MANY (*)    All other components within normal limits  LIPASE, BLOOD  POCT PREGNANCY, URINE  POCT I-STAT TROPONIN I   No results found.   1. Gastroenteritis       MDM  Pt improved with tx.  Will follow up with her md this week for elevated liver studies        Benny Lennert, MD 02/21/12 [phone]

## 2012-02-21 NOTE — ED Notes (Signed)
Reports generalized abd pain and diarrhea that started at 5pm.  States pain is now to upper abd with nausea and vomiting.

## 2012-04-12 ENCOUNTER — Emergency Department (HOSPITAL_COMMUNITY): Payer: Managed Care, Other (non HMO)

## 2012-04-12 ENCOUNTER — Encounter (HOSPITAL_COMMUNITY): Payer: Self-pay | Admitting: Emergency Medicine

## 2012-04-12 ENCOUNTER — Emergency Department (HOSPITAL_COMMUNITY)
Admission: EM | Admit: 2012-04-12 | Discharge: 2012-04-12 | Disposition: A | Discharge status: Home / Self Care | Payer: Managed Care, Other (non HMO) | Attending: Emergency Medicine | Admitting: Emergency Medicine

## 2012-04-12 DIAGNOSIS — I10 Essential (primary) hypertension: Secondary | ICD-10-CM | POA: Insufficient documentation

## 2012-04-12 DIAGNOSIS — Z862 Personal history of diseases of the blood and blood-forming organs and certain disorders involving the immune mechanism: Secondary | ICD-10-CM | POA: Insufficient documentation

## 2012-04-12 DIAGNOSIS — F17200 Nicotine dependence, unspecified, uncomplicated: Secondary | ICD-10-CM | POA: Insufficient documentation

## 2012-04-12 DIAGNOSIS — Z79899 Other long term (current) drug therapy: Secondary | ICD-10-CM | POA: Insufficient documentation

## 2012-04-12 DIAGNOSIS — F411 Generalized anxiety disorder: Secondary | ICD-10-CM | POA: Insufficient documentation

## 2012-04-12 DIAGNOSIS — F329 Major depressive disorder, single episode, unspecified: Secondary | ICD-10-CM | POA: Insufficient documentation

## 2012-04-12 DIAGNOSIS — R51 Headache: Secondary | ICD-10-CM

## 2012-04-12 DIAGNOSIS — Z8679 Personal history of other diseases of the circulatory system: Secondary | ICD-10-CM | POA: Insufficient documentation

## 2012-04-12 DIAGNOSIS — R42 Dizziness and giddiness: Secondary | ICD-10-CM | POA: Insufficient documentation

## 2012-04-12 DIAGNOSIS — F3289 Other specified depressive episodes: Secondary | ICD-10-CM | POA: Insufficient documentation

## 2012-04-12 LAB — POCT I-STAT, CHEM 8
Calcium, Ion: 1.2 mmol/L (ref 1.12–1.23)
Glucose, Bld: 89 mg/dL (ref 70–99)
HCT: 47 % — ABNORMAL HIGH (ref 36.0–46.0)
Hemoglobin: 16 g/dL — ABNORMAL HIGH (ref 12.0–15.0)
Potassium: 3.4 mEq/L — ABNORMAL LOW (ref 3.5–5.1)
TCO2: 30 mmol/L (ref 0–100)

## 2012-04-12 MED ORDER — METOCLOPRAMIDE HCL 5 MG/ML IJ SOLN
10.0000 mg | Freq: Once | INTRAMUSCULAR | Status: AC
  Administered 2012-04-12: 10 mg via INTRAVENOUS
  Filled 2012-04-12: qty 2

## 2012-04-12 MED ORDER — SODIUM CHLORIDE 0.9 % IV SOLN
INTRAVENOUS | Status: DC
  Administered 2012-04-12: 20 mL/h via INTRAVENOUS

## 2012-04-12 MED ORDER — AMLODIPINE BESYLATE 5 MG PO TABS
5.0000 mg | ORAL_TABLET | Freq: Every day | ORAL | Status: DC
  Administered 2012-04-12: 5 mg via ORAL
  Filled 2012-04-12: qty 1

## 2012-04-12 MED ORDER — DIPHENHYDRAMINE HCL 50 MG/ML IJ SOLN
25.0000 mg | Freq: Once | INTRAMUSCULAR | Status: AC
  Administered 2012-04-12: 25 mg via INTRAVENOUS
  Filled 2012-04-12: qty 1

## 2012-04-12 MED ORDER — AMLODIPINE BESYLATE 10 MG PO TABS: 5.0000 mg | ORAL_TABLET | Freq: Every day | ORAL | Status: DC

## 2012-04-12 NOTE — ED Provider Notes (Signed)
History     CSN: [id number]  Arrival date & time 04/12/12  1002   First MD Initiated Contact with Patient 04/12/12 1011      Chief Complaint  Patient presents with  . Headache  . Dizziness    (Consider location/radiation/quality/duration/timing/severity/associated sxs/prior treatment) HPI Comments: H/o reccurent HA a/w HTN--pt notes that current HA is similar--denies unilateral weakness, chest pain, sob, confusion  Patient is a 37 y.o. female presenting with headaches. The history is provided by the patient.  Headache Pain location:  L parietal Quality:  Sharp Radiates to:  Does not radiate Severity currently:  5/10 Severity at highest:  5/10 Onset quality:  Gradual Duration:  3 days Timing:  Constant Progression:  Worsening Chronicity:  Recurrent Similar to prior headaches: yes   Context: not exposure to bright light and not loud noise   Relieved by:  Nothing Worsened by:  Nothing tried Ineffective treatments:  None tried Associated symptoms: no fever, no neck pain, no neck stiffness and no sinus pressure     Past Medical History  Diagnosis Date  . Hypertension   . Palpitations   . Chest pain   . Anemia   . Anxiety   . Depression     Past Surgical History  Procedure Laterality Date  . Hysteroscopy w/ endometrial ablation    . Tubal ligation    . Abdominal hysterectomy      Family History  Problem Relation Age of Onset  . Diabetes Mother   . Depression Mother   . Depression Sister     History  Substance Use Topics  . Smoking status: Current Every Day Smoker -- 0.50 packs/day    Types: Cigarettes  . Smokeless tobacco: Never Used  . Alcohol Use: 2.0 oz/week    4 drink(s) per week     Comment: occasional    OB History   Grav Para Term Preterm Abortions TAB SAB Ect Mult Living                  Review of Systems  Constitutional: Negative for fever.  HENT: Negative for neck pain, neck stiffness and sinus pressure.   Neurological: Positive for  headaches.  All other systems reviewed and are negative.    Allergies  Review of patient's allergies indicates no known allergies.  Home Medications   Current Outpatient Rx  Name  Route  Sig  Dispense  Refill  . lisinopril-hydrochlorothiazide (PRINZIDE,ZESTORETIC) 20-25 MG per tablet   Oral   Take 1 tablet by mouth daily.         . promethazine (PHENERGAN) 25 MG tablet   Oral   Take 1 tablet (25 mg total) by mouth every 6 (six) hours as needed for nausea.   15 tablet   0   . sertraline (ZOLOFT) 25 MG tablet   Oral   Take 2 tablets (50 mg total) by mouth daily.   60 tablet   1   . traMADol (ULTRAM) 50 MG tablet   Oral   Take 1 tablet (50 mg total) by mouth every 6 (six) hours as needed for pain.   15 tablet   0     BP 190/128  Pulse 95  Temp(Src) 98.4 F (36.9 C) (Oral)  Resp 18  SpO2 100%  Physical Exam  Nursing note and vitals reviewed. Constitutional: She is oriented to person, place, and time. She appears well-developed and well-nourished.  Non-toxic appearance. No distress.  HENT:  Head: Normocephalic and atraumatic.  Eyes: Conjunctivae, EOM  and lids are normal. Pupils are equal, round, and reactive to light.  Neck: Normal range of motion. Neck supple. No tracheal deviation present. No mass present.  Cardiovascular: Normal rate, regular rhythm and normal heart sounds.  Exam reveals no gallop.   No murmur heard. Pulmonary/Chest: Effort normal and breath sounds normal. No stridor. No respiratory distress. She has no decreased breath sounds. She has no wheezes. She has no rhonchi. She has no rales.  Abdominal: Soft. Normal appearance and bowel sounds are normal. She exhibits no distension. There is no tenderness. There is no rebound and no CVA tenderness.  Musculoskeletal: Normal range of motion. She exhibits no edema and no tenderness.  Neurological: She is alert and oriented to person, place, and time. She has normal strength. No cranial nerve deficit or  sensory deficit. GCS eye subscore is 4. GCS verbal subscore is 5. GCS motor subscore is 6.  Skin: Skin is warm and dry. No abrasion and no rash noted.  Psychiatric: She has a normal mood and affect. Her speech is normal and behavior is normal.    ED Course  Procedures (including critical care time)  Labs Reviewed - No data to display No results found.   No diagnosis found.    MDM  Patient given medications for headache including analgesics as well as antihypertensives. Head CT negative. Recheck after medications shows a stable neurological exam. She'll be started on Norvasc and will followup with her Dr. Blood pressure has improved  Toy Baker, MD 04/12/12 1332

## 2012-04-12 NOTE — ED Notes (Signed)
Pt c/o HA x 2 days with nausea, dizziness and blurry vision; pt sts pain on left side of head

## 2012-04-15 ENCOUNTER — Ambulatory Visit (INDEPENDENT_AMBULATORY_CARE_PROVIDER_SITE_OTHER): Payer: Managed Care, Other (non HMO) | Admitting: Family Medicine

## 2012-04-15 ENCOUNTER — Encounter: Payer: Self-pay | Admitting: Family Medicine

## 2012-04-15 VITALS — BP 145/95 | HR 83 | Temp 99.1°F | Ht 67.0 in | Wt 171.0 lb

## 2012-04-15 DIAGNOSIS — R110 Nausea: Secondary | ICD-10-CM

## 2012-04-15 DIAGNOSIS — R51 Headache: Secondary | ICD-10-CM

## 2012-04-15 DIAGNOSIS — I10 Essential (primary) hypertension: Secondary | ICD-10-CM

## 2012-04-15 MED ORDER — ONDANSETRON HCL 4 MG PO TABS: 4.0000 mg | ORAL_TABLET | Freq: Three times a day (TID) | ORAL | Status: DC | PRN

## 2012-04-15 NOTE — Assessment & Plan Note (Addendum)
By pt's description of symptoms it seems most likely migrainous headache. Recent CT head negative. No current HA at the time of evaluation today. P/ - Analgesic and antiemetic treatment discussed.  - Recommended to keep a log of frequency and symptoms and follow up with PCP. - Pt may benefit from BB (metoprolol) to dually treat HTN and HA, if prophylactic treatment is necessary.

## 2012-04-15 NOTE — Assessment & Plan Note (Signed)
On Lisinopril/ HCTZ (20/25), and started on Norvasc 10 mg 3 days ago. Good response compared with prior readings.Possible HTN get exacerbated when pt has headaches.  P/ - Continue with current therapy and follow up with PCP

## 2012-04-15 NOTE — Patient Instructions (Addendum)
Migraine Headache A migraine headache is an intense, throbbing pain on one or both sides of your head. A migraine can last for 30 minutes to several hours. CAUSES  The exact cause of a migraine headache is not always known. However, a migraine may be caused when nerves in the brain become irritated and release chemicals that cause inflammation. This causes pain. SYMPTOMS  Pain on one or both sides of your head.  Pulsating or throbbing pain.  Severe pain that prevents daily activities.  Pain that is aggravated by any physical activity.  Nausea, vomiting, or both.  Dizziness.  Pain with exposure to bright lights, loud noises, or activity.  General sensitivity to bright lights, loud noises, or smells. Before you get a migraine, you may get warning signs that a migraine is coming (aura). An aura may include:  Seeing flashing lights.  Seeing bright spots, halos, or zig-zag lines.  Having tunnel vision or blurred vision.  Having feelings of numbness or tingling.  Having trouble talking.  Having muscle weakness. MIGRAINE TRIGGERS  Alcohol.  Smoking.  Stress.  Menstruation.  Aged cheeses.  Foods or drinks that contain nitrates, glutamate, aspartame, or tyramine.  Lack of sleep.  Chocolate.  Caffeine.  Hunger.  Physical exertion.  Fatigue.  Medicines used to treat chest pain (nitroglycerine), birth control pills, estrogen, and some blood pressure medicines. DIAGNOSIS  A migraine headache is often diagnosed based on:  Symptoms.  Physical examination.  A CT scan or MRI of your head. TREATMENT Medicines may be given for pain and nausea. Medicines can also be given to help prevent recurrent migraines.  HOME CARE INSTRUCTIONS  Only take over-the-counter or prescription medicines for pain or discomfort as directed by your caregiver. The use of long-term narcotics is not recommended.  Lie down in a dark, quiet room when you have a migraine.  Keep a journal  to find out what may trigger your migraine headaches. For example, write down:  What you eat and drink.  How much sleep you get.  Any change to your diet or medicines.  Limit alcohol consumption.  Quit smoking if you smoke.  Get 7 to 9 hours of sleep, or as recommended by your caregiver.  Limit stress.  Keep lights dim if bright lights bother you and make your migraines worse. SEEK IMMEDIATE MEDICAL CARE IF:   Your migraine becomes severe.  You have a fever.  You have a stiff neck.  You have vision loss.  You have muscular weakness or loss of muscle control.  You start losing your balance or have trouble walking.  You feel faint or pass out.  You have severe symptoms that are different from your first symptoms. MAKE SURE YOU:   Understand these instructions.  Will watch your condition.  Will get help right away if you are not doing well or get worse. Document Released: 12/23/2004 Document Revised: 03/17/2011 Document Reviewed: 12/13/2010 [facility] Patient Information 2013 [place], [place].  It has been a pleasure to see you today. Please continue taking the medications as prescribed.

## 2012-04-15 NOTE — Progress Notes (Signed)
Family Medicine Office Visit Note   Subjective:   Patient ID: Katelyn [name], female  DOB: [birth year], 37 y.o.. MRN: [id number]   Pt that comes today to follow up BP. She has been to ED recently (04/12/12) due to headache. Imaging and labs were wnl but pt BP was found to be elevated. She was recommended to start Norvasc. Pt reports compliance and denies noted side effects of medication.  She comes today reporting feeling well with no current complaints, but would like to know what to take when she has her headaches and feels nauseated. Headaches are being happening for a while (more than 3 months) they are reported to be in different areas of her head, sometimes frontal, others "on the middle of the head" they are usually pulsatile and associated to some degree of tingling sensation on face, sometimes on the right side, sometimes on left. She also reports nausea without vomiting and photophobia when having HA. She has taken [employer] and this seems to help.  Denies fever or chills.  Review of Systems:  Per HPI   Objective:   Physical Exam: Gen:  NAD HEENT: Moist mucous membranes. Eyes: normal conjunctiva. Neck supple, no adenopathies. CV: Regular rate and rhythm, no murmurs rubs or gallops PULM: Clear to auscultation bilaterally. No wheezes/rales/rhonchi ABD: Soft, non tender, non distended, normal bowel sounds EXT: No edema Neuro: Alert and oriented x3. No focalization. CN II-XII intact. 5/5 Strength in all extremities. Intact sensation to pinprick and two point discrimination. Normal reflexes on upper and lower extremities. Normal coordination and gait.    Assessment & Plan:

## 2012-04-26 ENCOUNTER — Ambulatory Visit (INDEPENDENT_AMBULATORY_CARE_PROVIDER_SITE_OTHER): Payer: Managed Care, Other (non HMO) | Admitting: Family Medicine

## 2012-04-26 ENCOUNTER — Encounter: Payer: Self-pay | Admitting: Family Medicine

## 2012-04-26 VITALS — BP 177/123 | HR 76 | Ht 67.0 in | Wt 175.0 lb

## 2012-04-26 DIAGNOSIS — R7989 Other specified abnormal findings of blood chemistry: Secondary | ICD-10-CM

## 2012-04-26 DIAGNOSIS — F411 Generalized anxiety disorder: Secondary | ICD-10-CM

## 2012-04-26 DIAGNOSIS — R51 Headache: Secondary | ICD-10-CM

## 2012-04-26 DIAGNOSIS — I10 Essential (primary) hypertension: Secondary | ICD-10-CM

## 2012-04-26 HISTORY — DX: Other specified abnormal findings of blood chemistry: R79.89

## 2012-04-26 MED ORDER — SPIRONOLACTONE 25 MG PO TABS: 25.0000 mg | ORAL_TABLET | Freq: Every day | ORAL | Status: DC

## 2012-04-26 MED ORDER — SERTRALINE HCL 25 MG PO TABS: 50.0000 mg | ORAL_TABLET | Freq: Every day | ORAL | Status: DC

## 2012-04-26 NOTE — Assessment & Plan Note (Signed)
BP elevated today. Only taking lisinopril-hctz currently, but was not well controlled with addition of norvasc either.  Her potassium has been low-normal even with Ace-I.  Possibility of hyperaldosteronism contributing to resistant hypertension, will start spironolactone to see if this is helpful in controlling her blood pressure.  FOllow up in two weeks, check labwork.

## 2012-04-26 NOTE — Patient Instructions (Signed)
Thank you for coming in today, it was good to see you Start the new medication called spironolactone Follow up with me in two weeks to recheck your blood pressure and check lab work Call with questions if you have any.

## 2012-04-26 NOTE — Assessment & Plan Note (Signed)
Elevated on lab work done in February.  2:1 pattern.  Will discuss possible contributing factors including EtOH use, etc at next visit.  Recheck lab work at next visit in two weeks.

## 2012-04-26 NOTE — Progress Notes (Signed)
  Subjective:    Patient ID: Katelyn [name], female    DOB: 02-Apr-1975, 37 y.o.   MRN: [id number]  Hypertension   1. HTN:  Here for f/u of HTN.  Reports that she was dx with HTN when she was 17 and has been on lisinopril or lisinopril-hctz since that time.  Amlodipine added recently as well however she states she has been unable to tolerate this medication because it upsets her stomach.  She endorses frequent headaches, but denies chest pain, vision changes, palpitations. She continues to smoke cigarettes and does not see herself quitting anytime soon.     Review of Systems Per HPI    Objective:   Physical Exam  Constitutional: She appears well-nourished. No distress.  HENT:  Head: Atraumatic.  Eyes: Pupils are equal, round, and reactive to light.  Neck: Neck supple. No thyromegaly present.  Cardiovascular: Normal rate and regular rhythm.   Pulmonary/Chest: Effort normal.  Musculoskeletal: She exhibits no edema.  Neurological: She is alert.          Assessment & Plan:

## 2012-05-19 ENCOUNTER — Encounter: Payer: Self-pay | Admitting: Family Medicine

## 2012-05-19 ENCOUNTER — Ambulatory Visit (INDEPENDENT_AMBULATORY_CARE_PROVIDER_SITE_OTHER): Payer: Managed Care, Other (non HMO) | Admitting: Family Medicine

## 2012-05-19 VITALS — BP 204/143 | HR 71 | Ht 69.0 in | Wt 171.0 lb

## 2012-05-19 DIAGNOSIS — I10 Essential (primary) hypertension: Secondary | ICD-10-CM

## 2012-05-19 DIAGNOSIS — F17200 Nicotine dependence, unspecified, uncomplicated: Secondary | ICD-10-CM

## 2012-05-19 DIAGNOSIS — R7989 Other specified abnormal findings of blood chemistry: Secondary | ICD-10-CM

## 2012-05-19 LAB — COMPREHENSIVE METABOLIC PANEL
AST: 23 U/L (ref 0–37)
Albumin: 4.1 g/dL (ref 3.5–5.2)
Alkaline Phosphatase: 73 U/L (ref 39–117)
Chloride: 107 mEq/L (ref 96–112)
Glucose, Bld: 99 mg/dL (ref 70–99)
Potassium: 3.4 mEq/L — ABNORMAL LOW (ref 3.5–5.3)
Sodium: 136 mEq/L (ref 135–145)
Total Protein: 7 g/dL (ref 6.0–8.3)

## 2012-05-19 MED ORDER — LISINOPRIL-HYDROCHLOROTHIAZIDE 20-12.5 MG PO TABS: 2.0000 | ORAL_TABLET | Freq: Every day | ORAL | Status: DC

## 2012-05-19 NOTE — Patient Instructions (Addendum)
Thank you for coming in today, it was good to see you I am increasing your lisinopril/hctz medication  Continue spironolactone Follow up with me in 7-10 days We will set you up for an ultrasound of your kidneys

## 2012-05-20 ENCOUNTER — Ambulatory Visit (HOSPITAL_COMMUNITY): Admission: RE | Admit: 2012-05-20 | Payer: Managed Care, Other (non HMO) | Source: Ambulatory Visit

## 2012-05-20 ENCOUNTER — Emergency Department (HOSPITAL_COMMUNITY)
Admission: EM | Admit: 2012-05-20 | Discharge: 2012-05-20 | Disposition: A | Discharge status: Home / Self Care | Payer: Managed Care, Other (non HMO) | Attending: Emergency Medicine | Admitting: Emergency Medicine

## 2012-05-20 ENCOUNTER — Encounter (HOSPITAL_COMMUNITY): Payer: Self-pay

## 2012-05-20 DIAGNOSIS — I10 Essential (primary) hypertension: Secondary | ICD-10-CM | POA: Insufficient documentation

## 2012-05-20 DIAGNOSIS — F17200 Nicotine dependence, unspecified, uncomplicated: Secondary | ICD-10-CM | POA: Insufficient documentation

## 2012-05-20 DIAGNOSIS — F3289 Other specified depressive episodes: Secondary | ICD-10-CM | POA: Insufficient documentation

## 2012-05-20 DIAGNOSIS — Z79899 Other long term (current) drug therapy: Secondary | ICD-10-CM | POA: Insufficient documentation

## 2012-05-20 DIAGNOSIS — R110 Nausea: Secondary | ICD-10-CM | POA: Insufficient documentation

## 2012-05-20 DIAGNOSIS — F329 Major depressive disorder, single episode, unspecified: Secondary | ICD-10-CM | POA: Insufficient documentation

## 2012-05-20 DIAGNOSIS — Z862 Personal history of diseases of the blood and blood-forming organs and certain disorders involving the immune mechanism: Secondary | ICD-10-CM | POA: Insufficient documentation

## 2012-05-20 DIAGNOSIS — Z8679 Personal history of other diseases of the circulatory system: Secondary | ICD-10-CM | POA: Insufficient documentation

## 2012-05-20 DIAGNOSIS — F411 Generalized anxiety disorder: Secondary | ICD-10-CM | POA: Insufficient documentation

## 2012-05-20 DIAGNOSIS — R51 Headache: Secondary | ICD-10-CM | POA: Insufficient documentation

## 2012-05-20 MED ORDER — DEXAMETHASONE SODIUM PHOSPHATE 10 MG/ML IJ SOLN: 10.0000 mg | Freq: Once | INTRAMUSCULAR | Status: DC

## 2012-05-20 MED ORDER — KETOROLAC TROMETHAMINE 30 MG/ML IJ SOLN
30.0000 mg | Freq: Once | INTRAMUSCULAR | Status: AC
  Administered 2012-05-20: 30 mg via INTRAVENOUS
  Filled 2012-05-20: qty 1

## 2012-05-20 MED ORDER — METOCLOPRAMIDE HCL 5 MG/ML IJ SOLN
10.0000 mg | Freq: Once | INTRAMUSCULAR | Status: AC
  Administered 2012-05-20: 10 mg via INTRAVENOUS
  Filled 2012-05-20: qty 2

## 2012-05-20 MED ORDER — DIPHENHYDRAMINE HCL 50 MG/ML IJ SOLN
25.0000 mg | Freq: Once | INTRAMUSCULAR | Status: AC
  Administered 2012-05-20: 25 mg via INTRAVENOUS
  Filled 2012-05-20: qty 1

## 2012-05-20 NOTE — ED Notes (Signed)
NAD noted at time of d/c home 

## 2012-05-20 NOTE — Assessment & Plan Note (Signed)
Recheck lft's today to see if this has resolved.

## 2012-05-20 NOTE — Assessment & Plan Note (Signed)
Continues to smoke, encouraged to quit.  She is still contemplative.

## 2012-05-20 NOTE — Progress Notes (Signed)
  Subjective:    Patient ID: Katelyn [name], female    DOB: [birth year], 37 y.o.   MRN: [id number]  Hypertension   1. F/u H TN:  Returns today for f/u of HTN.  No self monitoring at home.  Started on spironolactone in addition to lisinopril/hctz at last appointment.  Compliant with medications.  She does report occasional headache. Reports she feels good today without headache, chest pain, vision changes.  She does continue to smoke  2. Tobacco abuse:  Continues to smoke.  Would like to quit eventually but does not think she is ready at this time.   Past Medical History  Diagnosis Date  . Hypertension   . Palpitations   . Chest pain   . Anemia   . Anxiety   . Depression     Review of Systems Per HPI    Objective:   Physical Exam  Constitutional: She appears well-nourished. No distress.  HENT:  Head: Normocephalic and atraumatic.  Neck: No thyromegaly present.  Cardiovascular: Normal rate, regular rhythm and normal heart sounds.   Musculoskeletal: She exhibits no edema.  Neurological: She is alert.          Assessment & Plan:

## 2012-05-20 NOTE — Assessment & Plan Note (Addendum)
BP remains elevated.  Manual recheck 192/130.  No improvement so far with addition of aldactone.  Still concern for hyperaldosteronism with chronically low K+.  Will also get renal duplex ultrasound to evaluate for RAS.   Will recheck lab work today to see if K has improved.  Increase Lisiniopril/HCTZ, follow up in one week.  Could consider CT abdomen to evaluate adrenals if continues to not improve.

## 2012-05-20 NOTE — ED Provider Notes (Signed)
History     CSN: [id number]  Arrival date & time 05/20/12  0740   First MD Initiated Contact with Patient 05/20/12 [phone]      Chief Complaint  Patient presents with  . Headache    (Consider location/radiation/quality/duration/timing/severity/associated sxs/prior treatment) HPI Comments: Patient with a history of HTN presents today with a chief complaint of headache.  She reports that the headache has been present since yesterday.  Headache located in the left temporal region and does not radiate.  She reports that the headache is similar to headaches that she has had in the past.  She was her PCP yesterday for this headache and had her dose of Lisinopril-HCTZ increased and she was started on Spironolactone.  She had labs drawn yesterday, which were unremarkable.  Normal Creatine.  She states that she has frequent headaches, which she attributes to her blood pressure being high.  She reports that she took her antihypertensive medication approximately one hour prior to arrival in the ED.  She denies chest pain.    Patient is a 37 y.o. female presenting with headaches. The history is provided by the patient.  Headache Pain location:  L temporal Quality:  Dull Radiates to:  Does not radiate Onset quality:  Gradual Timing:  Constant Similar to prior headaches: yes   Relieved by:  None tried Ineffective treatments:  None tried Associated symptoms: nausea   Associated symptoms: no blurred vision, no dizziness, no pain, no fever, no focal weakness, no loss of balance, no near-syncope, no neck pain, no neck stiffness, no paresthesias, no photophobia, no syncope and no vomiting     Past Medical History  Diagnosis Date  . Hypertension   . Palpitations   . Chest pain   . Anemia   . Anxiety   . Depression     Past Surgical History  Procedure Laterality Date  . Hysteroscopy w/ endometrial ablation    . Tubal ligation    . Abdominal hysterectomy      Family History  Problem Relation  Age of Onset  . Diabetes Mother   . Depression Mother   . Depression Sister     History  Substance Use Topics  . Smoking status: Current Every Day Smoker -- 0.50 packs/day    Types: Cigarettes  . Smokeless tobacco: Never Used  . Alcohol Use: 2.0 oz/week    4 drink(s) per week     Comment: occasional    OB History   Grav Para Term Preterm Abortions TAB SAB Ect Mult Living                  Review of Systems  Constitutional: Negative for fever and chills.  HENT: Negative for neck pain and neck stiffness.   Eyes: Negative for blurred vision, photophobia, pain and visual disturbance.  Cardiovascular: Negative for syncope and near-syncope.  Gastrointestinal: Positive for nausea. Negative for vomiting.  Neurological: Positive for headaches. Negative for dizziness, focal weakness, syncope, facial asymmetry, speech difficulty, weakness, paresthesias and loss of balance.  Psychiatric/Behavioral: Negative for confusion.  All other systems reviewed and are negative.    Allergies  Review of patient's allergies indicates no known allergies.  Home Medications   Current Outpatient Rx  Name  Route  Sig  Dispense  Refill  . lisinopril-hydrochlorothiazide (ZESTORETIC) 20-12.5 MG per tablet   Oral   Take 2 tablets by mouth daily.   60 tablet   3   . ondansetron (ZOFRAN) 4 MG tablet   Oral  Take 1 tablet (4 mg total) by mouth every 8 (eight) hours as needed for nausea.   20 tablet   0   . sertraline (ZOLOFT) 25 MG tablet   Oral   Take 2 tablets (50 mg total) by mouth daily.   60 tablet   1   . spironolactone (ALDACTONE) 25 MG tablet   Oral   Take 1 tablet (25 mg total) by mouth daily.   30 tablet   1     BP 148/96  Pulse 75  Temp(Src) 98.9 F (37.2 C) (Oral)  Resp 20  SpO2 100%  Physical Exam  Nursing note and vitals reviewed. Constitutional: She appears well-developed and well-nourished. No distress.  HENT:  Head: Normocephalic and atraumatic.   Mouth/Throat: Oropharynx is clear and moist.  Eyes: EOM are normal. Pupils are equal, round, and reactive to light.  Neck: Normal range of motion. Neck supple.  Cardiovascular: Normal rate, regular rhythm and normal heart sounds.   Pulmonary/Chest: Effort normal and breath sounds normal.  Neurological: She is alert. She has normal strength. No cranial nerve deficit or sensory deficit. Coordination and gait normal.  Skin: Skin is warm and dry. She is not diaphoretic.  Psychiatric: She has a normal mood and affect.    ED Course  Procedures (including critical care time)  Labs Reviewed - No data to display No results found.   No diagnosis found.  Patient reports that headache improved after given migraine cocktail.    MDM  Patient presenting today with HTN and Headache.  Headache similar to headaches that she has had in the past.  No red flags of headache.  Patient with normal neurological exam.  Headache improved with medication.  Patient also with HTN.  Blood pressure improved in the ED without medications.  Seen at PCP yesterday and was started on Spironolactone and also had dose of Lisinopril-HCTZ increased.  No signs of end organ damage at this time.  Creatine yesterday was normal.  Feel that patient stable for discharge.  Return precautions discussed.        Pascal Lux Bier, PA-C 05/20/12 [phone]

## 2012-05-20 NOTE — ED Provider Notes (Signed)
Medical screening examination/treatment/procedure(s) were conducted as a shared visit with non-physician practitioner(s) and myself.  I personally evaluated the patient during the encounter  HA improved. BP stabilized. pcp follow up. No CP or SOB  Lyanne Co, MD 05/20/12 1049

## 2012-05-20 NOTE — ED Notes (Signed)
Pt. Sleeping after receiving Benadryl.  Pt. Is driving home.  Report given to [facility].

## 2012-05-20 NOTE — ED Notes (Signed)
Pt. Is having headache that began yesterday lt. Side of her head.  Went to her doctors yesterday [name] and he increased her dosage of her Lisinipril/HCTZ and she is scheduled to have an [place] on her kidneys today.  MIld nausea and her whole body is tingling.  No neuro deficits noted.  Pt. Mild intermittent dizziness , Pt. Drove herself to ED.

## 2012-05-27 ENCOUNTER — Encounter: Payer: Self-pay | Admitting: Family Medicine

## 2012-06-02 ENCOUNTER — Encounter: Payer: Self-pay | Admitting: Family Medicine

## 2012-06-02 ENCOUNTER — Ambulatory Visit (INDEPENDENT_AMBULATORY_CARE_PROVIDER_SITE_OTHER): Payer: 59 | Admitting: Family Medicine

## 2012-06-02 VITALS — BP 191/146 | HR 69 | Ht 69.0 in | Wt 171.0 lb

## 2012-06-02 DIAGNOSIS — R5381 Other malaise: Secondary | ICD-10-CM

## 2012-06-02 DIAGNOSIS — E876 Hypokalemia: Secondary | ICD-10-CM

## 2012-06-02 DIAGNOSIS — I10 Essential (primary) hypertension: Secondary | ICD-10-CM

## 2012-06-02 DIAGNOSIS — R5383 Other fatigue: Secondary | ICD-10-CM

## 2012-06-02 LAB — TSH: TSH: 0.944 u[IU]/mL (ref 0.350–4.500)

## 2012-06-02 MED ORDER — AMLODIPINE BESYLATE 5 MG PO TABS: 5.0000 mg | ORAL_TABLET | Freq: Every day | ORAL | Status: DC

## 2012-06-02 NOTE — Patient Instructions (Addendum)
Thank you for coming in today, it was good to see you I am very concerned about your blood pressure I am going to add another medication to what you already have I am checking some additional lab work. Follow up with me in 7-10 days

## 2012-06-02 NOTE — Assessment & Plan Note (Signed)
BP remains severely elevated despite 3 medications.  Will go ahead and add amlodipine as well to try and bring BP down. Unfortunately she missed her appt for renal US, per note from dept will not be rescheduled for 2 months.  Will go ahead and check renin activity and aldosterone, although this may be inaccurate due to aldactone.  CT of abdomen ordered to evaluate adrenals.

## 2012-06-02 NOTE — Progress Notes (Signed)
  Subjective:    Patient ID: Katelyn [name], female    DOB: Oct 25, 1975, 37 y.o.   MRN: [id number]  Hypertension    1. BP: Here for BP f/u.  She has had difficult to treat HTN on three agents along with chronic hypokalemia.  She was sent for renal ultrasound for further evaluation however she did not keep her appt.  She reports that she is compliant with her medication. She does have occasional headaches but denies shortness of breath, chest pain, or vision changes.  She continues to smoke.    Review of Systems Per HPI    Objective:   Physical Exam  Constitutional: She appears well-nourished. No distress.  HENT:  Head: Normocephalic and atraumatic.  Cardiovascular: Normal rate and regular rhythm.   Pulmonary/Chest: Effort normal and breath sounds normal. No respiratory distress.          Assessment & Plan:

## 2012-06-04 ENCOUNTER — Ambulatory Visit
Admission: RE | Admit: 2012-06-04 | Discharge: 2012-06-04 | Disposition: A | Discharge status: Home / Self Care | Payer: 59 | Source: Ambulatory Visit | Attending: Family Medicine | Admitting: Family Medicine

## 2012-06-04 DIAGNOSIS — I10 Essential (primary) hypertension: Secondary | ICD-10-CM

## 2012-06-04 DIAGNOSIS — E876 Hypokalemia: Secondary | ICD-10-CM

## 2012-06-10 LAB — ALDOSTERONE + RENIN ACTIVITY W/ RATIO: ALDO / PRA Ratio: 2.7 Ratio (ref 0.9–28.9)

## 2012-06-16 ENCOUNTER — Ambulatory Visit (INDEPENDENT_AMBULATORY_CARE_PROVIDER_SITE_OTHER): Payer: 59 | Admitting: Family Medicine

## 2012-06-16 ENCOUNTER — Encounter: Payer: Self-pay | Admitting: Family Medicine

## 2012-06-16 VITALS — BP 183/123 | HR 71 | Ht 69.0 in | Wt 169.0 lb

## 2012-06-16 DIAGNOSIS — I10 Essential (primary) hypertension: Secondary | ICD-10-CM

## 2012-06-16 MED ORDER — SPIRONOLACTONE 50 MG PO TABS: 50.0000 mg | ORAL_TABLET | Freq: Every day | ORAL | Status: DC

## 2012-06-16 NOTE — Progress Notes (Signed)
  Subjective:    Patient ID: Katelyn [name], female    DOB: [birth year], 37 y.o.   MRN: [id number]  Hypertension    1. HTN:  Here for f/u of HTN.  BP remains elevated.  She does not check BP at home.  She reports that she feels quite fatigued but denies chest pain, headache, palpitations, sweats, weight loss.  She is compliant with current medical therapy.    Review of Systems Per HPI    Objective:   Physical Exam  Constitutional: She appears well-nourished. No distress.  HENT:  Head: Normocephalic and atraumatic.  Neck: Neck supple. No thyromegaly present.  Cardiovascular: Normal rate, regular rhythm and normal heart sounds.   Pulmonary/Chest: Effort normal and breath sounds normal. No respiratory distress.  Musculoskeletal: She exhibits no edema.  Neurological: She is alert.          Assessment & Plan:

## 2012-06-16 NOTE — Patient Instructions (Addendum)
Thank you for coming in today, it was good to see you Increase your spironolactone to 50mg  Follow up with me in 1 week

## 2012-06-16 NOTE — Assessment & Plan Note (Addendum)
BP continues to remain elevated.  Normal renin, aldosterone and CT without adrenal adenoma or ovarian abnormality.  I still am not sure what is causing her resistant hypertension.  Her potassium remains low despite Moderately dosed ace-i and spironolactone, will go ahead and increase spironolactone.  RAS unlikely as she has not had an increase in Cr with ace.  No symptoms today, given red flags.  Will have her f/u with Dr. Raymondo Band as well for ambulatory BP monitoring to r/o white coat hypertension as a cause.  Follow up in 1 week.

## 2012-06-21 ENCOUNTER — Ambulatory Visit (INDEPENDENT_AMBULATORY_CARE_PROVIDER_SITE_OTHER): Payer: 59 | Admitting: Pharmacist

## 2012-06-21 VITALS — BP 142/98

## 2012-06-21 DIAGNOSIS — F17200 Nicotine dependence, unspecified, uncomplicated: Secondary | ICD-10-CM

## 2012-06-21 DIAGNOSIS — I10 Essential (primary) hypertension: Secondary | ICD-10-CM

## 2012-06-21 NOTE — Progress Notes (Signed)
  Subjective:    Patient ID: Katelyn [name], female    DOB: 1975/01/21, 37 y.o.   MRN: [id number]  HPI  Katelyn [name] presented to the clinic for 24-hour ambulatory blood pressure monitoring after referral by Dr Ashley Royalty.  Medication compliance is reported to be excellent.  Discussed procedure for wearing the monitor and gave patient written instructions. Monitor was placed on non-dominant arm with instructions to return in the morning. 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   She reports that she is looking for a new job.   Patient returned to the clinic and reported no issues with monitor.     Current BP Medications Amlodipine 5mg  daily,  Lisinopril/HCTZ  20/12.5mg  daily Spironolactone 12.5mg  daily  Other antihypertensives tried in the past: N/A No Known Allergies  Age when started using tobacco on a daily basis "early 42s". Number of Cigarettes per day 10. What Medications (NRT, bupropion, varenicline) used in past includes none.   Review of Systems     Objective:   Physical Exam   Last 3 Office BP readings: 183/123 mmHg 191/146 mmHg 204/143 mmHg  Today's Office BP reading: 142/98 mmHg (manual reading) - AFTER lying on exam table (resting for > 5 minutes)  ABPM Study Data: Arm Placement left arm   Overall Mean 24hr BP:   129/85 mmHg HR: 72   Daytime Mean BP:  131/88 mmHg HR: 72   Nighttime Mean BP:  118/76 mmHg HR: 76  Dipping Pattern: yes  Sys:   9.8%   Dia: 13.7  [normal dipping ~10-20%]   Non-hypertensive ABPM thresholds: daytime BP <135/85 mmHg, sleeptime BP <120/70 mmHg NICE Hypertension Guidelines ([place]) using ABPM: Stage I: >135/85 mmHg, Stage 2: >150/95 mmHg)   BMET    Component Value Date/Time   NA 136 05/19/2012 1642   K 3.4* 05/19/2012 1642   CL 107 05/19/2012 1642   CO2 22 05/19/2012 1642   GLUCOSE 99 05/19/2012 1642   BUN 11 05/19/2012 1642   CREATININE 0.81 05/19/2012 1642   CREATININE 0.80 04/12/2012 1111   CALCIUM 9.1 05/19/2012 1642   GFRNONAA >90 02/21/2012 0200   GFRAA >90 02/21/2012 0200         Assessment & Plan:    Katelyn [name] completed a 24-hour ambulatory blood pressure study with results that appear to be consistent with hypertension during work/stress however the amount of time at work during study period was limited to 2 hours.  Her average blood pressure readings and nocturnal  dipping pattern that is normal.  No changes to medications were made at this time. Patient does report light-headedness with positional change at times.     mild Nicotine Dependence of ~15 years duration in a patient who is contemplative at this time RE tobacco cessation. Provided information on 1 800-QUIT NOW support program.  I would be happy to work with her in the future if she is interested in cessation.   Regular follow-up with PCP. Patient seen with Brigid Re, PharmD Resident.

## 2012-06-22 ENCOUNTER — Encounter: Payer: Self-pay | Admitting: Pharmacist

## 2012-06-22 ENCOUNTER — Encounter: Payer: Self-pay | Admitting: Family Medicine

## 2012-06-22 NOTE — Assessment & Plan Note (Signed)
mild Nicotine Dependence of ~15 years duration in a patient who is contemplative at this time RE tobacco cessation. Provided information on 1 800-QUIT NOW support program.  I would be happy to work with her in the future if she is interested in cessation.   Regular follow-up with PCP. Patient seen with Brigid Re, PharmD Resident.

## 2012-06-22 NOTE — Patient Instructions (Signed)
Amb BP monitor results - Near normal with exception of times during work.    It appears that your readings are elevated at work.   Appears that your blood pressure appropriately drops when you are sleeping.  Please consider working on tobacco cessation - 1 800 Quit Now.   Follow up with Dr. Ashley Royalty.

## 2012-06-22 NOTE — Assessment & Plan Note (Signed)
  Katelyn [name] completed a 24-hour ambulatory blood pressure study with results that appear to be consistent with hypertension during work/stress however the amount of time at work during study period was limited to 2 hours.  Her average blood pressure readings and nocturnal  dipping pattern that is normal.  No changes to medications were made at this time. Patient does report light-headedness with positional change at times.     mild Nicotine Dependence of ~15 years duration in a patient who is contemplative at this time RE tobacco cessation. Provided information on 1 800-QUIT NOW support program.  I would be happy to work with her in the future if she is interested in cessation.   Regular follow-up with PCP. Patient seen with Brigid Re, PharmD Resident.

## 2012-06-29 NOTE — Progress Notes (Signed)
  Subjective:    Patient ID: Katelyn [name], female    DOB: 10/23/1975, 37 y.o.   MRN: [id number]  HPI Reviewed: Agree with Dr. Macky Lower documentation and management.   Review of Systems     Objective:   Physical Exam        Assessment & Plan:

## 2012-07-02 ENCOUNTER — Ambulatory Visit (INDEPENDENT_AMBULATORY_CARE_PROVIDER_SITE_OTHER): Payer: 59 | Admitting: Family Medicine

## 2012-07-02 ENCOUNTER — Encounter: Payer: Self-pay | Admitting: Family Medicine

## 2012-07-02 VITALS — BP 165/113 | HR 74 | Ht 69.0 in | Wt 169.0 lb

## 2012-07-02 DIAGNOSIS — F411 Generalized anxiety disorder: Secondary | ICD-10-CM

## 2012-07-02 DIAGNOSIS — R51 Headache: Secondary | ICD-10-CM

## 2012-07-02 DIAGNOSIS — I10 Essential (primary) hypertension: Secondary | ICD-10-CM

## 2012-07-02 LAB — BASIC METABOLIC PANEL
CO2: 24 mEq/L (ref 19–32)
Chloride: 106 mEq/L (ref 96–112)
Creat: 0.69 mg/dL (ref 0.50–1.10)
Potassium: 3.6 mEq/L (ref 3.5–5.3)

## 2012-07-02 MED ORDER — SERTRALINE HCL 100 MG PO TABS: 100.0000 mg | ORAL_TABLET | Freq: Every day | ORAL | Status: DC

## 2012-07-02 NOTE — Patient Instructions (Addendum)
Thank you for coming in today, it was good to see you I think your elevated blood pressure is related to your stress level I will complete your FMLA paperwork and I am going to increase your sertraline as well.  Follow up in 4 weeks.

## 2012-07-04 NOTE — Progress Notes (Signed)
  Subjective:    Patient ID: Katelyn [name], female    DOB: 06/14/1975, 37 y.o.   MRN: [id number]  Hypertension    1.  HTN:  Here for follow up of HTN. Since last appt. She has underwent 24 hour ambulatory BP monitoring with results showing: Overall Mean 24hr BP: 129/85 mmHg HR: 72 Daytime Mean BP: 131/88 mmHg HR: 72 Nighttime Mean BP: 118/76 mmHg HR: 76 Dipping Pattern: yes Sys: 9.8% Dia: 13.7 [normal dipping ~10-20%] She feels like her BP seems to be more elevated when she is under the stress of being at work.  This was reflected on the ambulatory BP monitor as well.  She has had some headache that she feels is related to her BP.  She denies chest pain, palpitations, shortness of breath.    Review of Systems Per HPI    Objective:   Physical Exam  Constitutional: She is oriented to person, place, and time. She appears well-nourished. No distress.  Cardiovascular: Normal rate, regular rhythm and normal heart sounds.   Pulmonary/Chest: Effort normal and breath sounds normal. No respiratory distress.  Musculoskeletal: She exhibits no edema.  Neurological: She is alert and oriented to person, place, and time. No cranial nerve deficit.          Assessment & Plan:

## 2012-07-04 NOTE — Assessment & Plan Note (Addendum)
Her ambulatory blood pressure is consistent with elevation during work/stress period and possible and element of white coat hypertension as it is elevated again at our office.  Her average blood pressure show that most of the time she is relatively well controlled.  Discussed with her options including increase in sertraline that may be helpful with her dealing with stress and anxiety vs addition of beta blocker to help with andrenergic blockade during times of stress.  Her heart rate is in low 70's most of the time so would have to use caution with BB.  She would like to try increase in sertraline and f/u with new pcp in 4 weeks.  I will complete paperwork for intermittent FMLA.  Recheck potassium with increase in aldactone.

## 2012-07-05 ENCOUNTER — Telehealth: Payer: Self-pay | Admitting: Family Medicine

## 2012-07-05 NOTE — Telephone Encounter (Signed)
Patient dropped papers for FMLA on 6/27 to Dr. Ashley Royalty to fill out and she is wanting to see if they are ready. JW

## 2012-07-06 NOTE — Telephone Encounter (Signed)
LMOVM that FMLA papers are ready and placed up front for pick up.  Leotha Westermeyer, Darlyne Russian, CMA

## 2012-07-29 ENCOUNTER — Encounter: Payer: Self-pay | Admitting: Family Medicine

## 2012-07-29 ENCOUNTER — Ambulatory Visit (INDEPENDENT_AMBULATORY_CARE_PROVIDER_SITE_OTHER): Payer: 59 | Admitting: Family Medicine

## 2012-07-29 VITALS — BP 158/110 | HR 76 | Ht 69.0 in | Wt 173.0 lb

## 2012-07-29 DIAGNOSIS — I10 Essential (primary) hypertension: Secondary | ICD-10-CM

## 2012-07-29 DIAGNOSIS — G43009 Migraine without aura, not intractable, without status migrainosus: Secondary | ICD-10-CM | POA: Insufficient documentation

## 2012-07-29 DIAGNOSIS — G43909 Migraine, unspecified, not intractable, without status migrainosus: Secondary | ICD-10-CM

## 2012-07-29 MED ORDER — PROPRANOLOL HCL ER 80 MG PO CP24: 80.0000 mg | ORAL_CAPSULE | Freq: Every day | ORAL | Status: DC

## 2012-07-29 NOTE — Assessment & Plan Note (Signed)
Assessment: Trigger may be blood pressure, but unknown Plan: start propranolol ER 80 mg daily, f/u HR

## 2012-07-29 NOTE — Assessment & Plan Note (Signed)
Assessment: poorly controlled resistant hypertension Plan: patient to take ACE-I/HCTZ BID and start propranolol ER 80 mg daily

## 2012-07-29 NOTE — Patient Instructions (Signed)
Dear Katelyn [name],   Regarding your blood pressure, we need to do two things today.   1. Take the lisinopril-HCTZ medication twice a day.   2. Start taking propranolol 80 mg once a day. This will help with blood pressure and headaches.   Follow-up in 3-4 weeks.   Sincerely,   Dr. Clinton Sawyer

## 2012-07-29 NOTE — Progress Notes (Signed)
  Subjective:    Patient ID: Katelyn [name], female    DOB: 1975-12-13, 37 y.o.   MRN: [id number]  HPI  37 year old F with HTN and anxiety who presents for follow up. She was seen by Dr. Ashley Royalty for this problem on 07/02/12, who felt that the elevated blood pressures were in part due to stress and white coat hypertension because her 24 hoour ambulatory blood pressure monitor showed an average of 129/85. The patient is concerned that her 24 BP monitoring is not accurate, because she was at home relaxing all day and not at work. Additionally, she denies any new stress.   History of HTN since age 61. Has had normal renin, aldosterone, and adrenal CT. Normal renal function after starting ACE-I.   Family History Mother and 2 brothers both on multiple HTN medications   Hypertension  Home BP monitoring: none  Office BP: BP Readings from Last 3 Encounters:  07/29/12 158/110  07/02/12 165/113  06/21/12 142/98    Prescribed meds: aldoactone, lisinopril-HCTZ, amlodipine   Hypertension ROS:  Taking medications as prescribed:no - only taking lisinopril-HCTZ once daily as opposed to twice daily  Chest pain: No Shortness of breath: No Swelling of extremities: No TIA symptoms: Yes - numbness of one side of left face intermittently, lasts  Other: patient notes severe headaches twice a week that she attributes blood pressure, frequency is twice   HEADACHE: Location: left sided Quality: "severe" Current Frequency: twice weekly Accompanying symptoms: no nausea, no vomiting, yes photophobia, yes phonophobia, no lacrimation, no rhinorrhea, yes worsened with activity, yes neurologic symptoms - left facial numbness Effective treatment: none Ineffective treatment: tylenol History of headaches: yes History of imaging: CT head April 2014 - WNL Known triggers: stress, high BP?     Current Outpatient Prescriptions on File Prior to Visit  Medication Sig Dispense Refill  . amLODipine (NORVASC) 5 MG  tablet Take 1 tablet (5 mg total) by mouth daily.  30 tablet  1  . lisinopril-hydrochlorothiazide (ZESTORETIC) 20-12.5 MG per tablet Take 2 tablets by mouth daily.  60 tablet  3  . ondansetron (ZOFRAN) 4 MG tablet Take 1 tablet (4 mg total) by mouth every 8 (eight) hours as needed for nausea.  20 tablet  0  . sertraline (ZOLOFT) 100 MG tablet Take 1 tablet (100 mg total) by mouth daily.  90 tablet  1  . spironolactone (ALDACTONE) 50 MG tablet Take 1 tablet (50 mg total) by mouth daily.  30 tablet  1  . [DISCONTINUED] citalopram (CELEXA) 10 MG tablet Take 1 tablet (10 mg total) by mouth daily.  30 tablet  2  . [DISCONTINUED] omeprazole (PRILOSEC) 20 MG capsule Take 1 capsule (20 mg total) by mouth daily.  30 capsule  2  . [DISCONTINUED] potassium chloride SA (K-DUR,KLOR-CON) 20 MEQ tablet Take 1 tablet (20 mEq total) by mouth daily. For 7 days  7 tablet  0   No current facility-administered medications on file prior to visit.     Review of Systems See HPI    Objective:   Physical Exam BP 158/110  Pulse 76  Ht 5\' 9"  (1.753 m)  Wt 173 lb (78.472 kg)  BMI 25.54 kg/m2  Gen: well appearing, non distressed, very athletic body habitus CV: RRR, no murmurs, no carotid bruit, no abdominal bruits Neuro: CN II-XII grossly intact Extremities: no edema       Assessment & Plan:

## 2012-08-19 ENCOUNTER — Ambulatory Visit: Payer: 59 | Admitting: Family Medicine

## 2012-09-02 ENCOUNTER — Other Ambulatory Visit: Payer: Self-pay | Admitting: Family Medicine

## 2012-09-02 ENCOUNTER — Encounter: Payer: Self-pay | Admitting: Family Medicine

## 2012-09-02 ENCOUNTER — Ambulatory Visit (INDEPENDENT_AMBULATORY_CARE_PROVIDER_SITE_OTHER): Payer: 59 | Admitting: Family Medicine

## 2012-09-02 VITALS — BP 140/98 | HR 74 | Temp 97.7°F | Ht 69.0 in | Wt 171.0 lb

## 2012-09-02 DIAGNOSIS — F4323 Adjustment disorder with mixed anxiety and depressed mood: Secondary | ICD-10-CM

## 2012-09-02 DIAGNOSIS — G43009 Migraine without aura, not intractable, without status migrainosus: Secondary | ICD-10-CM

## 2012-09-02 MED ORDER — KETOROLAC TROMETHAMINE 60 MG/2ML IM SOLN
60.0000 mg | Freq: Once | INTRAMUSCULAR | Status: AC
  Administered 2012-09-02: 60 mg via INTRAMUSCULAR

## 2012-09-02 MED ORDER — DEXAMETHASONE SODIUM PHOSPHATE 10 MG/ML IJ SOLN
10.0000 mg | Freq: Once | INTRAMUSCULAR | Status: AC
  Administered 2012-09-02: 10 mg via INTRAMUSCULAR

## 2012-09-02 MED ORDER — DIPHENHYDRAMINE HCL 50 MG PO TABS
25.0000 mg | ORAL_TABLET | Freq: Once | ORAL | Status: AC
  Administered 2012-09-02: 25 mg via ORAL

## 2012-09-02 MED ORDER — METOPROLOL TARTRATE 50 MG PO TABS: 50.0000 mg | ORAL_TABLET | Freq: Two times a day (BID) | ORAL | Status: DC

## 2012-09-02 NOTE — Patient Instructions (Addendum)
Nice to meet you.  We have treated your migraine with a steroid shot, an anti-inflammatory shot, and benadryl. Please take the benadryl once you get home. Please schedule a follow-up with Dr. Clinton Sawyer to discuss maintenance treatment for your migraines. I have changed the medication he prescribed to something that should be on the 4 dollar list.

## 2012-09-02 NOTE — Assessment & Plan Note (Addendum)
Will give decadron, toradol, and benadryl for acute therapy. Changed propranolol to metoprolol for prophylaxis. To follow up with Dr. Clinton Sawyer to discuss further prophylactic measures.

## 2012-09-02 NOTE — Progress Notes (Signed)
  Subjective:    Patient ID: Katelyn [name], female    DOB: 1975/04/17, 37 y.o.   MRN: [id number]  Hypertension  Migraine  Her past medical history is significant for hypertension.   Patient is a 37 yo female who presents with acute onset migraine.  Started this morning at 5 am. Frontal and sharp, though started on the left. Intermittent. States vision is blurry (though notes she wears glasses). Usually has slightly blurry vision with migraines. Has had some nausea, though no vomiting. Light and sound bothers her. Tried goody powder. Thinks blood pressure is a trigger. States was elevated this morning and took medications, improved with this. Previously Dr. Clinton Sawyer started on propranolol, though patient could not afford this.  PMH: smokes 1/2 PPD  Review of Systems see HPI     Objective:   Physical Exam  Constitutional: She appears well-developed and well-nourished.  HENT:  Head: Normocephalic and atraumatic.  Mouth/Throat: Oropharynx is clear and moist.  Eyes: Conjunctivae are normal. Pupils are equal, round, and reactive to light.  Neck: Neck supple.  Neurological: She is alert.  CN intact, 5/5 strength throughout, sensation to light touch intact throughout  Skin: Skin is warm and dry.  BP 140/98  Pulse 74  Temp(Src) 97.7 F (36.5 C) (Oral)  Ht 5\' 9"  (1.753 m)  Wt 171 lb (77.565 kg)  BMI 25.24 kg/m2     Assessment & Plan:

## 2012-09-02 NOTE — Addendum Note (Signed)
Addended by: Jone Baseman D on: 09/02/2012 12:25 PM   Modules accepted: Orders

## 2012-09-03 ENCOUNTER — Telehealth: Payer: Self-pay | Admitting: Family Medicine

## 2012-09-03 NOTE — Telephone Encounter (Signed)
Patient is calling to see if the note has been faxed yet.  She is asking that she be called when it has been faxed.

## 2012-09-03 NOTE — Telephone Encounter (Signed)
Patient was seen by Dr. Birdie Sons 8/28 and taken out of work until tomorrow. Patient has returned to work today and would like a new MD note stating she is able to return today. Please fax revised MD note to [phone] ATTN: Arlyss Repress

## 2012-09-03 NOTE — Telephone Encounter (Signed)
Pt called and informed that work note has been faxed Wyatt Haste, RN-BSN

## 2012-09-03 NOTE — Telephone Encounter (Signed)
Letter placed up front to be faxed. Please inform patient it has been faxed.

## 2012-09-03 NOTE — Telephone Encounter (Signed)
Please see note below. [name] Mitchelle Goerner, RN-BSN  

## 2012-10-18 ENCOUNTER — Emergency Department (HOSPITAL_COMMUNITY)
Admission: EM | Admit: 2012-10-18 | Discharge: 2012-10-18 | Disposition: A | Discharge status: Home / Self Care | Payer: Commercial Indemnity | Attending: Emergency Medicine | Admitting: Emergency Medicine

## 2012-10-18 ENCOUNTER — Encounter (HOSPITAL_COMMUNITY): Payer: Self-pay | Admitting: Emergency Medicine

## 2012-10-18 ENCOUNTER — Other Ambulatory Visit: Payer: Self-pay

## 2012-10-18 DIAGNOSIS — I10 Essential (primary) hypertension: Secondary | ICD-10-CM

## 2012-10-18 DIAGNOSIS — R112 Nausea with vomiting, unspecified: Secondary | ICD-10-CM | POA: Insufficient documentation

## 2012-10-18 DIAGNOSIS — R1110 Vomiting, unspecified: Secondary | ICD-10-CM

## 2012-10-18 DIAGNOSIS — Z8659 Personal history of other mental and behavioral disorders: Secondary | ICD-10-CM | POA: Insufficient documentation

## 2012-10-18 DIAGNOSIS — F17200 Nicotine dependence, unspecified, uncomplicated: Secondary | ICD-10-CM | POA: Insufficient documentation

## 2012-10-18 DIAGNOSIS — Z3202 Encounter for pregnancy test, result negative: Secondary | ICD-10-CM | POA: Insufficient documentation

## 2012-10-18 DIAGNOSIS — R630 Anorexia: Secondary | ICD-10-CM | POA: Insufficient documentation

## 2012-10-18 DIAGNOSIS — Z862 Personal history of diseases of the blood and blood-forming organs and certain disorders involving the immune mechanism: Secondary | ICD-10-CM | POA: Insufficient documentation

## 2012-10-18 DIAGNOSIS — R5381 Other malaise: Secondary | ICD-10-CM | POA: Insufficient documentation

## 2012-10-18 DIAGNOSIS — Z79899 Other long term (current) drug therapy: Secondary | ICD-10-CM | POA: Insufficient documentation

## 2012-10-18 DIAGNOSIS — R109 Unspecified abdominal pain: Secondary | ICD-10-CM | POA: Insufficient documentation

## 2012-10-18 LAB — CBC WITH DIFFERENTIAL/PLATELET
Basophils Absolute: 0 10*3/uL (ref 0.0–0.1)
Eosinophils Absolute: 0.5 10*3/uL (ref 0.0–0.7)
Lymphocytes Relative: 15 % (ref 12–46)
Lymphs Abs: 1.9 10*3/uL (ref 0.7–4.0)
MCH: 32.4 pg (ref 26.0–34.0)
MCHC: 37 g/dL — ABNORMAL HIGH (ref 30.0–36.0)
MCV: 87.7 fL (ref 78.0–100.0)
Neutrophils Relative %: 75 % (ref 43–77)
Platelets: 167 10*3/uL (ref 150–400)
RBC: 4.72 MIL/uL (ref 3.87–5.11)
WBC: 12.2 10*3/uL — ABNORMAL HIGH (ref 4.0–10.5)

## 2012-10-18 LAB — COMPREHENSIVE METABOLIC PANEL
ALT: 23 U/L (ref 0–35)
AST: 24 U/L (ref 0–37)
Alkaline Phosphatase: 77 U/L (ref 39–117)
CO2: 23 mEq/L (ref 19–32)
GFR calc Af Amer: >90 mL/min (ref >90)
Glucose, Bld: 82 mg/dL (ref 70–99)
Potassium: 3.5 mEq/L (ref 3.5–5.1)
Sodium: 136 mEq/L (ref 135–145)
Total Bilirubin: 0.3 mg/dL (ref 0.3–1.2)
Total Protein: 7.8 g/dL (ref 6.0–8.3)

## 2012-10-18 LAB — URINALYSIS, ROUTINE W REFLEX MICROSCOPIC
Bilirubin Urine: NEGATIVE
Glucose, UA: NEGATIVE mg/dL
Hgb urine dipstick: NEGATIVE
Ketones, ur: NEGATIVE mg/dL
Specific Gravity, Urine: 1.012 (ref 1.005–1.030)
pH: 5.5 (ref 5.0–8.0)

## 2012-10-18 LAB — URINE MICROSCOPIC-ADD ON

## 2012-10-18 LAB — POCT PREGNANCY, URINE: Preg Test, Ur: NEGATIVE

## 2012-10-18 MED ORDER — ONDANSETRON HCL 4 MG/2ML IJ SOLN
4.0000 mg | Freq: Once | INTRAMUSCULAR | Status: AC
  Administered 2012-10-18: 4 mg via INTRAVENOUS
  Filled 2012-10-18: qty 2

## 2012-10-18 MED ORDER — ONDANSETRON HCL 4 MG PO TABS: 4.0000 mg | ORAL_TABLET | Freq: Four times a day (QID) | ORAL | Status: DC

## 2012-10-18 MED ORDER — LABETALOL HCL 5 MG/ML IV SOLN
20.0000 mg | Freq: Once | INTRAVENOUS | Status: AC
  Administered 2012-10-18: 20 mg via INTRAVENOUS
  Filled 2012-10-18: qty 4

## 2012-10-18 NOTE — ED Notes (Signed)
Pt c/o generalized weakness worse when standing with some N/V x 3 days; pt denies diarrhea

## 2012-10-18 NOTE — ED Provider Notes (Signed)
CSN: [id number]     Arrival date & time 10/18/12  0744 History   First MD Initiated Contact with Patient 10/18/12 0754     Chief Complaint  Patient presents with  . Weakness  . Emesis   (Consider location/radiation/quality/duration/timing/severity/associated sxs/prior Treatment) HPI Comments: 37 yo AA female with cc of n/v x 3 days.  Coworkers with similar symptoms.    Pt with h/o significant HTN on norvasc, zestoretic, lopressor, spironolactone  She denies f/c, HA, cp, sob, cough, abd pain except for cramping with emesis, f/u/d, vaginal bleeding or discharge, suprapubic pain, neurologic deficits.    Patient is a 37 y.o. female presenting with weakness and vomiting. The history is provided by the patient.  Weakness This is a recurrent problem. The current episode started more than 2 days ago. The problem occurs constantly. Pertinent negatives include no chest pain, no headaches and no shortness of breath. Nothing aggravates the symptoms. Nothing relieves the symptoms. She has tried nothing for the symptoms.  Emesis Severity:  Mild Duration:  3 days Timing:  Intermittent Number of daily episodes:  2-3 Quality:  Bilious material Able to tolerate:  Liquids Chronicity:  New Recent urination:  Normal Context: not post-tussive and not self-induced   Relieved by:  None tried Worsened by:  Nothing tried Associated symptoms: no arthralgias, no chills, no cough, no diarrhea, no fever and no headaches   Associated symptoms comment:  Crampy abdominal pain Risk factors: sick contacts   Risk factors: no diabetes, not pregnant now, no suspect food intake and no travel to endemic areas   Risk factors comment:  LMP - years ago (she had an ablation)  Coworkers with similar symptoms   Past Medical History  Diagnosis Date  . Hypertension   . Palpitations   . Chest pain   . Anemia   . Anxiety   . Depression    Past Surgical History  Procedure Laterality Date  . Hysteroscopy w/ endometrial  ablation    . Tubal ligation    . Abdominal hysterectomy     Family History  Problem Relation Age of Onset  . Diabetes Mother   . Depression Mother   . Depression Sister    History  Substance Use Topics  . Smoking status: Current Every Day Smoker -- 0.50 packs/day    Types: Cigarettes    Start date: 01/06/1994  . Smokeless tobacco: Never Used  . Alcohol Use: 2.0 oz/week    4 drink(s) per week     Comment: occasional   OB History   Grav Para Term Preterm Abortions TAB SAB Ect Mult Living                 Review of Systems  Constitutional: Positive for appetite change and fatigue. Negative for fever, chills and unexpected weight change.  HENT: Negative.   Eyes: Negative.   Respiratory: Negative.  Negative for shortness of breath.   Cardiovascular: Negative.  Negative for chest pain.  Gastrointestinal: Positive for nausea and vomiting. Negative for diarrhea, constipation and blood in stool.  Endocrine: Negative.   Genitourinary: Negative.   Musculoskeletal: Negative for arthralgias.  Neurological: Positive for weakness. Negative for headaches.    Allergies  Review of patient's allergies indicates no known allergies.  Home Medications   Current Outpatient Rx  Name  Route  Sig  Dispense  Refill  . amLODipine (NORVASC) 5 MG tablet   Oral   Take 1 tablet (5 mg total) by mouth daily.   30 tablet  1   . lisinopril-hydrochlorothiazide (PRINZIDE,ZESTORETIC) 20-12.5 MG per tablet   Oral   Take 1 tablet by mouth 2 (two) times daily.         . metoprolol (LOPRESSOR) 50 MG tablet   Oral   Take 1 tablet (50 mg total) by mouth 2 (two) times daily.   60 tablet   1    BP 200/135  Pulse 65  Temp(Src) 98.7 F (37.1 C) (Oral)  Resp 15  Ht 5\' 9"  (1.753 m)  Wt 175 lb (79.379 kg)  BMI 25.83 kg/m2  SpO2 98% Physical Exam  Vitals reviewed. Constitutional: She is oriented to person, place, and time. She appears well-developed.  HENT:  Head: Normocephalic and  atraumatic.  Eyes: Conjunctivae and EOM are normal. Pupils are equal, round, and reactive to light. Right eye exhibits no discharge. Left eye exhibits no discharge.  Neck: Normal range of motion. Neck supple. No JVD present.  Cardiovascular: Normal rate and regular rhythm.   Pulmonary/Chest: Effort normal and breath sounds normal. No stridor.  Abdominal: Soft. Bowel sounds are normal. She exhibits no distension and no mass. There is no tenderness. There is no rebound and no guarding.  Musculoskeletal: Normal range of motion. She exhibits no edema and no tenderness.  Neurological: She is alert and oriented to person, place, and time. She has normal reflexes.  Skin: Skin is warm and dry.    ED Course  Procedures (including critical care time) Labs Review Results for orders placed during the hospital encounter of 10/18/12  CBC WITH DIFFERENTIAL      Result Value Range   WBC 12.2 (*) 4.0 - 10.5 K/uL   RBC 4.72  3.87 - 5.11 MIL/uL   Hemoglobin 15.3 (*) 12.0 - 15.0 g/dL   HCT 64.4  03.4 - 74.2 %   MCV 87.7  78.0 - 100.0 fL   MCH 32.4  26.0 - 34.0 pg   MCHC 37.0 (*) 30.0 - 36.0 g/dL   RDW 59.5  63.8 - 75.6 %   Platelets 167  150 - 400 K/uL   Neutrophils Relative % 75  43 - 77 %   Neutro Abs 9.2 (*) 1.7 - 7.7 K/uL   Lymphocytes Relative 15  12 - 46 %   Lymphs Abs 1.9  0.7 - 4.0 K/uL   Monocytes Relative 5  3 - 12 %   Monocytes Absolute 0.7  0.1 - 1.0 K/uL   Eosinophils Relative 4  0 - 5 %   Eosinophils Absolute 0.5  0.0 - 0.7 K/uL   Basophils Relative 0  0 - 1 %   Basophils Absolute 0.0  0.0 - 0.1 K/uL  COMPREHENSIVE METABOLIC PANEL      Result Value Range   Sodium 136  135 - 145 mEq/L   Potassium 3.5  3.5 - 5.1 mEq/L   Chloride 101  96 - 112 mEq/L   CO2 23  19 - 32 mEq/L   Glucose, Bld 82  70 - 99 mg/dL   BUN 11  6 - 23 mg/dL   Creatinine, Ser 4.33  0.50 - 1.10 mg/dL   Calcium 9.2  8.4 - 29.5 mg/dL   Total Protein 7.8  6.0 - 8.3 g/dL   Albumin 4.2  3.5 - 5.2 g/dL   AST 24  0  - 37 U/L   ALT 23  0 - 35 U/L   Alkaline Phosphatase 77  39 - 117 U/L   Total Bilirubin 0.3  0.3 - 1.2 mg/dL  GFR calc non Af Amer >90  >90 mL/min   GFR calc Af Amer >90  >90 mL/min  LIPASE, BLOOD      Result Value Range   Lipase 29  11 - 59 U/L  URINALYSIS, ROUTINE W REFLEX MICROSCOPIC      Result Value Range   Color, Urine YELLOW  YELLOW   APPearance CLOUDY (*) CLEAR   Specific Gravity, Urine 1.012  1.005 - 1.030   pH 5.5  5.0 - 8.0   Glucose, UA NEGATIVE  NEGATIVE mg/dL   Hgb urine dipstick NEGATIVE  NEGATIVE   Bilirubin Urine NEGATIVE  NEGATIVE   Ketones, ur NEGATIVE  NEGATIVE mg/dL   Protein, ur 30 (*) NEGATIVE mg/dL   Urobilinogen, UA 0.2  0.0 - 1.0 mg/dL   Nitrite NEGATIVE  NEGATIVE   Leukocytes, UA NEGATIVE  NEGATIVE  URINE MICROSCOPIC-ADD ON      Result Value Range   Squamous Epithelial / LPF FEW (*) RARE   WBC, UA 0-2  <3 WBC/hpf   RBC / HPF 0-2  <3 RBC/hpf   Bacteria, UA RARE  RARE  POCT PREGNANCY, URINE      Result Value Range   Preg Test, Ur NEGATIVE  NEGATIVE    Date: 10/18/2012  Rate: 65  Rhythm: normal sinus rhythm  QRS Axis: normal  Intervals: PR prolonged  ST/T Wave abnormalities: normal  Conduction Disutrbances:none  Narrative Interpretation: Sinus rhythm with prolonged PR interval, EKG consistent with LVH  Old EKG Reviewed: unchanged   MDM   1. Hypertension, poor control   2. Vomiting    37 year old female presents emergency department with chief complaint of nausea and vomiting x3 days. Patient denies fever, chills, abdominal pain other than mild cramping with emesis, frequency urgency dysuria. Of note the patient denies chest pain, cough, shortness of breath but she does report mild weakness.    ER course: Blood pressure on arrival was significantly elevated at 230/150 and the patient was given labetalol 20 mg IV. Screening EKG was obtained which was without signs of ischemic changes. Patient denies chest pain and she is not a diabetic. Blood  pressure improved with labetalol. Patient's abdomen is soft and nontender. Patient denies pelvic symptoms or lower abdominal pain therefore pelvic exam was not performed. She was given Zofran for symptoms of nausea.  9:25 AM  VS improved.  BP 194/122.  Pt is symptom free.  No evidence of endorgan damage due to significant hypertension. Renal function normal, UA without blood, patient has no headache or neurologic deficit, EKG without ischemic changes and patient is without chest pain.   Upon further questioning, patient admits occasional noncompliance with her blood pressure medications. I attempted to enforced the necessity the patient to take her blood pressure medications as prescribed.  As for vomiting; pt has sick contacts at work with similar symptoms.  She is AF and labs are neg except for mild elevation in WBC.  Benign abd.  Doubt SBI, surgical abd, pancreatitis, or pelvic source.  Doubt sbi, appy, surgical abd, pancreatitis, chole, or emergent etiology.  F/U PCM.  ER precautions given. Pt agrees with plan.       Darlys Gales, MD 10/18/12 2100

## 2012-10-18 NOTE — ED Notes (Signed)
Katelyn [name] advised of patients elevated blood pressure.  i then informed cindy of the same.

## 2012-10-21 ENCOUNTER — Encounter: Payer: Self-pay | Admitting: Family Medicine

## 2012-10-21 ENCOUNTER — Ambulatory Visit (INDEPENDENT_AMBULATORY_CARE_PROVIDER_SITE_OTHER): Payer: Commercial Indemnity | Admitting: Family Medicine

## 2012-10-21 VITALS — BP 134/86 | HR 80 | Ht 69.0 in | Wt 170.0 lb

## 2012-10-21 DIAGNOSIS — G43009 Migraine without aura, not intractable, without status migrainosus: Secondary | ICD-10-CM

## 2012-10-21 MED ORDER — DEXAMETHASONE SODIUM PHOSPHATE 10 MG/ML IJ SOLN
10.0000 mg | Freq: Once | INTRAMUSCULAR | Status: AC
  Administered 2012-10-21: 10 mg via INTRAMUSCULAR

## 2012-10-21 MED ORDER — KETOROLAC TROMETHAMINE 30 MG/ML IJ SOLN
30.0000 mg | Freq: Once | INTRAMUSCULAR | Status: AC
  Administered 2012-10-21: 30 mg via INTRAMUSCULAR

## 2012-10-21 MED ORDER — DIPHENHYDRAMINE HCL 50 MG/ML IJ SOLN
50.0000 mg | Freq: Once | INTRAMUSCULAR | Status: AC
  Administered 2012-10-21: 50 mg via INTRAMUSCULAR

## 2012-10-21 MED ORDER — SUMATRIPTAN SUCCINATE 25 MG PO TABS: 25.0000 mg | ORAL_TABLET | ORAL | Status: DC | PRN

## 2012-10-21 NOTE — Assessment & Plan Note (Signed)
Assessment: severe primary headache, no red flags for secondary cause Plan: given IM decadron 10 mg, benadryl 25 mg, and toradol 30 mg now; give imitrex to take at home PRN; f/u in 2 weeks

## 2012-10-21 NOTE — Progress Notes (Signed)
  Subjective:    Patient ID: Marena Chancy, female    DOB: 1975/10/23, 37 y.o.   MRN: [id number]  HPI  37 year old F who presents with a headache.   HEADACHE: Location: frontal Quality: pounding Duration of headachet: 8 hours Accompanying symptoms: yes nausea, no vomiting, yes photophobia, no phonophobia, no lacrimation, no rhinorrhea, no worsened with activity, no neurologic symptoms Effective treatment: none (on metoprolol for prophylaxis - last severe headache 6 weeks ago) Ineffective treatment: ibuprofen History of headaches: yes History of imaging: CT April 2014, WNL Known triggers: no, possibly caffeine since did not have it this morning History of headache journal: no     Review of Systems     Objective:   Physical Exam BP 134/86  Pulse 80  Ht 5\' 9"  (1.753 m)  Wt 170 lb (77.111 kg)  BMI 25.09 kg/m2  WUJ:WJXBJY age 37 female, mild distress but non ill HEENT: NCAT, PERRLA, EOMI, OP clear and moist, optic disc difficult to visualize, no venous pulsations CV: RRR, no m/r/g, no JVD or carotid bruits Pulm: normal WOB, CTA-B Neuro/Psych: A&Ox4, normal affect, speech, and thought content; CN II-XII grossly intact       Assessment & Plan:

## 2012-10-21 NOTE — Patient Instructions (Signed)
Dear Ms. Morehead,   Thank you for coming to clinic today. Please read below regarding the issues that we discussed.   1. Headache - I don't see any concerning cause of the headache and think it may be related to not having coffee this morning. You should feel better at the injections today. If the headache gets worse then please come back for a follow up. In the future, for a severe headache I would use Excedrin Migraine. I will also give you a prescription for a pill called imitrex, which is only for very severe headaches.   2. High Blood Pressure - You blood pressure is great today. Please continue with your regular meds.   Please follow up in clinic in 2 weeks. Please call earlier if you have any questions or concerns.   Sincerely,   Dr. Clinton Sawyer

## 2012-10-28 ENCOUNTER — Ambulatory Visit: Payer: Commercial Indemnity | Admitting: Family Medicine

## 2012-12-29 ENCOUNTER — Other Ambulatory Visit: Payer: Self-pay | Admitting: Family Medicine

## 2012-12-29 DIAGNOSIS — I10 Essential (primary) hypertension: Secondary | ICD-10-CM

## 2012-12-29 MED ORDER — AMLODIPINE BESYLATE 5 MG PO TABS: 5.0000 mg | ORAL_TABLET | Freq: Every day | ORAL | Status: DC

## 2013-03-01 ENCOUNTER — Encounter (HOSPITAL_COMMUNITY): Payer: Self-pay | Admitting: Emergency Medicine

## 2013-03-01 ENCOUNTER — Emergency Department (HOSPITAL_COMMUNITY): Payer: Commercial Indemnity

## 2013-03-01 ENCOUNTER — Emergency Department (HOSPITAL_COMMUNITY)
Admission: EM | Admit: 2013-03-01 | Discharge: 2013-03-01 | Disposition: A | Discharge status: Home / Self Care | Payer: Commercial Indemnity | Attending: Emergency Medicine | Admitting: Emergency Medicine

## 2013-03-01 DIAGNOSIS — F17200 Nicotine dependence, unspecified, uncomplicated: Secondary | ICD-10-CM | POA: Insufficient documentation

## 2013-03-01 DIAGNOSIS — R42 Dizziness and giddiness: Secondary | ICD-10-CM | POA: Insufficient documentation

## 2013-03-01 DIAGNOSIS — F411 Generalized anxiety disorder: Secondary | ICD-10-CM | POA: Insufficient documentation

## 2013-03-01 DIAGNOSIS — R1110 Vomiting, unspecified: Secondary | ICD-10-CM | POA: Insufficient documentation

## 2013-03-01 DIAGNOSIS — R519 Headache, unspecified: Secondary | ICD-10-CM

## 2013-03-01 DIAGNOSIS — H53149 Visual discomfort, unspecified: Secondary | ICD-10-CM | POA: Insufficient documentation

## 2013-03-01 DIAGNOSIS — R51 Headache: Secondary | ICD-10-CM | POA: Insufficient documentation

## 2013-03-01 DIAGNOSIS — R079 Chest pain, unspecified: Secondary | ICD-10-CM | POA: Insufficient documentation

## 2013-03-01 DIAGNOSIS — F329 Major depressive disorder, single episode, unspecified: Secondary | ICD-10-CM | POA: Insufficient documentation

## 2013-03-01 DIAGNOSIS — F3289 Other specified depressive episodes: Secondary | ICD-10-CM | POA: Insufficient documentation

## 2013-03-01 DIAGNOSIS — Z862 Personal history of diseases of the blood and blood-forming organs and certain disorders involving the immune mechanism: Secondary | ICD-10-CM | POA: Insufficient documentation

## 2013-03-01 DIAGNOSIS — I10 Essential (primary) hypertension: Secondary | ICD-10-CM | POA: Insufficient documentation

## 2013-03-01 DIAGNOSIS — Z79899 Other long term (current) drug therapy: Secondary | ICD-10-CM | POA: Insufficient documentation

## 2013-03-01 LAB — BASIC METABOLIC PANEL
BUN: 10 mg/dL (ref 6–23)
CALCIUM: 9 mg/dL (ref 8.4–10.5)
CO2: 26 meq/L (ref 19–32)
CREATININE: 0.74 mg/dL (ref 0.50–1.10)
Chloride: 103 mEq/L (ref 96–112)
GFR calc Af Amer: >90 mL/min (ref >90)
GFR calc non Af Amer: >90 mL/min (ref >90)
Glucose, Bld: 86 mg/dL (ref 70–99)
Potassium: 3.6 mEq/L — ABNORMAL LOW (ref 3.7–5.3)
SODIUM: 141 meq/L (ref 137–147)

## 2013-03-01 LAB — CBC
HEMATOCRIT: 40.5 % (ref 36.0–46.0)
HEMOGLOBIN: 15 g/dL (ref 12.0–15.0)
MCH: 33 pg (ref 26.0–34.0)
MCHC: 37 g/dL — ABNORMAL HIGH (ref 30.0–36.0)
MCV: 89.2 fL (ref 78.0–100.0)
Platelets: 156 10*3/uL (ref 150–400)
RBC: 4.54 MIL/uL (ref 3.87–5.11)
RDW: 12.1 % (ref 11.5–15.5)
WBC: 8.6 10*3/uL (ref 4.0–10.5)

## 2013-03-01 LAB — I-STAT TROPONIN, ED
TROPONIN I, POC: 0.01 ng/mL (ref 0.00–0.08)
Troponin i, poc: 0 ng/mL (ref 0.00–0.08)

## 2013-03-01 MED ORDER — DIPHENHYDRAMINE HCL 50 MG/ML IJ SOLN
25.0000 mg | Freq: Once | INTRAMUSCULAR | Status: AC
  Administered 2013-03-01: 25 mg via INTRAVENOUS
  Filled 2013-03-01: qty 1

## 2013-03-01 MED ORDER — KETOROLAC TROMETHAMINE 30 MG/ML IJ SOLN
30.0000 mg | Freq: Once | INTRAMUSCULAR | Status: AC
  Administered 2013-03-01: 30 mg via INTRAVENOUS
  Filled 2013-03-01: qty 1

## 2013-03-01 MED ORDER — PROCHLORPERAZINE EDISYLATE 5 MG/ML IJ SOLN
10.0000 mg | Freq: Four times a day (QID) | INTRAMUSCULAR | Status: DC | PRN
  Administered 2013-03-01: 10 mg via INTRAVENOUS

## 2013-03-01 MED ORDER — SODIUM CHLORIDE 0.9 % IV BOLUS (SEPSIS)
1000.0000 mL | Freq: Once | INTRAVENOUS | Status: AC
  Administered 2013-03-01: 1000 mL via INTRAVENOUS

## 2013-03-01 NOTE — ED Notes (Signed)
Pt had headache all day yesterday and was vomiting.  Pt reports started getting chest pain while at store and felt like she was going to pass out.  Pt has pain to left chest and reports numbness to left face and left arm.   Pt is on several medications for blood pressure.

## 2013-03-01 NOTE — Discharge Instructions (Signed)
Arterial Hypertension Arterial hypertension (high blood pressure) is a condition of elevated pressure in your blood vessels. Hypertension over a long period of time is a risk factor for strokes, heart attacks, and heart failure. It is also the leading cause of kidney (renal) failure.  CAUSES   In Adults -- Over 90% of all hypertension has no known cause. This is called essential or primary hypertension. In the other 10% of people with hypertension, the increase in blood pressure is caused by another disorder. This is called secondary hypertension. Important causes of secondary hypertension are:  Heavy alcohol use.  Obstructive sleep apnea.  Hyperaldosterosim (Conn's syndrome).  Steroid use.  Chronic kidney failure.  Hyperparathyroidism.  Medications.  Renal artery stenosis.  Pheochromocytoma.  Cushing's disease.  Coarctation of the aorta.  Scleroderma renal crisis.  Licorice (in excessive amounts).  Drugs (cocaine, methamphetamine). Your caregiver can explain any items above that apply to you.  In Children -- Secondary hypertension is more common and should always be considered.  Pregnancy -- Few women of childbearing age have high blood pressure. However, up to 10% of them develop hypertension of pregnancy. Generally, this will not harm the woman. It may be a sign of 3 complications of pregnancy: preeclampsia, HELLP syndrome, and eclampsia. Follow up and control with medication is necessary. SYMPTOMS   This condition normally does not produce any noticeable symptoms. It is usually found during a routine exam.  Malignant hypertension is a late problem of high blood pressure. It may have the following symptoms:  Headaches.  Blurred vision.  End-organ damage (this means your kidneys, heart, lungs, and other organs are being damaged).  Stressful situations can increase the blood pressure. If a person with normal blood pressure has their blood pressure go up while  being seen by their caregiver, this is often termed "white coat hypertension." Its importance is not known. It may be related with eventually developing hypertension or complications of hypertension.  Hypertension is often confused with mental tension, stress, and anxiety. DIAGNOSIS  The diagnosis is made by 3 separate blood pressure measurements. They are taken at least 1 week apart from each other. If there is organ damage from hypertension, the diagnosis may be made without repeat measurements. Hypertension is usually identified by having blood pressure readings:  Above 140/90 mmHg measured in both arms, at 3 separate times, over a couple weeks.  Over 130/80 mmHg should be considered a risk factor and may require treatment in patients with diabetes. Blood pressure readings over 120/80 mmHg are called "pre-hypertension" even in non-diabetic patients. To get a true blood pressure measurement, use the following guidelines. Be aware of the factors that can alter blood pressure readings.  Take measurements at least 1 hour after caffeine.  Take measurements 30 minutes after smoking and without any stress. This is another reason to quit smoking  it raises your blood pressure.  Use a proper cuff size. Ask your caregiver if you are not sure about your cuff size.  Most home blood pressure cuffs are automatic. They will measure systolic and diastolic pressures. The systolic pressure is the pressure reading at the start of sounds. Diastolic pressure is the pressure at which the sounds disappear. If you are elderly, measure pressures in multiple postures. Try sitting, lying or standing.  Sit at rest for a minimum of 5 minutes before taking measurements.  You should not be on any medications like decongestants. These are found in many cold medications.  Record your blood pressure readings and review  them with your caregiver. If you have hypertension:  Your caregiver may do tests to be sure you do  not have secondary hypertension (see "causes" above).  Your caregiver may also look for signs of metabolic syndrome. This is also called Syndrome X or Insulin Resistance Syndrome. You may have this syndrome if you have type 2 diabetes, abdominal obesity, and abnormal blood lipids in addition to hypertension.  Your caregiver will take your medical and family history and perform a physical exam.  Diagnostic tests may include blood tests (for glucose, cholesterol, potassium, and kidney function), a urinalysis, or an EKG. Other tests may also be necessary depending on your condition. PREVENTION  There are important lifestyle issues that you can adopt to reduce your chance of developing hypertension:  Maintain a normal weight.  Limit the amount of salt (sodium) in your diet.  Exercise often.  Limit alcohol intake.  Get enough potassium in your diet. Discuss specific advice with your caregiver.  Follow a DASH diet (dietary approaches to stop hypertension). This diet is rich in fruits, vegetables, and low-fat dairy products, and avoids certain fats. PROGNOSIS  Essential hypertension cannot be cured. Lifestyle changes and medical treatment can lower blood pressure and reduce complications. The prognosis of secondary hypertension depends on the underlying cause. Many people whose hypertension is controlled with medicine or lifestyle changes can live a normal, healthy life.  RISKS AND COMPLICATIONS  While high blood pressure alone is not an illness, it often requires treatment due to its short- and long-term effects on many organs. Hypertension increases your risk for:  CVAs or strokes (cerebrovascular accident).  Heart failure due to chronically high blood pressure (hypertensive cardiomyopathy).  Heart attack (myocardial infarction).  Damage to the retina (hypertensive retinopathy).  Kidney failure (hypertensive nephropathy). Your caregiver can explain list items above that apply to you.  Treatment of hypertension can significantly reduce the risk of complications. TREATMENT   For overweight patients, weight loss and regular exercise are recommended. Physical fitness lowers blood pressure.  Mild hypertension is usually treated with diet and exercise. A diet rich in fruits and vegetables, fat-free dairy products, and foods low in fat and salt (sodium) can help lower blood pressure. Decreasing salt intake decreases blood pressure in a 1/3 of people.  Stop smoking if you are a smoker. The steps above are highly effective in reducing blood pressure. While these actions are easy to suggest, they are difficult to achieve. Most patients with moderate or severe hypertension end up requiring medications to bring their blood pressure down to a normal level. There are several classes of medications for treatment. Blood pressure pills (antihypertensives) will lower blood pressure by their different actions. Lowering the blood pressure by 10 mmHg may decrease the risk of complications by as much as 25%. The goal of treatment is effective blood pressure control. This will reduce your risk for complications. Your caregiver will help you determine the best treatment for you according to your lifestyle. What is excellent treatment for one person, may not be for you. HOME CARE INSTRUCTIONS   Do not smoke.  Follow the lifestyle changes outlined in the "Prevention" section.  If you are on medications, follow the directions carefully. Blood pressure medications must be taken as prescribed. Skipping doses reduces their benefit. It also puts you at risk for problems.  Follow up with your caregiver, as directed.  If you are asked to monitor your blood pressure at home, follow the guidelines in the "Diagnosis" section above. [place]  IF:   You think you are having medication side effects.  You have recurrent headaches or lightheadedness.  You have swelling in your ankles.  You have  trouble with your vision. SEEK IMMEDIATE MEDICAL CARE IF:   You have sudden onset of chest pain or pressure, difficulty breathing, or other symptoms of a heart attack.  You have a severe headache.  You have symptoms of a stroke (such as sudden weakness, difficulty speaking, difficulty walking). MAKE SURE YOU:   Understand these instructions.  Will watch your condition.  Will get help right away if you are not doing well or get worse. Document Released: 12/23/2004 Document Revised: 03/17/2011 Document Reviewed: 07/23/2006 [facility] Patient Information 2014 [place]. Chest Pain (Nonspecific) It is often hard to give a specific diagnosis for the cause of chest pain. There is always a chance that your pain could be related to something serious, such as a heart attack or a blood clot in the lungs. You need to follow up with your caregiver for further evaluation. CAUSES   Heartburn.  Pneumonia or bronchitis.  Anxiety or stress.  Inflammation around your heart (pericarditis) or lung (pleuritis or pleurisy).  A blood clot in the lung.  A collapsed lung (pneumothorax). It can develop suddenly on its own (spontaneous pneumothorax) or from injury (trauma) to the chest.  Shingles infection (herpes zoster virus). The chest wall is composed of bones, muscles, and cartilage. Any of these can be the source of the pain.  The bones can be bruised by injury.  The muscles or cartilage can be strained by coughing or overwork.  The cartilage can be affected by inflammation and become sore (costochondritis). DIAGNOSIS  Lab tests or other studies, such as X-rays, electrocardiography, stress testing, or cardiac imaging, may be needed to find the cause of your pain.  TREATMENT   Treatment depends on what may be causing your chest pain. Treatment may include:  Acid blockers for heartburn.  Anti-inflammatory medicine.  Pain medicine for inflammatory conditions.  Antibiotics if an  infection is present.  You may be advised to change lifestyle habits. This includes stopping smoking and avoiding alcohol, caffeine, and chocolate.  You may be advised to keep your head raised (elevated) when sleeping. This reduces the chance of acid going backward from your stomach into your esophagus.  Most of the time, nonspecific chest pain will improve within 2 to 3 days with rest and mild pain medicine. HOME CARE INSTRUCTIONS   If antibiotics were prescribed, take your antibiotics as directed. Finish them even if you start to feel better.  For the next few days, avoid physical activities that bring on chest pain. Continue physical activities as directed.  Do not smoke.  Avoid drinking alcohol.  Only take over-the-counter or prescription medicine for pain, discomfort, or fever as directed by your caregiver.  Follow your caregiver's suggestions for further testing if your chest pain does not go away.  Keep any follow-up appointments you made. If you do not go to an appointment, you could develop lasting (chronic) problems with pain. If there is any problem keeping an appointment, you must call to reschedule. SEEK MEDICAL CARE IF:   You think you are having problems from the medicine you are taking. Read your medicine instructions carefully.  Your chest pain does not go away, even after treatment.  You develop a rash with blisters on your chest. SEEK IMMEDIATE MEDICAL CARE IF:   You have increased chest pain or pain that spreads to your arm,  neck, jaw, back, or abdomen.  You develop shortness of breath, an increasing cough, or you are coughing up blood.  You have severe back or abdominal pain, feel nauseous, or vomit.  You develop severe weakness, fainting, or chills.  You have a fever. THIS IS AN EMERGENCY. Do not wait to see if the pain will go away. Get medical help at once. Call your local emergency services (911 in [place] Do not drive yourself to the hospital. MAKE  SURE YOU:   Understand these instructions.  Will watch your condition.  Will get help right away if you are not doing well or get worse. Document Released: 10/02/2004 Document Revised: 03/17/2011 Document Reviewed: 07/29/2007 [facility] Patient Information 2014 [place]. Migraine Headache A migraine headache is an intense, throbbing pain on one or both sides of your head. A migraine can last for 30 minutes to several hours. CAUSES  The exact cause of a migraine headache is not always known. However, a migraine may be caused when nerves in the brain become irritated and release chemicals that cause inflammation. This causes pain. Certain things may also trigger migraines, such as:  Alcohol.  Smoking.  Stress.  Menstruation.  Aged cheeses.  Foods or drinks that contain nitrates, glutamate, aspartame, or tyramine.  Lack of sleep.  Chocolate.  Caffeine.  Hunger.  Physical exertion.  Fatigue.  Medicines used to treat chest pain (nitroglycerine), birth control pills, estrogen, and some blood pressure medicines. SIGNS AND SYMPTOMS  Pain on one or both sides of your head.  Pulsating or throbbing pain.  Severe pain that prevents daily activities.  Pain that is aggravated by any physical activity.  Nausea, vomiting, or both.  Dizziness.  Pain with exposure to bright lights, loud noises, or activity.  General sensitivity to bright lights, loud noises, or smells. Before you get a migraine, you may get warning signs that a migraine is coming (aura). An aura may include:  Seeing flashing lights.  Seeing bright spots, halos, or zig-zag lines.  Having tunnel vision or blurred vision.  Having feelings of numbness or tingling.  Having trouble talking.  Having muscle weakness. DIAGNOSIS  A migraine headache is often diagnosed based on:  Symptoms.  Physical exam.  A CT scan or MRI of your head. These imaging tests cannot diagnose migraines, but they can  help rule out other causes of headaches. TREATMENT Medicines may be given for pain and nausea. Medicines can also be given to help prevent recurrent migraines.  HOME CARE INSTRUCTIONS  Only take over-the-counter or prescription medicines for pain or discomfort as directed by your health care provider. The use of long-term narcotics is not recommended.  Lie down in a dark, quiet room when you have a migraine.  Keep a journal to find out what may trigger your migraine headaches. For example, write down:  What you eat and drink.  How much sleep you get.  Any change to your diet or medicines.  Limit alcohol consumption.  Quit smoking if you smoke.  Get 7 9 hours of sleep, or as recommended by your health care provider.  Limit stress.  Keep lights dim if bright lights bother you and make your migraines worse. SEEK IMMEDIATE MEDICAL CARE IF:   Your migraine becomes severe.  You have a fever.  You have a stiff neck.  You have vision loss.  You have muscular weakness or loss of muscle control.  You start losing your balance or have trouble walking.  You feel faint or pass  out.  You have severe symptoms that are different from your first symptoms. MAKE SURE YOU:   Understand these instructions.  Will watch your condition.  Will get help right away if you are not doing well or get worse. Document Released: 12/23/2004 Document Revised: 10/13/2012 Document Reviewed: 08/30/2012 [facility] Patient Information 2014 [place].

## 2013-03-01 NOTE — ED Provider Notes (Signed)
CSN: [id number]     Arrival date & time 03/01/13  1602 History   First MD Initiated Contact with Patient 03/01/13 1645     Chief Complaint  Patient presents with  . Chest Pain  . Headache     (Consider location/radiation/quality/duration/timing/severity/associated sxs/prior Treatment) HPI  This a 38 year old female who presents with headache and vomiting. She also reports chest pain. Onset of symptoms was yesterday.  She reports bitemporal headache. She has a history of migraines and this feels similar.  Patient is notably hypertensive. States that she's taken all of her blood pressure medications except metoprolol. Patient states that yesterday she did not feel well all day. She had several episodes of nonbilious, nonbloody emesis. She also states that she had chest pain that was sharp and nonradiating. It lasted for seconds. The [place] just prior to presentation. She states that she felt like she was given a pass out. She did not have an episode of syncope. Patient denies any weakness, numbness, tingling. She denies any vision changes but does endorse photophobia.  Past Medical History  Diagnosis Date  . Hypertension   . Palpitations   . Chest pain   . Anemia   . Anxiety   . Depression    Past Surgical History  Procedure Laterality Date  . Hysteroscopy w/ endometrial ablation    . Tubal ligation    . Abdominal hysterectomy     Family History  Problem Relation Age of Onset  . Diabetes Mother   . Depression Mother   . Depression Sister    History  Substance Use Topics  . Smoking status: Current Every Day Smoker -- 0.50 packs/day    Types: Cigarettes    Start date: 01/06/1994  . Smokeless tobacco: Never Used  . Alcohol Use: 2.0 oz/week    4 drink(s) per week     Comment: occasional   OB History   Grav Para Term Preterm Abortions TAB SAB Ect Mult Living                 Review of Systems  Constitutional: Negative for fever.  Eyes: Positive for photophobia. Negative for  visual disturbance.  Respiratory: Positive for chest tightness. Negative for cough and shortness of breath.   Cardiovascular: Negative for chest pain.  Gastrointestinal: Negative for nausea, vomiting and abdominal pain.  Genitourinary: Negative for dysuria.  Musculoskeletal: Negative for back pain, neck pain and neck stiffness.  Skin: Negative for wound.  Neurological: Positive for light-headedness and headaches. Negative for dizziness, syncope, weakness and numbness.  Psychiatric/Behavioral: Negative for confusion.  All other systems reviewed and are negative.      Allergies  Metoprolol  Home Medications   Current Outpatient Rx  Name  Route  Sig  Dispense  Refill  . amLODipine (NORVASC) 5 MG tablet   Oral   Take 1 tablet (5 mg total) by mouth daily.   30 tablet   2     Pt needs appointment before next refill 12/29/12   . ibuprofen (ADVIL,MOTRIN) 200 MG tablet   Oral   Take 400 mg by mouth every 6 (six) hours as needed for moderate pain.         Marland Kitchen lisinopril-hydrochlorothiazide (PRINZIDE,ZESTORETIC) 20-12.5 MG per tablet   Oral   Take 1 tablet by mouth 2 (two) times daily.         Marland Kitchen Pheniramine-PE-APAP (THERAFLU COLD & SORE THROAT) 20-10-325 MG PACK   Oral   Take 1 Package by mouth daily as needed (for  cold).         . sertraline (ZOLOFT) 100 MG tablet   Oral   Take 100 mg by mouth daily.         Marland Kitchen spironolactone (ALDACTONE) 25 MG tablet   Oral   Take 25 mg by mouth daily.          BP 195/123  Pulse 64  Temp(Src) 98 F (36.7 C) (Oral)  Resp 19  Wt 170 lb (77.111 kg)  SpO2 100% Physical Exam  Nursing note and vitals reviewed. Constitutional: She is oriented to person, place, and time. She appears well-developed and well-nourished. No distress.  HENT:  Head: Normocephalic and atraumatic.  Mouth/Throat: Oropharynx is clear and moist.  Eyes: Conjunctivae are normal. Pupils are equal, round, and reactive to light.  Neck: Neck supple.   Cardiovascular: Normal rate, regular rhythm and normal heart sounds.   No murmur heard. Pulmonary/Chest: Effort normal and breath sounds normal. No respiratory distress. She has no wheezes.  Abdominal: Soft. Bowel sounds are normal. There is no tenderness.  Musculoskeletal: She exhibits no edema.  Lymphadenopathy:    She has no cervical adenopathy.  Neurological: She is alert and oriented to person, place, and time.  Cranial nerves II through XII intact, no dysmetria to finger-nose-finger, 5 out of 5 strength in all 4 extremities  Skin: Skin is warm and dry.  Psychiatric: She has a normal mood and affect.    ED Course  Procedures (including critical care time) Labs Review Labs Reviewed  CBC - Abnormal; Notable for the following:    MCHC 37.0 (*)    All other components within normal limits  BASIC METABOLIC PANEL - Abnormal; Notable for the following:    Potassium 3.6 (*)    All other components within normal limits  I-STAT TROPOININ, ED  I-STAT TROPOININ, ED   Imaging Review Ct Head Wo Contrast  03/01/2013   CLINICAL DATA:  Headache and hypertension.  EXAM: CT HEAD WITHOUT CONTRAST  TECHNIQUE: Contiguous axial images were obtained from the base of the skull through the vertex without intravenous contrast.  COMPARISON:  CT HEAD W/O CM dated 04/12/2012  FINDINGS: Sinuses/Soft tissues: Clear paranasal sinuses and mastoid air cells.  Intracranial: No mass lesion, hemorrhage, hydrocephalus, acute infarct, intra-axial, or extra-axial fluid collection.  IMPRESSION: Normal head CT.   Electronically Signed   By: Abigail Miyamoto M.D.   On: 03/01/2013 18:37    EKG Interpretation    Date/Time:  Tuesday March 01 2013 16:08:33 EST Ventricular Rate:  76 PR Interval:    QRS Duration: 96 QT Interval:  400 QTC Calculation: 450 R Axis:   -33 Text Interpretation:  Normal sinus rhythm Left axis deviation Abnormal ECG No significant change since last tracing Confirmed by Aleia Larocca  MD, Loma Sousa  ([id number]) on 03/01/2013 4:49:35 PM            MDM   Final diagnoses:  Headache  Chest pain  Hypertension   Patient presents with headache and chest pain. She is nontoxic and nonfocal on exam. Initial blood pressures to 220/150. She is not taking her metoprolol as instructed.  Patient's chest pain is somewhat atypical. Delta troponin is negative. EKG and chest x-ray reassuring.  Headache resolved with headache cocktail. Last blood pressure 846 systolic. Patient remained nonfocal. Patient is to followup with her primary care physician for management of her blood pressure. She will be given cardiology followup  for evaluation for chest pain.  After history, exam, and medical workup I feel  the patient has been appropriately medically screened and is safe for discharge home. Pertinent diagnoses were discussed with the patient. Patient was given return precautions.    Merryl Hacker, MD 03/01/13 2023

## 2013-03-01 NOTE — ED Notes (Signed)
Patient transported to CT 

## 2013-06-05 ENCOUNTER — Emergency Department (HOSPITAL_COMMUNITY)
Admission: EM | Admit: 2013-06-05 | Discharge: 2013-06-05 | Disposition: A | Discharge status: Home / Self Care | Payer: Commercial Indemnity | Attending: Emergency Medicine | Admitting: Emergency Medicine

## 2013-06-05 ENCOUNTER — Encounter (HOSPITAL_COMMUNITY): Payer: Self-pay | Admitting: Emergency Medicine

## 2013-06-05 DIAGNOSIS — F411 Generalized anxiety disorder: Secondary | ICD-10-CM | POA: Insufficient documentation

## 2013-06-05 DIAGNOSIS — R51 Headache: Secondary | ICD-10-CM

## 2013-06-05 DIAGNOSIS — G43909 Migraine, unspecified, not intractable, without status migrainosus: Secondary | ICD-10-CM | POA: Insufficient documentation

## 2013-06-05 DIAGNOSIS — R519 Headache, unspecified: Secondary | ICD-10-CM

## 2013-06-05 DIAGNOSIS — F3289 Other specified depressive episodes: Secondary | ICD-10-CM | POA: Insufficient documentation

## 2013-06-05 DIAGNOSIS — Z79899 Other long term (current) drug therapy: Secondary | ICD-10-CM | POA: Insufficient documentation

## 2013-06-05 DIAGNOSIS — F17200 Nicotine dependence, unspecified, uncomplicated: Secondary | ICD-10-CM | POA: Insufficient documentation

## 2013-06-05 DIAGNOSIS — F329 Major depressive disorder, single episode, unspecified: Secondary | ICD-10-CM | POA: Insufficient documentation

## 2013-06-05 DIAGNOSIS — Z862 Personal history of diseases of the blood and blood-forming organs and certain disorders involving the immune mechanism: Secondary | ICD-10-CM | POA: Insufficient documentation

## 2013-06-05 DIAGNOSIS — I10 Essential (primary) hypertension: Secondary | ICD-10-CM | POA: Insufficient documentation

## 2013-06-05 HISTORY — DX: Migraine, unspecified, not intractable, without status migrainosus: G43.909

## 2013-06-05 MED ORDER — LISINOPRIL-HYDROCHLOROTHIAZIDE 20-12.5 MG PO TABS: 1.0000 | ORAL_TABLET | Freq: Two times a day (BID) | ORAL | Status: DC

## 2013-06-05 MED ORDER — HYDROCHLOROTHIAZIDE 12.5 MG PO CAPS
12.5000 mg | ORAL_CAPSULE | Freq: Every day | ORAL | Status: DC
  Administered 2013-06-05: 12.5 mg via ORAL
  Filled 2013-06-05: qty 1

## 2013-06-05 MED ORDER — METOCLOPRAMIDE HCL 5 MG/ML IJ SOLN
10.0000 mg | Freq: Once | INTRAMUSCULAR | Status: AC
  Administered 2013-06-05: 10 mg via INTRAMUSCULAR
  Filled 2013-06-05: qty 2

## 2013-06-05 MED ORDER — AMLODIPINE BESYLATE 5 MG PO TABS: 5.0000 mg | ORAL_TABLET | Freq: Every day | ORAL | Status: DC

## 2013-06-05 MED ORDER — KETOROLAC TROMETHAMINE 60 MG/2ML IM SOLN
60.0000 mg | Freq: Once | INTRAMUSCULAR | Status: AC
  Administered 2013-06-05: 60 mg via INTRAMUSCULAR
  Filled 2013-06-05: qty 2

## 2013-06-05 MED ORDER — LISINOPRIL 20 MG PO TABS
20.0000 mg | ORAL_TABLET | Freq: Every day | ORAL | Status: DC
  Administered 2013-06-05: 20 mg via ORAL
  Filled 2013-06-05: qty 1

## 2013-06-05 MED ORDER — AMLODIPINE BESYLATE 5 MG PO TABS
5.0000 mg | ORAL_TABLET | Freq: Every day | ORAL | Status: DC
  Administered 2013-06-05: 5 mg via ORAL
  Filled 2013-06-05: qty 1

## 2013-06-05 MED ORDER — LISINOPRIL-HYDROCHLOROTHIAZIDE 20-12.5 MG PO TABS: 1.0000 | ORAL_TABLET | Freq: Every day | ORAL | Status: DC

## 2013-06-05 MED ORDER — DIPHENHYDRAMINE HCL 50 MG/ML IJ SOLN
25.0000 mg | Freq: Once | INTRAMUSCULAR | Status: AC
  Administered 2013-06-05: 25 mg via INTRAMUSCULAR
  Filled 2013-06-05: qty 1

## 2013-06-05 MED ORDER — SPIRONOLACTONE 25 MG PO TABS: 25.0000 mg | ORAL_TABLET | Freq: Every day | ORAL | Status: DC

## 2013-06-05 NOTE — ED Notes (Addendum)
C/o headache x 2 days.  Reports history of same.  Also c/o elevated BP.

## 2013-06-05 NOTE — Discharge Instructions (Signed)
Continue your blood pressure medications. Continue to take Excedrin migraine for your headache. Followup with your doctor. Return if worsening.   Migraine Headache A migraine headache is an intense, throbbing pain on one or both sides of your head. A migraine can last for 30 minutes to several hours. CAUSES  The exact cause of a migraine headache is not always known. However, a migraine may be caused when nerves in the brain become irritated and release chemicals that cause inflammation. This causes pain. Certain things may also trigger migraines, such as:  Alcohol.  Smoking.  Stress.  Menstruation.  Aged cheeses.  Foods or drinks that contain nitrates, glutamate, aspartame, or tyramine.  Lack of sleep.  Chocolate.  Caffeine.  Hunger.  Physical exertion.  Fatigue.  Medicines used to treat chest pain (nitroglycerine), birth control pills, estrogen, and some blood pressure medicines. SIGNS AND SYMPTOMS  Pain on one or both sides of your head.  Pulsating or throbbing pain.  Severe pain that prevents daily activities.  Pain that is aggravated by any physical activity.  Nausea, vomiting, or both.  Dizziness.  Pain with exposure to bright lights, loud noises, or activity.  General sensitivity to bright lights, loud noises, or smells. Before you get a migraine, you may get warning signs that a migraine is coming (aura). An aura may include:  Seeing flashing lights.  Seeing bright spots, halos, or zig-zag lines.  Having tunnel vision or blurred vision.  Having feelings of numbness or tingling.  Having trouble talking.  Having muscle weakness. DIAGNOSIS  A migraine headache is often diagnosed based on:  Symptoms.  Physical exam.  A CT scan or MRI of your head. These imaging tests cannot diagnose migraines, but they can help rule out other causes of headaches. TREATMENT Medicines may be given for pain and nausea. Medicines can also be given to help  prevent recurrent migraines.  HOME CARE INSTRUCTIONS  Only take over-the-counter or prescription medicines for pain or discomfort as directed by your health care provider. The use of long-term narcotics is not recommended.  Lie down in a dark, quiet room when you have a migraine.  Keep a journal to find out what may trigger your migraine headaches. For example, write down:  What you eat and drink.  How much sleep you get.  Any change to your diet or medicines.  Limit alcohol consumption.  Quit smoking if you smoke.  Get 7 9 hours of sleep, or as recommended by your health care provider.  Limit stress.  Keep lights dim if bright lights bother you and make your migraines worse. SEEK IMMEDIATE MEDICAL CARE IF:   Your migraine becomes severe.  You have a fever.  You have a stiff neck.  You have vision loss.  You have muscular weakness or loss of muscle control.  You start losing your balance or have trouble walking.  You feel faint or pass out.  You have severe symptoms that are different from your first symptoms. MAKE SURE YOU:   Understand these instructions.  Will watch your condition.  Will get help right away if you are not doing well or get worse. Document Released: 12/23/2004 Document Revised: 10/13/2012 Document Reviewed: 08/30/2012 [facility] Patient Information 2014 [place].  Arterial Hypertension Arterial hypertension (high blood pressure) is a condition of elevated pressure in your blood vessels. Hypertension over a long period of time is a risk factor for strokes, heart attacks, and heart failure. It is also the leading cause of kidney (renal) failure.  CAUSES   In Adults -- Over 90% of all hypertension has no known cause. This is called essential or primary hypertension. In the other 10% of people with hypertension, the increase in blood pressure is caused by another disorder. This is called secondary hypertension. Important causes of secondary  hypertension are:  Heavy alcohol use.  Obstructive sleep apnea.  Hyperaldosterosim (Conn's syndrome).  Steroid use.  Chronic kidney failure.  Hyperparathyroidism.  Medications.  Renal artery stenosis.  Pheochromocytoma.  Cushing's disease.  Coarctation of the aorta.  Scleroderma renal crisis.  Licorice (in excessive amounts).  Drugs (cocaine, methamphetamine). Your caregiver can explain any items above that apply to you.  In Children -- Secondary hypertension is more common and should always be considered.  Pregnancy -- Few women of childbearing age have high blood pressure. However, up to 10% of them develop hypertension of pregnancy. Generally, this will not harm the woman. It may be a sign of 3 complications of pregnancy: preeclampsia, HELLP syndrome, and eclampsia. Follow up and control with medication is necessary. SYMPTOMS   This condition normally does not produce any noticeable symptoms. It is usually found during a routine exam.  Malignant hypertension is a late problem of high blood pressure. It may have the following symptoms:  Headaches.  Blurred vision.  End-organ damage (this means your kidneys, heart, lungs, and other organs are being damaged).  Stressful situations can increase the blood pressure. If a person with normal blood pressure has their blood pressure go up while being seen by their caregiver, this is often termed "white coat hypertension." Its importance is not known. It may be related with eventually developing hypertension or complications of hypertension.  Hypertension is often confused with mental tension, stress, and anxiety. DIAGNOSIS  The diagnosis is made by 3 separate blood pressure measurements. They are taken at least 1 week apart from each other. If there is organ damage from hypertension, the diagnosis may be made without repeat measurements. Hypertension is usually identified by having blood pressure readings:  Above 140/90  mmHg measured in both arms, at 3 separate times, over a couple weeks.  Over 130/80 mmHg should be considered a risk factor and may require treatment in patients with diabetes. Blood pressure readings over 120/80 mmHg are called "pre-hypertension" even in non-diabetic patients. To get a true blood pressure measurement, use the following guidelines. Be aware of the factors that can alter blood pressure readings.  Take measurements at least 1 hour after caffeine.  Take measurements 30 minutes after smoking and without any stress. This is another reason to quit smoking  it raises your blood pressure.  Use a proper cuff size. Ask your caregiver if you are not sure about your cuff size.  Most home blood pressure cuffs are automatic. They will measure systolic and diastolic pressures. The systolic pressure is the pressure reading at the start of sounds. Diastolic pressure is the pressure at which the sounds disappear. If you are elderly, measure pressures in multiple postures. Try sitting, lying or standing.  Sit at rest for a minimum of 5 minutes before taking measurements.  You should not be on any medications like decongestants. These are found in many cold medications.  Record your blood pressure readings and review them with your caregiver. If you have hypertension:  Your caregiver may do tests to be sure you do not have secondary hypertension (see "causes" above).  Your caregiver may also look for signs of metabolic syndrome. This is also called Syndrome X or Insulin  Resistance Syndrome. You may have this syndrome if you have type 2 diabetes, abdominal obesity, and abnormal blood lipids in addition to hypertension.  Your caregiver will take your medical and family history and perform a physical exam.  Diagnostic tests may include blood tests (for glucose, cholesterol, potassium, and kidney function), a urinalysis, or an EKG. Other tests may also be necessary depending on your  condition. PREVENTION  There are important lifestyle issues that you can adopt to reduce your chance of developing hypertension:  Maintain a normal weight.  Limit the amount of salt (sodium) in your diet.  Exercise often.  Limit alcohol intake.  Get enough potassium in your diet. Discuss specific advice with your caregiver.  Follow a DASH diet (dietary approaches to stop hypertension). This diet is rich in fruits, vegetables, and low-fat dairy products, and avoids certain fats. PROGNOSIS  Essential hypertension cannot be cured. Lifestyle changes and medical treatment can lower blood pressure and reduce complications. The prognosis of secondary hypertension depends on the underlying cause. Many people whose hypertension is controlled with medicine or lifestyle changes can live a normal, healthy life.  RISKS AND COMPLICATIONS  While high blood pressure alone is not an illness, it often requires treatment due to its short- and long-term effects on many organs. Hypertension increases your risk for:  CVAs or strokes (cerebrovascular accident).  Heart failure due to chronically high blood pressure (hypertensive cardiomyopathy).  Heart attack (myocardial infarction).  Damage to the retina (hypertensive retinopathy).  Kidney failure (hypertensive nephropathy). Your caregiver can explain list items above that apply to you. Treatment of hypertension can significantly reduce the risk of complications. TREATMENT   For overweight patients, weight loss and regular exercise are recommended. Physical fitness lowers blood pressure.  Mild hypertension is usually treated with diet and exercise. A diet rich in fruits and vegetables, fat-free dairy products, and foods low in fat and salt (sodium) can help lower blood pressure. Decreasing salt intake decreases blood pressure in a 1/3 of people.  Stop smoking if you are a smoker. The steps above are highly effective in reducing blood pressure. While  these actions are easy to suggest, they are difficult to achieve. Most patients with moderate or severe hypertension end up requiring medications to bring their blood pressure down to a normal level. There are several classes of medications for treatment. Blood pressure pills (antihypertensives) will lower blood pressure by their different actions. Lowering the blood pressure by 10 mmHg may decrease the risk of complications by as much as 25%. The goal of treatment is effective blood pressure control. This will reduce your risk for complications. Your caregiver will help you determine the best treatment for you according to your lifestyle. What is excellent treatment for one person, may not be for you. HOME CARE INSTRUCTIONS   Do not smoke.  Follow the lifestyle changes outlined in the "Prevention" section.  If you are on medications, follow the directions carefully. Blood pressure medications must be taken as prescribed. Skipping doses reduces their benefit. It also puts you at risk for problems.  Follow up with your caregiver, as directed.  If you are asked to monitor your blood pressure at home, follow the guidelines in the "Diagnosis" section above. SEEK MEDICAL CARE IF:   You think you are having medication side effects.  You have recurrent headaches or lightheadedness.  You have swelling in your ankles.  You have trouble with your vision. SEEK IMMEDIATE MEDICAL CARE IF:   You have sudden onset of chest  pain or pressure, difficulty breathing, or other symptoms of a heart attack.  You have a severe headache.  You have symptoms of a stroke (such as sudden weakness, difficulty speaking, difficulty walking). MAKE SURE YOU:   Understand these instructions.  Will watch your condition.  Will get help right away if you are not doing well or get worse. Document Released: 12/23/2004 Document Revised: 03/17/2011 Document Reviewed: 07/23/2006 [facility] Patient Information 2014  [place].

## 2013-06-05 NOTE — ED Provider Notes (Signed)
CSN: [id number]     Arrival date & time 06/05/13  1815 History   First MD Initiated Contact with Patient 06/05/13 1845     Chief Complaint  Patient presents with  . Headache     (Consider location/radiation/quality/duration/timing/severity/associated sxs/prior Treatment) HPI Katelyn [name] is a 38 y.o. female  who presents emergency department complaining of a headache. Patient states she has history of migraines. She started having right-sided headache 2 days ago. Feels like her regular migraine. Reports associated photophobia, nausea. Denies any fever, neck pain or stiffness. Denies any head injuries. Onset of pain is gradual. Take Excedrin migraine with no relief. Also states ran out of her blood pressure medicines 2 days ago. States unable to get an appointment for few weeks. Denies sudden onset of thunderclap headache. Denies any vomiting. No numbness or weakness in extremities. No visual changes. No other complaints.  Past Medical History  Diagnosis Date  . Hypertension   . Palpitations   . Chest pain   . Anemia   . Anxiety   . Depression   . Migraine    Past Surgical History  Procedure Laterality Date  . Hysteroscopy w/ endometrial ablation    . Tubal ligation    . Abdominal hysterectomy     Family History  Problem Relation Age of Onset  . Diabetes Mother   . Depression Mother   . Depression Sister    History  Substance Use Topics  . Smoking status: Current Every Day Smoker -- 0.50 packs/day    Types: Cigarettes    Start date: 01/06/1994  . Smokeless tobacco: Never Used  . Alcohol Use: 2.0 oz/week    4 drink(s) per week     Comment: occasional   OB History   Grav Para Term Preterm Abortions TAB SAB Ect Mult Living                 Review of Systems  Constitutional: Negative for fever and chills.  HENT: Negative for congestion.   Eyes: Positive for photophobia. Negative for pain and visual disturbance.  Respiratory: Negative for cough, chest tightness and  shortness of breath.   Cardiovascular: Negative for chest pain, palpitations and leg swelling.  Gastrointestinal: Negative for nausea, vomiting, abdominal pain and diarrhea.  Genitourinary: Negative for dysuria, flank pain and pelvic pain.  Musculoskeletal: Negative for arthralgias, myalgias, neck pain and neck stiffness.  Skin: Negative for rash.  Neurological: Positive for headaches. Negative for dizziness and weakness.  All other systems reviewed and are negative.     Allergies  Metoprolol  Home Medications   Prior to Admission medications   Medication Sig Start Date End Date Taking? Authorizing Provider  amLODipine (NORVASC) 5 MG tablet Take 1 tablet (5 mg total) by mouth daily. 12/29/12  Yes Angelica Ran, MD  aspirin-acetaminophen-caffeine (EXCEDRIN MIGRAINE) [phone] MG per tablet Take 1 tablet by mouth every 6 (six) hours as needed for headache.   Yes Historical Provider, MD  lisinopril-hydrochlorothiazide (PRINZIDE,ZESTORETIC) 20-12.5 MG per tablet Take 1 tablet by mouth 2 (two) times daily.   Yes Historical Provider, MD  sertraline (ZOLOFT) 100 MG tablet Take 100 mg by mouth daily.   Yes Historical Provider, MD  spironolactone (ALDACTONE) 25 MG tablet Take 25 mg by mouth daily.   Yes Historical Provider, MD   BP 214/129  Pulse 78  Temp(Src) 98.8 F (37.1 C) (Oral)  Resp 18  SpO2 99% Physical Exam  Nursing note and vitals reviewed. Constitutional: She is oriented to person, place,  and time. She appears well-developed and well-nourished. No distress.  HENT:  Head: Normocephalic.  Nose: Nose normal.  Mouth/Throat: Oropharynx is clear and moist.  Sinuses nontender bilaterally  Eyes: Conjunctivae and EOM are normal. Pupils are equal, round, and reactive to light.  Neck: Normal range of motion. Neck supple.  Cardiovascular: Normal rate, regular rhythm and normal heart sounds.   Pulmonary/Chest: Effort normal and breath sounds normal. No respiratory distress. She  has no wheezes. She has no rales.  Abdominal: Soft. Bowel sounds are normal. She exhibits no distension. There is no tenderness. There is no rebound.  Musculoskeletal: She exhibits no edema.  Neurological: She is alert and oriented to person, place, and time. No cranial nerve deficit. Coordination normal.  5/5 and equal upper and lower extremity strength bilaterally. Equal grip strength bilaterally. Normal finger to nose and heel to shin. No pronator drift. Gait is normal  Skin: Skin is warm and dry.  Psychiatric: She has a normal mood and affect. Her behavior is normal.    ED Course  Procedures (including critical care time) Labs Review Labs Reviewed - No data to display  Imaging Review No results found.   EKG Interpretation None      MDM   Final diagnoses:  Headache  Hypertension    Patient is here with typical for her migraine onset 2 days ago. Onset of pain is gradual. She has history of the same. No relief with Excedrin Migraine. Her blood pressure was found to be very high. Initially 213/123, It came down no medication and is currently 180/100. No other intracranial hemorrhage considered I do not think that is what she has a normal neurological exam and the pain she describes a. I will treat her headache with migraine cocktail. Will reassess. Also ordered her regular blood pressure medications here.   8:58 PM Patient's headache improved, currently 2/10. She's feeling much better and ready to go home. Her blood pressure also improved and 248G systolic. She just now received her blood pressure medications. I will give her prescription for a month refill until she is able to get in with her Dr. She has no neurological findings on the exam, she is was pain-free, I do not suspect any intracranial bleeding or any other major abnormalities. Home with close followup. Return precautions discussed.   Filed Vitals:   06/05/13 1852 06/05/13 1852 06/05/13 1930 06/05/13 2030  BP:  221/123 214/129 184/118 174/111  Pulse: 78  68 75  Temp:      TempSrc:      Resp: 18  18 18   SpO2: 99%  100% 100%     Renold Genta, PA-C 06/05/13 2059

## 2013-06-06 NOTE — ED Provider Notes (Signed)
Medical screening examination/treatment/procedure(s) were performed by non-physician practitioner and as supervising physician I was immediately available for consultation/collaboration.   EKG Interpretation None       Richarda Blade, MD 06/06/13 1118

## 2013-06-07 ENCOUNTER — Encounter: Payer: Self-pay | Admitting: Family Medicine

## 2013-06-07 ENCOUNTER — Ambulatory Visit (INDEPENDENT_AMBULATORY_CARE_PROVIDER_SITE_OTHER): Payer: Commercial Indemnity | Admitting: Family Medicine

## 2013-06-07 VITALS — BP 188/112 | HR 73 | Ht 69.0 in | Wt 172.0 lb

## 2013-06-07 DIAGNOSIS — G43009 Migraine without aura, not intractable, without status migrainosus: Secondary | ICD-10-CM

## 2013-06-07 DIAGNOSIS — I10 Essential (primary) hypertension: Secondary | ICD-10-CM

## 2013-06-07 DIAGNOSIS — F17200 Nicotine dependence, unspecified, uncomplicated: Secondary | ICD-10-CM

## 2013-06-07 MED ORDER — NICOTINE 14 MG/24HR TD PT24: 14.0000 mg | MEDICATED_PATCH | Freq: Every day | TRANSDERMAL | Status: DC

## 2013-06-07 MED ORDER — NICOTINE 7 MG/24HR TD PT24: 7.0000 mg | MEDICATED_PATCH | Freq: Every day | TRANSDERMAL | Status: DC

## 2013-06-07 MED ORDER — AMLODIPINE BESYLATE 10 MG PO TABS: 10.0000 mg | ORAL_TABLET | Freq: Every day | ORAL | Status: DC

## 2013-06-07 MED ORDER — NICOTINE 21 MG/24HR TD PT24: 21.0000 mg | MEDICATED_PATCH | Freq: Every day | TRANSDERMAL | Status: DC

## 2013-06-07 MED ORDER — CLONIDINE HCL 0.1 MG PO TABS
0.2000 mg | ORAL_TABLET | Freq: Once | ORAL | Status: AC
  Administered 2013-06-07: 0.2 mg via ORAL

## 2013-06-07 NOTE — Assessment & Plan Note (Signed)
A: no evidence of end organ damage but pt at high risk for stroke and renal damage given level of BP P: - give clonidine 0.2 mg PO in clinic with mild improvement in BP to 184/112 - continue lisinopril-HCTZ and spironolactone - start norvasc 10 mg - f/u in 1 week and consider hydralazine

## 2013-06-07 NOTE — Progress Notes (Signed)
Subjective:    Patient ID: Katelyn [name], female    DOB: 08/27/1975, 38 y.o.   MRN: [id number]  HPI  38 year old F with chronic headaches (migraine type) and hypertension.   Hypertension, Chronically uncontrolled - Pt seen in ED on 06/05/13 and BP > 210/110 on admission. She has not been taking her medication at home. The reason is unclear. She was also having a headache at that time. She was given PO amlodipine, lisinopril, and HCTZ in the ED. This lowered her BP to 174/111. There was no evidence of end organ damage so she was discharged home. Prescriptions for medication were sent to the pharmacy. However, the patient not picked them up yet. However, she has lisinopril-HCTZ and spironolactone at home, which she took this morning. She is not taking norvasc or metoprolol which were previously prescribed. She notes that the metoprolol made her nauseous, so she stopped taking it.   Office BP: BP Readings from Last 3 Encounters:  06/07/13 199/121  06/05/13 177/117  03/01/13 191/136    Prescribed meds: see below  Hypertension ROS:  Taking medications as prescribed:No Chest pain: No Shortness of breath: No Swelling of extremities: No TIA symptoms: No Regular exercise: No Low Na+ diet: No Alcohol/tobacco/drug use: Yes - pt smoking > 1/2 ppd  Headaches - Chronic problem for the patient with numerous trips to the ED and visits to the office for this issue; she was seen in the ED 3 days ago for a severe headache with migrainous features and given an injection of toradol, reglan and benadryl. She notes that this helped, and she denies a headache at this time. She was prescribed metoprolol in August 2014 for headache prophylaxis and BP treatment, but she stopped taking this due to nausea.   Tobacco Use - pt [place] smoking > 1/2 ppd, she would like to quit, desire is a 10/10 and confidence is 10/10; pt would like to try nicotine replacement therapy; she previously quit for a period of 4 years  without medication assistance but started back last year due to stress   Current Outpatient Prescriptions on File Prior to Visit  Medication Sig Dispense Refill  . aspirin-acetaminophen-caffeine (EXCEDRIN MIGRAINE) 250-250-65 MG per tablet Take 1 tablet by mouth every 6 (six) hours as needed for headache.      . lisinopril-hydrochlorothiazide (ZESTORETIC) 20-12.5 MG per tablet Take 1 tablet by mouth daily.  30 tablet  0  . sertraline (ZOLOFT) 100 MG tablet Take 100 mg by mouth daily.      Marland Kitchen spironolactone (ALDACTONE) 25 MG tablet Take 1 tablet (25 mg total) by mouth daily.  30 tablet  0  . [DISCONTINUED] citalopram (CELEXA) 10 MG tablet Take 1 tablet (10 mg total) by mouth daily.  30 tablet  2  . [DISCONTINUED] omeprazole (PRILOSEC) 20 MG capsule Take 1 capsule (20 mg total) by mouth daily.  30 capsule  2  . [DISCONTINUED] potassium chloride SA (K-DUR,KLOR-CON) 20 MEQ tablet Take 1 tablet (20 mEq total) by mouth daily. For 7 days  7 tablet  0   No current facility-administered medications on file prior to visit.     Review of Systems See HPI    Objective:   Physical Exam BP 199/121  Pulse 73  Ht 5\' 9"  (1.753 m)  Wt 172 lb (78.019 kg)  BMI 25.39 kg/m2   RSW:NIOEVO age AA female, mild distress but non ill  HEENT: NCAT, PERRLA, EOMI, OP clear and moist, optic disc difficult to visualize,  no venous pulsations and no AV nicking  CV: RRR, no m/r/g, no JVD or carotid bruits  Pulm: normal WOB, CTA-B  Neuro/Psych: A&Ox4, normal affect, speech, and thought content; CN II-XII grossly intact      Assessment & Plan:

## 2013-06-07 NOTE — Assessment & Plan Note (Signed)
Improved since 3 days ago. Pt may benefit from topamax or other prophylactic if headaches resumes frequency even when BP improved.

## 2013-06-07 NOTE — Patient Instructions (Signed)
Dear Katelyn [name],    Thank you for coming to clinic today. Please read below regarding the issues that we discussed.   1. Headaches - We have to get the blood pressure under control in order to reduce the headache. I am glad that this is improving.  2. Hypertension - Please continue your current medications. I would also like you to start Norvasc 10 mg daily.  3. Cigarette smoking - I am very excited that you are ready to quit. I know you can do it. I want you to use the 21 mg nicotine patch each day for 6 weeks. He can go down to the 14 mg nicotine patch for 2 weeks. Followed by the 7 mg nicotine patch for 2 weeks. Please call the QUIT LINE at 1-800-QUIT-NOW. This is a great resource to support you along the way.   Please follow up in clinic in 7-10 days. Please call earlier if you have any questions or concerns.   Sincerely,   Dr. Maricela Bo

## 2013-06-07 NOTE — Assessment & Plan Note (Signed)
Pt ready and has a plan. Given directions for nicotine patch use and number of 1-800-QUIT NOW.

## 2013-06-10 ENCOUNTER — Ambulatory Visit: Payer: Commercial Indemnity | Admitting: Family Medicine

## 2013-06-11 ENCOUNTER — Encounter (HOSPITAL_COMMUNITY): Payer: Self-pay | Admitting: Emergency Medicine

## 2013-06-11 ENCOUNTER — Emergency Department (HOSPITAL_COMMUNITY): Payer: Commercial Indemnity

## 2013-06-11 ENCOUNTER — Emergency Department (HOSPITAL_COMMUNITY)
Admission: EM | Admit: 2013-06-11 | Discharge: 2013-06-11 | Disposition: A | Discharge status: Home / Self Care | Payer: Commercial Indemnity | Attending: Emergency Medicine | Admitting: Emergency Medicine

## 2013-06-11 DIAGNOSIS — R112 Nausea with vomiting, unspecified: Secondary | ICD-10-CM | POA: Insufficient documentation

## 2013-06-11 DIAGNOSIS — R519 Headache, unspecified: Secondary | ICD-10-CM

## 2013-06-11 DIAGNOSIS — F17200 Nicotine dependence, unspecified, uncomplicated: Secondary | ICD-10-CM | POA: Insufficient documentation

## 2013-06-11 DIAGNOSIS — F411 Generalized anxiety disorder: Secondary | ICD-10-CM | POA: Insufficient documentation

## 2013-06-11 DIAGNOSIS — R55 Syncope and collapse: Secondary | ICD-10-CM

## 2013-06-11 DIAGNOSIS — Z888 Allergy status to other drugs, medicaments and biological substances status: Secondary | ICD-10-CM | POA: Insufficient documentation

## 2013-06-11 DIAGNOSIS — F3289 Other specified depressive episodes: Secondary | ICD-10-CM | POA: Insufficient documentation

## 2013-06-11 DIAGNOSIS — D649 Anemia, unspecified: Secondary | ICD-10-CM | POA: Insufficient documentation

## 2013-06-11 DIAGNOSIS — R002 Palpitations: Secondary | ICD-10-CM | POA: Insufficient documentation

## 2013-06-11 DIAGNOSIS — I10 Essential (primary) hypertension: Secondary | ICD-10-CM | POA: Insufficient documentation

## 2013-06-11 DIAGNOSIS — E876 Hypokalemia: Secondary | ICD-10-CM | POA: Insufficient documentation

## 2013-06-11 DIAGNOSIS — Z79899 Other long term (current) drug therapy: Secondary | ICD-10-CM | POA: Insufficient documentation

## 2013-06-11 DIAGNOSIS — R42 Dizziness and giddiness: Secondary | ICD-10-CM | POA: Insufficient documentation

## 2013-06-11 DIAGNOSIS — R51 Headache: Secondary | ICD-10-CM | POA: Insufficient documentation

## 2013-06-11 DIAGNOSIS — Z8669 Personal history of other diseases of the nervous system and sense organs: Secondary | ICD-10-CM | POA: Insufficient documentation

## 2013-06-11 DIAGNOSIS — F329 Major depressive disorder, single episode, unspecified: Secondary | ICD-10-CM | POA: Insufficient documentation

## 2013-06-11 LAB — I-STAT CHEM 8, ED
BUN: 9 mg/dL (ref 6–23)
Calcium, Ion: 1.19 mmol/L (ref 1.12–1.23)
Chloride: 97 mEq/L (ref 96–112)
Creatinine, Ser: 0.9 mg/dL (ref 0.50–1.10)
Glucose, Bld: 114 mg/dL — ABNORMAL HIGH (ref 70–99)
HCT: 50 % — ABNORMAL HIGH (ref 36.0–46.0)
Hemoglobin: 17 g/dL — ABNORMAL HIGH (ref 12.0–15.0)
Potassium: 3.1 mEq/L — ABNORMAL LOW (ref 3.7–5.3)
Sodium: 138 mEq/L (ref 137–147)
TCO2: 23 mmol/L (ref 0–100)

## 2013-06-11 LAB — URINALYSIS, ROUTINE W REFLEX MICROSCOPIC
Bilirubin Urine: NEGATIVE
GLUCOSE, UA: NEGATIVE mg/dL
HGB URINE DIPSTICK: NEGATIVE
Ketones, ur: NEGATIVE mg/dL
Nitrite: NEGATIVE
PROTEIN: NEGATIVE mg/dL
Specific Gravity, Urine: 1.018 (ref 1.005–1.030)
UROBILINOGEN UA: 1 mg/dL (ref 0.0–1.0)
pH: 6.5 (ref 5.0–8.0)

## 2013-06-11 LAB — URINE MICROSCOPIC-ADD ON

## 2013-06-11 LAB — I-STAT TROPONIN, ED: Troponin i, poc: 0 ng/mL (ref 0.00–0.08)

## 2013-06-11 LAB — PREGNANCY, URINE: Preg Test, Ur: NEGATIVE

## 2013-06-11 MED ORDER — SODIUM CHLORIDE 0.9 % IV BOLUS (SEPSIS)
1000.0000 mL | Freq: Once | INTRAVENOUS | Status: AC
  Administered 2013-06-11: 1000 mL via INTRAVENOUS

## 2013-06-11 MED ORDER — METOCLOPRAMIDE HCL 5 MG/ML IJ SOLN
10.0000 mg | Freq: Once | INTRAMUSCULAR | Status: AC
  Administered 2013-06-11: 10 mg via INTRAVENOUS
  Filled 2013-06-11: qty 2

## 2013-06-11 MED ORDER — DEXAMETHASONE SODIUM PHOSPHATE 10 MG/ML IJ SOLN
10.0000 mg | Freq: Once | INTRAMUSCULAR | Status: AC
  Administered 2013-06-11: 10 mg via INTRAVENOUS
  Filled 2013-06-11: qty 1

## 2013-06-11 MED ORDER — POTASSIUM CHLORIDE CRYS ER 20 MEQ PO TBCR
40.0000 meq | EXTENDED_RELEASE_TABLET | Freq: Once | ORAL | Status: AC
  Administered 2013-06-11: 40 meq via ORAL
  Filled 2013-06-11: qty 2

## 2013-06-11 MED ORDER — KETOROLAC TROMETHAMINE 30 MG/ML IJ SOLN
30.0000 mg | Freq: Once | INTRAMUSCULAR | Status: AC
  Administered 2013-06-11: 30 mg via INTRAVENOUS
  Filled 2013-06-11: qty 1

## 2013-06-11 NOTE — ED Provider Notes (Signed)
CSN: [id number]     Arrival date & time 06/11/13  1826 History   First MD Initiated Contact with Patient 06/11/13 1828     Chief Complaint  Patient presents with  . Near Syncope  . Headache    (Consider location/radiation/quality/duration/timing/severity/associated sxs/prior Treatment) HPI Comments: Patient is a 38 year old female with a history of hypertension, anemia, and migraine headaches who presents to the emergency department after a syncopal episode. Patient states that she was sitting at baseline machine, gambling, when her mother saw her slumped forward states "I think she passed out because her head dropped and I was calling her name and she did not respond". Mother states that patient was unresponsive for no more than 5 minutes. After regaining consciousness, mother states that it took patient a few seconds to begin mentating at baseline. Patient states the next thing she remembers is feeling nauseous. She had one episode of nonbloody emesis as a result. She also states she noticed a right-sided headache consistent with prior migraines. She states that this headache is NOT classified as the "worse headache of her life". She states she did feel lightheaded prior to her syncopal episode. Patient denies associated vision changes or vision loss, tinnitus or hearing loss, difficulty speaking or swallowing, neck pain or stiffness, chest pain, palpitations, shortness of breath, abdominal pain, back pain, numbness/tingling, and extremity weakness. Mother denies seizure activity.  Patient is a 38 y.o. female presenting with near-syncope and headaches. The history is provided by the patient. No language interpreter was used.  Near Syncope Associated symptoms include headaches, nausea and vomiting. Pertinent negatives include no chest pain, fever, neck pain, numbness or weakness.  Headache Associated symptoms: nausea, near-syncope and vomiting   Associated symptoms: no fever, no neck pain, no neck  stiffness and no numbness     Past Medical History  Diagnosis Date  . Hypertension   . Palpitations   . Chest pain   . Anemia   . Anxiety   . Depression   . Migraine    Past Surgical History  Procedure Laterality Date  . Hysteroscopy w/ endometrial ablation    . Tubal ligation    . Abdominal hysterectomy     Family History  Problem Relation Age of Onset  . Diabetes Mother   . Depression Mother   . Depression Sister    History  Substance Use Topics  . Smoking status: Current Every Day Smoker -- 0.50 packs/day    Types: Cigarettes    Start date: 01/06/1994  . Smokeless tobacco: Never Used  . Alcohol Use: 2.0 oz/week    4 drink(s) per week     Comment: occasional   OB History   Grav Para Term Preterm Abortions TAB SAB Ect Mult Living                  Review of Systems  Constitutional: Negative for fever.  HENT: Negative for trouble swallowing.   Respiratory: Negative for shortness of breath.   Cardiovascular: Positive for near-syncope. Negative for chest pain and palpitations.  Gastrointestinal: Positive for nausea and vomiting.  Musculoskeletal: Negative for neck pain and neck stiffness.  Neurological: Positive for syncope, light-headedness and headaches. Negative for weakness and numbness.  All other systems reviewed and are negative.    Allergies  Metoprolol  Home Medications   Prior to Admission medications   Medication Sig Start Date End Date Taking? Authorizing Provider  amLODipine (NORVASC) 10 MG tablet Take 1 tablet (10 mg total) by mouth daily. 06/07/13  Yes Angelica Ran, MD  aspirin-acetaminophen-caffeine (EXCEDRIN MIGRAINE) [phone] MG per tablet Take 1 tablet by mouth every 6 (six) hours as needed for headache.   Yes Historical Provider, MD  lisinopril-hydrochlorothiazide (ZESTORETIC) 20-12.5 MG per tablet Take 1 tablet by mouth daily. 06/05/13  Yes Tatyana A Kirichenko, PA-C  sertraline (ZOLOFT) 100 MG tablet Take 100 mg by mouth daily.    Yes Historical Provider, MD  spironolactone (ALDACTONE) 25 MG tablet Take 1 tablet (25 mg total) by mouth daily. 06/05/13  Yes Tatyana A Kirichenko, PA-C  nicotine (NICODERM CQ - DOSED IN MG/24 HOURS) 21 mg/24hr patch Place 1 patch (21 mg total) onto the skin daily. 06/07/13   Angelica Ran, MD  nicotine (NICODERM CQ - DOSED IN MG/24 HR) 7 mg/24hr patch Place 1 patch (7 mg total) onto the skin daily. 06/07/13   Angelica Ran, MD  nicotine (NICODERM CQ) 14 mg/24hr patch Place 1 patch (14 mg total) onto the skin daily. 06/07/13   Angelica Ran, MD   BP 137/92  Pulse 74  Temp(Src) 98.1 F (36.7 C) (Oral)  Resp 15  Ht 5\' 9"  (1.753 m)  Wt 172 lb (78.019 kg)  BMI 25.39 kg/m2  SpO2 100%  Physical Exam  Nursing note and vitals reviewed. Constitutional: She is oriented to person, place, and time. She appears well-developed and well-nourished. No distress.  Nontoxic/nonseptic appearing  HENT:  Head: Normocephalic and atraumatic.  Mouth/Throat: Oropharynx is clear and moist. No oropharyngeal exudate.  Eyes: Conjunctivae and EOM are normal. Pupils are equal, round, and reactive to light. No scleral icterus.  No nystagmus  Neck: Normal range of motion.  No nuchal rigidity or meningismus  Cardiovascular: Normal rate, regular rhythm and normal heart sounds.   No carotid bruits b/l  Pulmonary/Chest: Effort normal and breath sounds normal. No respiratory distress. She has no wheezes. She has no rales.  Chest expansion symmetric  Abdominal: Soft. She exhibits no distension. There is no tenderness. There is no rebound and no guarding.  Soft, nontender  Musculoskeletal: Normal range of motion.  Neurological: She is alert and oriented to person, place, and time. She has normal reflexes. No cranial nerve deficit. She exhibits normal muscle tone. Coordination normal.  GCS 15. Patient speaks in full goal oriented sentences. No cranial nerve deficits appreciated; symmetric eyebrow raise, no  facial drooping, equal tongue protrusion, normal shoulder shrugging. Patient moves extremities without ataxia. No gross sensory deficits on exam. Normal finger to nose. DTRs normal and symmetric.  Skin: Skin is warm and dry. No rash noted. She is not diaphoretic. No erythema. No pallor.  Psychiatric: She has a normal mood and affect. Her behavior is normal.    ED Course  Procedures (including critical care time) Labs Review Labs Reviewed  URINALYSIS, ROUTINE W REFLEX MICROSCOPIC - Abnormal; Notable for the following:    Leukocytes, UA SMALL (*)    All other components within normal limits  I-STAT CHEM 8, ED - Abnormal; Notable for the following:    Potassium 3.1 (*)    Glucose, Bld 114 (*)    Hemoglobin 17.0 (*)    HCT 50.0 (*)    All other components within normal limits  PREGNANCY, URINE  URINE MICROSCOPIC-ADD ON  I-STAT TROPOININ, ED    Imaging Review Ct Head Wo Contrast  06/11/2013   CLINICAL DATA:  Headache and syncope  EXAM: CT HEAD WITHOUT CONTRAST  TECHNIQUE: Contiguous axial images were obtained from the base of the skull through the vertex  without intravenous contrast. Study was obtained within 24 hr of patient's arrival at the emergency department.  COMPARISON:  March 01, 2013  FINDINGS: The ventricles are normal in size and configuration. There is a small cavum septum pellucidum, an anatomic variant. There is no mass, hemorrhage, extra-axial fluid collection, or midline shift. The gray-white compartments are normal. There is no demonstrable acute infarct. The bony calvarium appears intact. The mastoid air cells are clear.  IMPRESSION: Study within normal limits.   Electronically Signed   By: Lowella Grip M.D.   On: 06/11/2013 20:08     EKG Interpretation   Date/Time:  Saturday June 11 2013 19:21:12 EDT Ventricular Rate:  69 PR Interval:  188 QRS Duration: 94 QT Interval:  338 QTC Calculation: 362 R Axis:   -44 Text Interpretation:  Sinus rhythm Left atrial  enlargement Abnormal R-wave  progression, early transition Left ventricular hypertrophy No significant  change since last tracing Confirmed by ALLEN  MD, ANTHONY ([id number]) on  06/11/2013 8:29:03 PM      MDM   Final diagnoses:  Headache  Syncope  Hypokalemia    38 year old female presents to the emergency department for headache which began after a syncopal episode. Syncope was preceded by sensation of lightheadedness. No chest pain, palpitations, or shortness of breath. No seizure activity or incontinence. Patient well and nontoxic appearing, hemodynamically stable, and afebrile on arrival. Neurologic exam nonfocal. Orthostatic vital signs stable. She states that headache is consistent with prior migraines which she has been experiencing on and off over the last 6 days.  CT head ordered which is no evidence of acute hemorrhage, hydrocephalus, midline shift, or mass lesion. Labs significant only for hypokalemia. No evidence of anemia. Troponin 0.00. EKG stable compared to prior. Urine pregnancy negative. Patient has had complete resolution in her headache with migraine cocktail.  Workup in the ED has been reassuring. Do not believe patient warrants further evaluation of symptoms as an inpatient at this time. Return precautions discussed with patient and PCP f/u on Monday advised. Patient agreeable to plan with no unaddressed concerns.   Filed Vitals:   06/11/13 1945 06/11/13 2033 06/11/13 2034 06/11/13 2035  BP:  132/85 133/93 137/92  Pulse:  64 66 74  Temp: 98.1 F (36.7 C)     TempSrc:      Resp:      Height:      Weight:      SpO2:         Antonietta Breach, PA-C 06/11/13 2103

## 2013-06-11 NOTE — ED Provider Notes (Signed)
Medical screening examination/treatment/procedure(s) were performed by non-physician practitioner and as supervising physician I was immediately available for consultation/collaboration.   Leota Jacobsen, MD 06/11/13 [phone]

## 2013-06-11 NOTE — ED Notes (Signed)
Pt arrived by [facility] from [place] building. Pt mother at bedside stated " I think she passed out because her head dropped and I was calling her name and she didn't say anything to me, then she started throwing up".  Pt c/o headache with hx of migraine. Stated that she was here Sunday with Migraine as well. Denies any CP or dizziness.  BP-140/80 HR-62 O2sat- 99%RA

## 2013-06-11 NOTE — Discharge Instructions (Signed)
Your workup today in the emergency department was reassuring. We do not believe that you need to stay in the hospital for further evaluation, but do recommend that you follow up with your primary care provider on Monday. Be sure to drink plenty of fluids. Return to the emergency department if symptoms worsen or recur.  Syncope Syncope is a fainting spell. This means the person loses consciousness and drops to the ground. The person is generally unconscious for less than 5 minutes. The person may have some muscle twitches for up to 15 seconds before waking up and returning to normal. Syncope occurs more often in elderly people, but it can happen to anyone. While most causes of syncope are not dangerous, syncope can be a sign of a serious medical problem. It is important to seek medical care.  CAUSES  Syncope is caused by a sudden decrease in blood flow to the brain. The specific cause is often not determined. Factors that can trigger syncope include:  Taking medicines that lower blood pressure.  Sudden changes in posture, such as standing up suddenly.  Taking more medicine than prescribed.  Standing in one place for too long.  Seizure disorders.  Dehydration and excessive exposure to heat.  Low blood sugar (hypoglycemia).  Straining to have a bowel movement.  Heart disease, irregular heartbeat, or other circulatory problems.  Fear, emotional distress, seeing blood, or severe pain. SYMPTOMS  Right before fainting, you may:  Feel dizzy or lightheaded.  Feel nauseous.  See all white or all black in your field of vision.  Have cold, clammy skin. DIAGNOSIS  Your caregiver will ask about your symptoms, perform a physical exam, and perform electrocardiography (ECG) to record the electrical activity of your heart. Your caregiver may also perform other heart or blood tests to determine the cause of your syncope. TREATMENT  In most cases, no treatment is needed. Depending on the cause of  your syncope, your caregiver may recommend changing or stopping some of your medicines. HOME CARE INSTRUCTIONS  Have someone stay with you until you feel stable.  Do not drive, operate machinery, or play sports until your caregiver says it is okay.  Keep all follow-up appointments as directed by your caregiver.  Lie down right away if you start feeling like you might faint. Breathe deeply and steadily. Wait until all the symptoms have passed.  Drink enough fluids to keep your urine clear or pale yellow.  If you are taking blood pressure or heart medicine, get up slowly, taking several minutes to sit and then stand. This can reduce dizziness. SEEK IMMEDIATE MEDICAL CARE IF:   You have a severe headache.  You have unusual pain in the chest, abdomen, or back.  You are bleeding from the mouth or rectum, or you have black or tarry stool.  You have an irregular or very fast heartbeat.  You have pain with breathing.  You have repeated fainting or seizure-like jerking during an episode.  You faint when sitting or lying down.  You have confusion.  You have difficulty walking.  You have severe weakness.  You have vision problems. If you fainted, call your local emergency services (911 in [place] Do not drive yourself to the hospital.  MAKE SURE YOU:  Understand these instructions.  Will watch your condition.  Will get help right away if you are not doing well or get worse. Document Released: 12/23/2004 Document Revised: 06/24/2011 Document Reviewed: 02/21/2011 [facility] Patient Information 2014 [place].

## 2013-06-11 NOTE — ED Provider Notes (Signed)
Medical screening examination/treatment/procedure(s) were conducted as a shared visit with non-physician practitioner(s) and myself.  I personally evaluated the patient during the encounter.   EKG Interpretation   Date/Time:  Saturday June 11 2013 19:21:12 EDT Ventricular Rate:  69 PR Interval:  188 QRS Duration: 94 QT Interval:  338 QTC Calculation: 362 R Axis:   -44 Text Interpretation:  Sinus rhythm Left atrial enlargement Abnormal R-wave  progression, early transition Left ventricular hypertrophy No significant  change since last tracing Confirmed by Karee Christopherson  MD, Minoru Chap ([id number]) on  06/11/2013 8:29:03 PM       Patient here after having a syncopal event while she was sitting. She became lightheaded and dizzy. She denies any palpitations. No chest pain chest pressure. He was brief in nature no seizure activity noted. Work appears been reassuring and she will followup with her primary care Dr.   Leota Jacobsen, MD 06/11/13 2032

## 2013-06-11 NOTE — ED Notes (Signed)
Patient transported to CT 

## 2013-06-17 ENCOUNTER — Ambulatory Visit: Payer: Commercial Indemnity | Admitting: Family Medicine

## 2013-06-29 ENCOUNTER — Ambulatory Visit: Payer: Commercial Indemnity | Admitting: Family Medicine

## 2013-07-07 ENCOUNTER — Ambulatory Visit (INDEPENDENT_AMBULATORY_CARE_PROVIDER_SITE_OTHER): Payer: Commercial Indemnity | Admitting: Family Medicine

## 2013-07-07 ENCOUNTER — Encounter (HOSPITAL_COMMUNITY): Payer: Self-pay | Admitting: Emergency Medicine

## 2013-07-07 ENCOUNTER — Emergency Department (HOSPITAL_COMMUNITY)
Admission: EM | Admit: 2013-07-07 | Discharge: 2013-07-08 | Disposition: A | Discharge status: Home / Self Care | Payer: Commercial Indemnity | Attending: Emergency Medicine | Admitting: Emergency Medicine

## 2013-07-07 VITALS — BP 206/106 | HR 74 | Ht 69.0 in | Wt 168.0 lb

## 2013-07-07 DIAGNOSIS — Z9119 Patient's noncompliance with other medical treatment and regimen: Secondary | ICD-10-CM | POA: Insufficient documentation

## 2013-07-07 DIAGNOSIS — Z79899 Other long term (current) drug therapy: Secondary | ICD-10-CM | POA: Insufficient documentation

## 2013-07-07 DIAGNOSIS — G43009 Migraine without aura, not intractable, without status migrainosus: Secondary | ICD-10-CM

## 2013-07-07 DIAGNOSIS — Z9114 Patient's other noncompliance with medication regimen: Secondary | ICD-10-CM

## 2013-07-07 DIAGNOSIS — Z91199 Patient's noncompliance with other medical treatment and regimen due to unspecified reason: Secondary | ICD-10-CM | POA: Insufficient documentation

## 2013-07-07 DIAGNOSIS — G43909 Migraine, unspecified, not intractable, without status migrainosus: Secondary | ICD-10-CM | POA: Insufficient documentation

## 2013-07-07 DIAGNOSIS — Z720 Tobacco use: Secondary | ICD-10-CM

## 2013-07-07 DIAGNOSIS — F17200 Nicotine dependence, unspecified, uncomplicated: Secondary | ICD-10-CM | POA: Insufficient documentation

## 2013-07-07 DIAGNOSIS — Z888 Allergy status to other drugs, medicaments and biological substances status: Secondary | ICD-10-CM | POA: Insufficient documentation

## 2013-07-07 DIAGNOSIS — R51 Headache: Secondary | ICD-10-CM

## 2013-07-07 DIAGNOSIS — I10 Essential (primary) hypertension: Secondary | ICD-10-CM

## 2013-07-07 DIAGNOSIS — R519 Headache, unspecified: Secondary | ICD-10-CM

## 2013-07-07 DIAGNOSIS — F3289 Other specified depressive episodes: Secondary | ICD-10-CM | POA: Insufficient documentation

## 2013-07-07 DIAGNOSIS — F329 Major depressive disorder, single episode, unspecified: Secondary | ICD-10-CM | POA: Insufficient documentation

## 2013-07-07 DIAGNOSIS — F411 Generalized anxiety disorder: Secondary | ICD-10-CM | POA: Insufficient documentation

## 2013-07-07 MED ORDER — PROCHLORPERAZINE EDISYLATE 5 MG/ML IJ SOLN
10.0000 mg | Freq: Once | INTRAMUSCULAR | Status: AC
  Administered 2013-07-07: 10 mg via INTRAVENOUS
  Filled 2013-07-07: qty 2

## 2013-07-07 MED ORDER — CARVEDILOL 3.125 MG PO TABS: 3.1250 mg | ORAL_TABLET | Freq: Two times a day (BID) | ORAL | Status: DC

## 2013-07-07 MED ORDER — SERTRALINE HCL 100 MG PO TABS: 100.0000 mg | ORAL_TABLET | Freq: Every day | ORAL | Status: DC

## 2013-07-07 MED ORDER — KETOROLAC TROMETHAMINE 30 MG/ML IJ SOLN
30.0000 mg | Freq: Once | INTRAMUSCULAR | Status: AC
  Administered 2013-07-07: 30 mg via INTRAVENOUS
  Filled 2013-07-07: qty 1

## 2013-07-07 MED ORDER — SODIUM CHLORIDE 0.9 % IV BOLUS (SEPSIS)
1000.0000 mL | Freq: Once | INTRAVENOUS | Status: AC
  Administered 2013-07-07: 1000 mL via INTRAVENOUS

## 2013-07-07 MED ORDER — DIPHENHYDRAMINE HCL 50 MG/ML IJ SOLN
25.0000 mg | Freq: Once | INTRAMUSCULAR | Status: AC
  Administered 2013-07-07: 25 mg via INTRAVENOUS
  Filled 2013-07-07: qty 1

## 2013-07-07 NOTE — ED Provider Notes (Signed)
CSN: [id number]     Arrival date & time 07/07/13  1952 History   First MD Initiated Contact with Patient 07/07/13 2207     Chief Complaint  Patient presents with  . Migraine     (Consider location/radiation/quality/duration/timing/severity/associated sxs/prior Treatment) Patient is a 38 y.o. female presenting with migraines.  Migraine Associated symptoms include headaches. Pertinent negatives include no abdominal pain, arthralgias, chest pain, chills, fever, myalgias, nausea, numbness, rash, vomiting or weakness.   Katelyn [name] is a 38 y.o. female presents to the [facility] ED with complaint of migraine headaches. She has a PMH history of hypertension, anemia, and migraine headache. She is seen frequently here for her headaches. This headache was present when she woke up this morning. She describes this  headaches as a throbbing, right, frontal.  She complains of photophobia and phonophobia.  It has been constant throughout the day.  She reports having a  migraines 1-3 times a week.  Headache sxs are unchanged.  She has tried Excedrin migraine with mild relief.  She saw a pcp in her primary care practice who discontinued her betablocker due to intolerance and began her  Verapamil today.  She denies any vision or hearing changes, speech changes, speech difficulty, numbness or tingling anywhere on her body. Denies  N/V, visual changes, stiff neck,  Fever, neck pain, rash, or "thunderclap" onset.  Patient hypertensive in the ED.  Review of her records indicate persistent hypertension within the range of 160/89 - 210/110.  PCP Questions medication non-compliance   Past Medical History  Diagnosis Date  . Hypertension   . Palpitations   . Chest pain   . Anemia   . Anxiety   . Depression   . Migraine    Past Surgical History  Procedure Laterality Date  . Hysteroscopy w/ endometrial ablation    . Tubal ligation    . Abdominal hysterectomy     Family History  Problem Relation Age of Onset  .  Diabetes Mother   . Depression Mother   . Depression Sister    History  Substance Use Topics  . Smoking status: Current Every Day Smoker -- 0.50 packs/day    Types: Cigarettes    Start date: 01/06/1994  . Smokeless tobacco: Never Used  . Alcohol Use: 2.0 oz/week    4 drink(s) per week     Comment: occasional   OB History   Grav Para Term Preterm Abortions TAB SAB Ect Mult Living                 Review of Systems  Constitutional: Negative for fever and chills.  HENT: Negative for trouble swallowing.        Phonophobia   Eyes: Positive for photophobia.  Respiratory: Negative for shortness of breath.   Cardiovascular: Negative for chest pain.  Gastrointestinal: Negative for nausea, vomiting, abdominal pain, diarrhea and constipation.  Genitourinary: Negative for dysuria and hematuria.  Musculoskeletal: Negative for arthralgias and myalgias.  Skin: Negative for rash.  Neurological: Positive for headaches. Negative for dizziness, seizures, facial asymmetry, speech difficulty, weakness, light-headedness and numbness.  Psychiatric/Behavioral: Negative for hallucinations, confusion and agitation. The patient is not nervous/anxious.   All other systems reviewed and are negative.     Allergies  Metoprolol  Home Medications   Prior to Admission medications   Medication Sig Start Date End Date Taking? Authorizing Provider  amLODipine (NORVASC) 10 MG tablet Take 1 tablet (10 mg total) by mouth daily. 06/07/13  Yes Angelica Ran, MD  aspirin-acetaminophen-caffeine (EXCEDRIN MIGRAINE) 250-250-65 MG per tablet Take 1 tablet by mouth every 6 (six) hours as needed for headache.   Yes Historical Provider, MD  lisinopril-hydrochlorothiazide (ZESTORETIC) 20-12.5 MG per tablet Take 1 tablet by mouth daily. 06/05/13  Yes Tatyana A Kirichenko, PA-C  sertraline (ZOLOFT) 100 MG tablet Take 1 tablet (100 mg total) by mouth daily. 07/07/13  Yes Tawanna Sat, MD  carvedilol (COREG) 3.125 MG  tablet Take 1 tablet (3.125 mg total) by mouth 2 (two) times daily with a meal. 07/07/13   Tawanna Sat, MD   BP 152/97  Pulse 65  Temp(Src) 97.7 F (36.5 C) (Oral)  Resp 21  Ht 5\' 9"  (1.753 m)  Wt 168 lb (76.204 kg)  BMI 24.80 kg/m2  SpO2 100% Physical Exam  Nursing note and vitals reviewed. Constitutional: She is oriented to person, place, and time. She appears well-developed and well-nourished. No distress.  HENT:  Head: Normocephalic and atraumatic.  Eyes: Conjunctivae are normal. No scleral icterus.  Neck: Normal range of motion.  Cardiovascular: Normal rate, regular rhythm and normal heart sounds.  Exam reveals no gallop and no friction rub.   No murmur heard. Pulmonary/Chest: Effort normal and breath sounds normal. No respiratory distress.  Abdominal: Soft. Bowel sounds are normal. She exhibits no distension and no mass. There is no tenderness. There is no guarding.  Neurological: She is alert and oriented to person, place, and time. She has normal strength and normal reflexes. No cranial nerve deficit or sensory deficit. She displays a negative Romberg sign. Coordination and gait normal. GCS eye subscore is 4. GCS verbal subscore is 5. GCS motor subscore is 6.  Speech is clear and goal oriented, follows commands Major Cranial nerves without deficit, no facial droop Normal strength in upper and lower extremities bilaterally including dorsiflexion and plantar flexion, strong and equal grip strength Sensation normal to light and sharp touch Moves extremities without ataxia, coordination intact Normal finger to nose and rapid alternating movements Neg romberg, no pronator drift Normal gait Normal heel-shin and balance   Skin: Skin is warm and dry. She is not diaphoretic.  Psychiatric: She has a normal mood and affect.    ED Course  Procedures (including critical care time) Labs Review Labs Reviewed - No data to display  Imaging Review No results found.   EKG  Interpretation None      MDM   Final diagnoses:  Chronic nonintractable headache, unspecified headache type  Essential hypertension  Tobacco abuse  Non compliance w medication regimen    Filed Vitals:   07/08/13 0000 07/08/13 0015 07/08/13 0030 07/08/13 0045  BP: 146/92 158/105 130/88 128/84  Pulse: 64 64 65 63  Temp:      TempSrc:      Resp: 17 20 18 18   Height:      Weight:      SpO2: 100% 100% 100% 100%    Pt HA treated and improved while in ED.  Presentation is like pts typical HA and non concerning for [facility], ICH, Meningitis, or temporal arteritis. Pt is afebrile with no focal neuro deficits, nuchal rigidity, or change in vision. Pt is to follow up with PCP to discuss prophylactic medication. Pt verbalizes understanding and is agreeable with plan to dc.      Margarita Mail, PA-C 07/08/13 [phone]

## 2013-07-07 NOTE — ED Notes (Addendum)
Pt. reports migraine headache onset this morning , denies nausea or blurred vision . Hypertensive at triage .

## 2013-07-07 NOTE — Progress Notes (Signed)
Patient ID: Katelyn [name], female   DOB: [birth year], 38 y.o.   MRN: [id number]   Subjective:    Patient ID: Katelyn [name], female    DOB: [birth year], 38 y.o.   MRN: [id number]  HPI  CC: headache  # Migraine:  3x a week, getting worse. Right side primarily but can be in front both sides  Photo and phonophobia  Lasts most of the day  Excedrin migraine, bc powder, sleep all help somewhat ROS: no changes in vision, hearing, weakness  # Hypertension:  Takes amlodipine, lisinopril-hctz, spironolactone  Only missed 2 doses in past week; took today's meds 1.5 hours before visit  Checks BP at pharmacy "run pretty high" ROS: no CP, SOB, changes in urine color  Review of Systems   See HPI for ROS. Objective:  BP 206/106  Pulse 74  Ht 5\' 9"  (1.753 m)  Wt 168 lb (76.204 kg)  BMI 24.80 kg/m2  General: NAD HEENT: PERRL, EOMI, light aversive. Cardiac: RRR, normal heart sounds, no murmurs. 2+ radial and PT pulses bilaterally Respiratory: CTAB, normal effort Neuro: alert and oriented, no focal deficits. Grip strength 5/5 bilaterally, gait normal.     Assessment & Plan:  See Problem List Documentation

## 2013-07-07 NOTE — Patient Instructions (Signed)
Medications: Amlodipine 10mg  daily Lisinopril-hctz 20-12.5mg  2 tablets daily Coreg 3.125mg  twice a day STOP taking spironolactone  If you develop symptoms like chest pain, shortness of breath, changes in vision, please go to the emergency room immediately   Record when you take your medications every day, and any blood pressures you record (ideally 2 times a day)

## 2013-07-08 NOTE — Discharge Instructions (Signed)
You are having a headache. No specific cause was found today for your headache. It may have been a migraine or other cause of headache. Stress, anxiety, fatigue, and depression are common triggers for headaches. Your headache today does not appear to be life-threatening or require hospitalization, but often the exact cause of headaches is not determined in the emergency department. Therefore, follow-up with your doctor is very important to find out what may have caused your headache, and whether or not you need any further diagnostic testing or treatment. Sometimes headaches can appear benign (not harmful), but then more serious symptoms can develop which should prompt an immediate re-evaluation by your doctor or the emergency department. SEEK MEDICAL ATTENTION IF: You develop possible problems with medications prescribed.  The medications don't resolve your headache, if it recurs , or if you have multiple episodes of vomiting or can't take fluids. You have a change from the usual headache. RETURN IMMEDIATELY IF you develop a sudden, severe headache or confusion, become poorly responsive or faint, develop a fever above 100.1F or problem breathing, have a change in speech, vision, swallowing, or understanding, or develop new weakness, numbness, tingling, incoordination, or have a seizure.   Hypertension Hypertension, commonly called high blood pressure, is when the force of blood pumping through your arteries is too strong. Your arteries are the blood vessels that carry blood from your heart throughout your body. A blood pressure reading consists of a higher number over a lower number, such as 110/72. The higher number (systolic) is the pressure inside your arteries when your heart pumps. The lower number (diastolic) is the pressure inside your arteries when your heart relaxes. Ideally you want your blood pressure below 120/80. Hypertension forces your heart to work harder to pump blood. Your arteries may  become narrow or stiff. Having hypertension puts you at risk for heart disease, stroke, and other problems.  RISK FACTORS Some risk factors for high blood pressure are controllable. Others are not.  Risk factors you cannot control include:   Race. You may be at higher risk if you are African American.  Age. Risk increases with age.  Gender. Men are at higher risk than women before age 42 years. After age 40, women are at higher risk than men. Risk factors you can control include:  Not getting enough exercise or physical activity.  Being overweight.  Getting too much fat, sugar, calories, or salt in your diet.  Drinking too much alcohol. SIGNS AND SYMPTOMS Hypertension does not usually cause signs or symptoms. Extremely high blood pressure (hypertensive crisis) may cause headache, anxiety, shortness of breath, and nosebleed. DIAGNOSIS  To check if you have hypertension, your health care provider will measure your blood pressure while you are seated, with your arm held at the level of your heart. It should be measured at least twice using the same arm. Certain conditions can cause a difference in blood pressure between your right and left arms. A blood pressure reading that is higher than normal on one occasion does not mean that you need treatment. If one blood pressure reading is high, ask your health care provider about having it checked again. TREATMENT  Treating high blood pressure includes making lifestyle changes and possibly taking medication. Living a healthy lifestyle can help lower high blood pressure. You may need to change some of your habits. Lifestyle changes may include:  Following the DASH diet. This diet is high in fruits, vegetables, and whole grains. It is low in salt, red meat,  and added sugars.  Getting at least 2 1/2 hours of brisk physical activity every week.  Losing weight if necessary.  Not smoking.  Limiting alcoholic beverages.  Learning ways to reduce  stress. If lifestyle changes are not enough to get your blood pressure under control, your health care provider may prescribe medicine. You may need to take more than one. Work closely with your health care provider to understand the risks and benefits. HOME CARE INSTRUCTIONS  Have your blood pressure rechecked as directed by your health care provider.   Only take medicine as directed by your health care provider. Follow the directions carefully. Blood pressure medicines must be taken as prescribed. The medicine does not work as well when you skip doses. Skipping doses also puts you at risk for problems.   Do not smoke.   Monitor your blood pressure at home as directed by your health care provider. SEEK MEDICAL CARE IF:   You think you are having a reaction to medicines taken.  You have recurrent headaches or feel dizzy.  You have swelling in your ankles.  You have trouble with your vision. SEEK IMMEDIATE MEDICAL CARE IF:  You develop a severe headache or confusion.  You have unusual weakness, numbness, or feel faint.  You have severe chest or abdominal pain.  You vomit repeatedly.  You have trouble breathing. MAKE SURE YOU:   Understand these instructions.  Will watch your condition.  Will get help right away if you are not doing well or get worse. Document Released: 12/23/2004 Document Revised: 12/28/2012 Document Reviewed: 10/15/2012 [facility] Patient Information 2015 [place], [place]. This information is not intended to replace advice given to you by your health care provider. Make sure you discuss any questions you have with your health care provider.  Nicotine Addiction Nicotine can act as both a stimulant (excites/activates) and a sedative (calms/quiets). Immediately after exposure to nicotine, there is a "kick" caused in part by the drug's stimulation of the adrenal glands and resulting discharge of adrenaline (epinephrine). The rush of adrenaline stimulates the  body and causes a sudden release of sugar. This means that smokers are always slightly hyperglycemic. Hyperglycemic means that the blood sugar is high, just like in diabetics. Nicotine also decreases the amount of insulin which helps control sugar levels in the body. There is an increase in blood pressure, breathing, and the rate of heart beats.  In addition, nicotine indirectly causes a release of dopamine in the brain that controls pleasure and motivation. A similar reaction is seen with other drugs of abuse, such as cocaine and heroin. This dopamine release is thought to cause the pleasurable sensations when smoking. In some different cases, nicotine can also create a calming effect, depending on sensitivity of the smoker's nervous system and the dose of nicotine taken. WHAT HAPPENS WHEN NICOTINE IS TAKEN FOR LONG PERIODS OF TIME?  Long-term use of nicotine results in addiction. It is difficult to stop.  Repeated use of nicotine creates tolerance. Higher doses of nicotine are needed to get the "kick." When nicotine use is stopped, withdrawal may last a month or more. Withdrawal may begin within a few hours after the last cigarette. Symptoms peak within the first few days and may lessen within a few weeks. For some people, however, symptoms may last for months or longer. Withdrawal symptoms include:   Irritability.  Craving.  Learning and attention deficits.  Sleep disturbances.  Increased appetite. Craving for tobacco may last for 6 months or longer. Many behaviors  done while using nicotine can also play a part in the severity of withdrawal symptoms. For some people, the feel, smell, and sight of a cigarette and the ritual of obtaining, handling, lighting, and smoking the cigarette are closely linked with the pleasure of smoking. When stopped, they also miss the related behaviors which make the withdrawal or craving worse. While nicotine gum and patches may lessen the drug aspects of withdrawal,  cravings often persist. WHAT ARE THE MEDICAL CONSEQUENCES OF NICOTINE USE?  Nicotine addiction accounts for one-third of all cancers. The top cancer caused by tobacco is lung cancer. Lung cancer is the number one cancer killer of both men and women.  Smoking is also associated with cancers of the:  Mouth.  Pharynx.  Larynx.  Esophagus.  Stomach.  Pancreas.  Cervix.  Kidney.  Ureter.  Bladder.  Smoking also causes lung diseases such as lasting (chronic) bronchitis and emphysema.  It worsens asthma in adults and children.  Smoking increases the risk of heart disease, including:  Stroke.  Heart attack.  Vascular disease.  Aneurysm.  Passive or secondary smoke can also increase medical risks including:  Asthma in children.  Sudden Infant Death Syndrome (SIDS).  Additionally, dropped cigarettes are the leading cause of residential fire fatalities.  Nicotine poisoning has been reported from accidental ingestion of tobacco products by children and pets. Death usually results in a few minutes from respiratory failure (when a person stops breathing) caused by paralysis. TREATMENT   Medication. Nicotine replacement medicines such as nicotine gum and the patch are used to stop smoking. These medicines gradually lower the dosage of nicotine in the body. These medicines do not contain the carbon monoxide and other toxins found in tobacco smoke.  Hypnotherapy.  Relaxation therapy.  Nicotine Anonymous (a 12-step support program). Find times and locations in your local yellow pages. Document Released: 08/29/2003 Document Revised: 03/17/2011 Document Reviewed: 01/20/2007 [facility] Patient Information 2015 [place], [place]. This information is not intended to replace advice given to you by your health care provider. Make sure you discuss any questions you have with your health care provider.  Migraine Headache A migraine headache is an intense, throbbing pain on one or both  sides of your head. A migraine can last for 30 minutes to several hours. CAUSES  The exact cause of a migraine headache is not always known. However, a migraine may be caused when nerves in the brain become irritated and release chemicals that cause inflammation. This causes pain. Certain things may also trigger migraines, such as:  Alcohol.  Smoking.  Stress.  Menstruation.  Aged cheeses.  Foods or drinks that contain nitrates, glutamate, aspartame, or tyramine.  Lack of sleep.  Chocolate.  Caffeine.  Hunger.  Physical exertion.  Fatigue.  Medicines used to treat chest pain (nitroglycerine), birth control pills, estrogen, and some blood pressure medicines. SIGNS AND SYMPTOMS  Pain on one or both sides of your head.  Pulsating or throbbing pain.  Severe pain that prevents daily activities.  Pain that is aggravated by any physical activity.  Nausea, vomiting, or both.  Dizziness.  Pain with exposure to bright lights, loud noises, or activity.  General sensitivity to bright lights, loud noises, or smells. Before you get a migraine, you may get warning signs that a migraine is coming (aura). An aura may include:  Seeing flashing lights.  Seeing bright spots, halos, or zig-zag lines.  Having tunnel vision or blurred vision.  Having feelings of numbness or tingling.  Having trouble talking.  Having muscle weakness. DIAGNOSIS  A migraine headache is often diagnosed based on:  Symptoms.  Physical exam.  A CT scan or MRI of your head. These imaging tests cannot diagnose migraines, but they can help rule out other causes of headaches. TREATMENT Medicines may be given for pain and nausea. Medicines can also be given to help prevent recurrent migraines.  HOME CARE INSTRUCTIONS  Only take over-the-counter or prescription medicines for pain or discomfort as directed by your health care provider. The use of long-term narcotics is not recommended.  Lie down  in a dark, quiet room when you have a migraine.  Keep a journal to find out what may trigger your migraine headaches. For example, write down:  What you eat and drink.  How much sleep you get.  Any change to your diet or medicines.  Limit alcohol consumption.  Quit smoking if you smoke.  Get 7-9 hours of sleep, or as recommended by your health care provider.  Limit stress.  Keep lights dim if bright lights bother you and make your migraines worse. SEEK IMMEDIATE MEDICAL CARE IF:   Your migraine becomes severe.  You have a fever.  You have a stiff neck.  You have vision loss.  You have muscular weakness or loss of muscle control.  You start losing your balance or have trouble walking.  You feel faint or pass out.  You have severe symptoms that are different from your first symptoms. MAKE SURE YOU:   Understand these instructions.  Will watch your condition.  Will get help right away if you are not doing well or get worse. Document Released: 12/23/2004 Document Revised: 10/13/2012 Document Reviewed: 08/30/2012 [facility] Patient Information 2015 [place], [place]. This information is not intended to replace advice given to you by your health care provider. Make sure you discuss any questions you have with your health care provider.

## 2013-07-09 NOTE — Assessment & Plan Note (Signed)
Unclear if this is from high blood pressures or actual migraine. P: discuss starting prophylactic medications at next visit.

## 2013-07-09 NOTE — Assessment & Plan Note (Addendum)
A: long history early hypertension (age 38), on multiple medications in past, reports some intolerance of metoprolol (made her feel bad). No current evidence of end organ damage, all creatinine in past in normal range P: Discontinue spironolactone, start low dose coreg 3.125mg  BID; if she does not tolerate will go to hydralazine (did not start today because of more frequent dosing). F/u next week.

## 2013-07-16 NOTE — ED Provider Notes (Signed)
Medical screening examination/treatment/procedure(s) were performed by non-physician practitioner and as supervising physician I was immediately available for consultation/collaboration.   EKG Interpretation None        Fahmida Jurich, MD 07/16/13 0721 

## 2013-07-18 ENCOUNTER — Ambulatory Visit: Payer: Commercial Indemnity | Admitting: Family Medicine

## 2013-08-19 ENCOUNTER — Ambulatory Visit (INDEPENDENT_AMBULATORY_CARE_PROVIDER_SITE_OTHER): Payer: Commercial Indemnity | Admitting: Family Medicine

## 2013-08-19 VITALS — BP 174/125 | HR 74 | Temp 98.0°F | Wt 167.0 lb

## 2013-08-19 DIAGNOSIS — G43909 Migraine, unspecified, not intractable, without status migrainosus: Secondary | ICD-10-CM

## 2013-08-19 DIAGNOSIS — G43009 Migraine without aura, not intractable, without status migrainosus: Secondary | ICD-10-CM

## 2013-08-19 MED ORDER — DIPHENHYDRAMINE HCL 50 MG/ML IJ SOLN
25.0000 mg | Freq: Once | INTRAMUSCULAR | Status: AC
  Administered 2013-08-19: 25 mg via INTRAMUSCULAR

## 2013-08-19 MED ORDER — KETOROLAC TROMETHAMINE 30 MG/ML IJ SOLN
30.0000 mg | Freq: Once | INTRAMUSCULAR | Status: AC
  Administered 2013-08-19: 30 mg via INTRAMUSCULAR

## 2013-08-19 MED ORDER — DEXAMETHASONE SODIUM PHOSPHATE 10 MG/ML IJ SOLN
10.0000 mg | Freq: Once | INTRAMUSCULAR | Status: AC
  Administered 2013-08-19: 10 mg via INTRAMUSCULAR

## 2013-08-19 NOTE — Patient Instructions (Addendum)
It was nice to meet you today!  I'm sorry you aren't feeling well. We gave you a shot for your migraine. Follow up if it is not improving with this medicine. Do NOT drive today while you have gotten these medicines. Do not take any other aleve, ibuprofen, excedrin, etc today.  Please see Dr. Lonny Prude in the next 1-2 weeks to meet him and discuss ways of preventing migraines.  Be well, Dr. Ardelia Mems   Migraine Headache A migraine headache is an intense, throbbing pain on one or both sides of your head. A migraine can last for 30 minutes to several hours. CAUSES  The exact cause of a migraine headache is not always known. However, a migraine may be caused when nerves in the brain become irritated and release chemicals that cause inflammation. This causes pain. Certain things may also trigger migraines, such as:  Alcohol.  Smoking.  Stress.  Menstruation.  Aged cheeses.  Foods or drinks that contain nitrates, glutamate, aspartame, or tyramine.  Lack of sleep.  Chocolate.  Caffeine.  Hunger.  Physical exertion.  Fatigue.  Medicines used to treat chest pain (nitroglycerine), birth control pills, estrogen, and some blood pressure medicines. SIGNS AND SYMPTOMS  Pain on one or both sides of your head.  Pulsating or throbbing pain.  Severe pain that prevents daily activities.  Pain that is aggravated by any physical activity.  Nausea, vomiting, or both.  Dizziness.  Pain with exposure to bright lights, loud noises, or activity.  General sensitivity to bright lights, loud noises, or smells. Before you get a migraine, you may get warning signs that a migraine is coming (aura). An aura may include:  Seeing flashing lights.  Seeing bright spots, halos, or zigzag lines.  Having tunnel vision or blurred vision.  Having feelings of numbness or tingling.  Having trouble talking.  Having muscle weakness. DIAGNOSIS  A migraine headache is often diagnosed based  on:  Symptoms.  Physical exam.  A CT scan or MRI of your head. These imaging tests cannot diagnose migraines, but they can help rule out other causes of headaches. TREATMENT Medicines may be given for pain and nausea. Medicines can also be given to help prevent recurrent migraines.  HOME CARE INSTRUCTIONS  Only take over-the-counter or prescription medicines for pain or discomfort as directed by your health care provider. The use of long-term narcotics is not recommended.  Lie down in a dark, quiet room when you have a migraine.  Keep a journal to find out what may trigger your migraine headaches. For example, write down:  What you eat and drink.  How much sleep you get.  Any change to your diet or medicines.  Limit alcohol consumption.  Quit smoking if you smoke.  Get 7-9 hours of sleep, or as recommended by your health care provider.  Limit stress.  Keep lights dim if bright lights bother you and make your migraines worse. SEEK IMMEDIATE MEDICAL CARE IF:   Your migraine becomes severe.  You have a fever.  You have a stiff neck.  You have vision loss.  You have muscular weakness or loss of muscle control.  You start losing your balance or have trouble walking.  You feel faint or pass out.  You have severe symptoms that are different from your first symptoms. MAKE SURE YOU:   Understand these instructions.  Will watch your condition.  Will get help right away if you are not doing well or get worse. Document Released: 12/23/2004 Document Revised: 05/09/2013  Document Reviewed: 08/30/2012 [facility] Patient Information 2015 [place], [place]. This information is not intended to replace advice given to you by your health care provider. Make sure you discuss any questions you have with your health care provider.

## 2013-08-19 NOTE — Assessment & Plan Note (Signed)
Consistent with pt's typical migraine. No red flags. Normal neuro exam. Will give decadron 10mg  IM, benadryl 25mg  IM, and toradol 30mg  IM here today. Pt's mother is able to drive her home. Counseled on reasons to return. Will have her f/u with PCP in 1-2 weeks to discuss migraine prophylaxis options.

## 2013-08-19 NOTE — Progress Notes (Signed)
Patient ID: Katelyn [name], female   DOB: 07/26/1975, 38 y.o.   MRN: [id number]  HPI:  Pt presents for a same day appointment to discuss headache.  Onset: yesterday   Location: top/crown of head  Quality: sharp pounding pain, occasionally travels to neck Frequency: states hasn't had headache in a while  Precipitating factors: none  Prior treatment: excedrin migraine & bc powder  Associated Symptoms Nausea/vomiting: yes, felt nauseated but no vomiting  Photophobia/phonophobia: yes to both Tearing of eyes: yes  Sinus pain/pressure: unknown, was sneezing earlier  Family hx migraine: no  Personal stressors: yes Relation to menstrual cycle: n/a doesn't get periods (ablation)  Red Flags Fever: no  Neck pain/stiffness: no  Vision/speech/swallow/hearing difficulty: no  Focal weakness/numbness: no  Altered mental status: no  Trauma: no  New type of headache: no  Anticoagulant use: no  H/o cancer/HIV/Pregnancy: no   ROS: See HPI  [place]: hx HTN, panic disorder, GAD  PHYSICAL EXAM: BP 174/125  Pulse 74  Temp(Src) 98 F (36.7 C) (Oral)  Wt 167 lb (75.751 kg) Gen: NAD HEENT: NCAT, full ROM of neck. Heart: RRR Lungs: CTAB Neuro: cranial nerves II-XII tested and intact. Full strength in all extremities.  Normal FNF. Normal heel shin coordination bilaterally. Negative romberg. Gait normal. Sensation intact to light touch in all extremities. 2+ patellar reflexes bilaterally. Speech normal. Alert and oriented x 3.  ASSESSMENT/PLAN:  Migraine headache without aura Consistent with pt's typical migraine. No red flags. Normal neuro exam. Will give decadron 10mg  IM, benadryl 25mg  IM, and toradol 30mg  IM here today. Pt's mother is able to drive her home. Counseled on reasons to return. Will have her f/u with PCP in 1-2 weeks to discuss migraine prophylaxis options.   FOLLOW UP: F/u in 1-2 weeks for migraine prophylaxis discussion with PCP.  [place]. Ardelia Mems, [place]

## 2013-09-01 ENCOUNTER — Encounter: Payer: Self-pay | Admitting: Family Medicine

## 2013-09-01 ENCOUNTER — Ambulatory Visit (INDEPENDENT_AMBULATORY_CARE_PROVIDER_SITE_OTHER): Payer: Commercial Indemnity | Admitting: Family Medicine

## 2013-09-01 VITALS — BP 147/97 | HR 69 | Temp 98.5°F | Ht 69.0 in | Wt 168.0 lb

## 2013-09-01 DIAGNOSIS — G43009 Migraine without aura, not intractable, without status migrainosus: Secondary | ICD-10-CM

## 2013-09-01 NOTE — Assessment & Plan Note (Addendum)
Will treat with benadryl 25mg  IM, decadron 10mg  IM and Toradol 30mg  IM. She has a ride home. Will discuss further migraine prophylaxis at next visit. Will not prescribe Topamax because of severe hypertension. May need to refer to neurology if cannot find reasonable prophylaxis regimen.

## 2013-09-01 NOTE — Progress Notes (Signed)
    Subjective   Katelyn [name] is a 38 y.o. female that presents for a same day visit  1. Migraine: Typical presentation. Started this morning. No aura. Top of her head. Throbbing. Does not radiate. Photosensitivity and noise worsens. Took some Excedrin migraine which has not helped. No nausea or vomiting. No weakness.  History  Substance Use Topics  . Smoking status: Current Every Day Smoker -- 0.50 packs/day    Types: Cigarettes    Start date: 01/06/1994  . Smokeless tobacco: Never Used  . Alcohol Use: 2.0 oz/week    4 drink(s) per week     Comment: occasional    ROS Per HPI  Objective   BP 147/97  Pulse 69  Temp(Src) 98.5 F (36.9 C) (Oral)  Ht 5\' 9"  (1.753 m)  Wt 168 lb (76.204 kg)  BMI 24.80 kg/m2  General: laying on table in the dark with eyes covered. In no acute distress HEENT: PERRL Neuro: No focal deficit  Assessment and Plan   Please refer to problem based charting of assessment and plan

## 2013-09-01 NOTE — Patient Instructions (Addendum)
Thank you for coming to see me today. It was a pleasure. Today we talked about:   Migraine: I gave you some medication to help with your migraine today. Please see me in 1-2 weeks to discuss your chronic medical issues and to discuss migraine prevention.  If you have any questions or concerns, please do not hesitate to call the office at [phone].  Sincerely,  Cordelia Poche, MD   Migraine Headache A migraine headache is very bad, throbbing pain on one or both sides of your head. Talk to your doctor about what things may bring on (trigger) your migraine headaches. HOME CARE  Only take medicines as told by your doctor.  Lie down in a dark, quiet room when you have a migraine.  Keep a journal to find out if certain things bring on migraine headaches. For example, write down:  What you eat and drink.  How much sleep you get.  Any change to your diet or medicines.  Lessen how much alcohol you drink.  Quit smoking if you smoke.  Get enough sleep.  Lessen any stress in your life.  Keep lights dim if bright lights bother you or make your migraines worse. GET HELP RIGHT AWAY IF:   Your migraine becomes really bad.  You have a fever.  You have a stiff neck.  You have trouble seeing.  Your muscles are weak, or you lose muscle control.  You lose your balance or have trouble walking.  You feel like you will pass out (faint), or you pass out.  You have really bad symptoms that are different than your first symptoms. MAKE SURE YOU:   Understand these instructions.  Will watch your condition.  Will get help right away if you are not doing well or get worse. Document Released: 10/02/2007 Document Revised: 03/17/2011 Document Reviewed: 08/30/2012 [facility] Patient Information 2015 [place], [place]. This information is not intended to replace advice given to you by your health care provider. Make sure you discuss any questions you have with your health care provider.

## 2013-09-02 ENCOUNTER — Telehealth: Payer: Self-pay | Admitting: Family Medicine

## 2013-09-02 MED ORDER — KETOROLAC TROMETHAMINE 30 MG/ML IJ SOLN
30.0000 mg | Freq: Once | INTRAMUSCULAR | Status: AC
  Administered 2013-09-01: 30 mg via INTRAMUSCULAR

## 2013-09-02 MED ORDER — DIPHENHYDRAMINE HCL 50 MG/ML IJ SOLN
25.0000 mg | Freq: Once | INTRAMUSCULAR | Status: AC
  Administered 2013-09-01: 25 mg via INTRAMUSCULAR

## 2013-09-02 MED ORDER — DEXAMETHASONE SODIUM PHOSPHATE 10 MG/ML IJ SOLN
10.0000 mg | Freq: Once | INTRAMUSCULAR | Status: AC
Start: 2013-09-02 — End: 2013-09-01
  Administered 2013-09-01: 10 mg via INTRAMUSCULAR

## 2013-09-02 NOTE — Telephone Encounter (Signed)
Patient informed that letter will be placed upfront for pick up.Katelyn [name], Katelyn [name]

## 2013-09-02 NOTE — Telephone Encounter (Signed)
Pt need note for being out of work today due to migraine she had.  Would like to have before she return on Monday.

## 2013-09-02 NOTE — Telephone Encounter (Signed)
Pt called and has decided that she would like to take the migraine medication that she talked about with Dr. Lonny Prude on 8/27. She also did not go into work today and would need a new letter dstaing that she can return to work on 09/05/13. Please call in the medication to her pharmacy on file and let her know when she can pick up her new letter. jw

## 2013-09-02 NOTE — Addendum Note (Signed)
Addended by: Corinna Capra on: 09/02/2013 01:41 PM   Modules accepted: Orders

## 2013-09-16 ENCOUNTER — Ambulatory Visit: Payer: Commercial Indemnity | Admitting: Family Medicine

## 2013-09-30 ENCOUNTER — Ambulatory Visit: Payer: Commercial Indemnity | Admitting: Family Medicine

## 2013-10-14 ENCOUNTER — Other Ambulatory Visit: Payer: Self-pay | Admitting: *Deleted

## 2013-10-14 MED ORDER — LISINOPRIL-HYDROCHLOROTHIAZIDE 20-12.5 MG PO TABS: 1.0000 | ORAL_TABLET | Freq: Every day | ORAL | Status: DC

## 2013-11-02 ENCOUNTER — Other Ambulatory Visit: Payer: Self-pay | Admitting: Family Medicine

## 2013-11-02 ENCOUNTER — Other Ambulatory Visit: Payer: Self-pay | Admitting: *Deleted

## 2013-11-02 NOTE — Telephone Encounter (Signed)
Spoke with patient and informed her that zoloft was sent in by Dr. Lonny Prude with 5 refills

## 2013-11-03 MED ORDER — LISINOPRIL-HYDROCHLOROTHIAZIDE 20-12.5 MG PO TABS: 1.0000 | ORAL_TABLET | Freq: Every day | ORAL | Status: DC

## 2014-01-30 ENCOUNTER — Ambulatory Visit (INDEPENDENT_AMBULATORY_CARE_PROVIDER_SITE_OTHER): Payer: Commercial Indemnity | Admitting: Family Medicine

## 2014-01-30 ENCOUNTER — Encounter: Payer: Self-pay | Admitting: Family Medicine

## 2014-01-30 VITALS — BP 150/99 | HR 75 | Temp 98.4°F | Wt 184.1 lb

## 2014-01-30 DIAGNOSIS — R7989 Other specified abnormal findings of blood chemistry: Secondary | ICD-10-CM

## 2014-01-30 DIAGNOSIS — I10 Essential (primary) hypertension: Secondary | ICD-10-CM

## 2014-01-30 DIAGNOSIS — R945 Abnormal results of liver function studies: Secondary | ICD-10-CM

## 2014-01-30 DIAGNOSIS — R0789 Other chest pain: Secondary | ICD-10-CM

## 2014-01-30 DIAGNOSIS — F17200 Nicotine dependence, unspecified, uncomplicated: Secondary | ICD-10-CM

## 2014-01-30 DIAGNOSIS — E663 Overweight: Secondary | ICD-10-CM

## 2014-01-30 DIAGNOSIS — Z23 Encounter for immunization: Secondary | ICD-10-CM

## 2014-01-30 DIAGNOSIS — Z720 Tobacco use: Secondary | ICD-10-CM

## 2014-01-30 LAB — COMPREHENSIVE METABOLIC PANEL
ALBUMIN: 4.5 g/dL (ref 3.5–5.2)
ALT: 25 U/L (ref 0–35)
AST: 20 U/L (ref 0–37)
Alkaline Phosphatase: 75 U/L (ref 39–117)
BUN: 12 mg/dL (ref 6–23)
CO2: 23 mEq/L (ref 19–32)
Calcium: 9.4 mg/dL (ref 8.4–10.5)
Chloride: 105 mEq/L (ref 96–112)
Creat: 0.91 mg/dL (ref 0.50–1.10)
Glucose, Bld: 84 mg/dL (ref 70–99)
POTASSIUM: 3.9 meq/L (ref 3.5–5.3)
Sodium: 140 mEq/L (ref 135–145)
Total Bilirubin: 0.3 mg/dL (ref 0.2–1.2)
Total Protein: 7.4 g/dL (ref 6.0–8.3)

## 2014-01-30 LAB — LIPID PANEL
CHOLESTEROL: 248 mg/dL — AB (ref 0–200)
HDL: 47 mg/dL (ref >39)
LDL CALC: 134 mg/dL — AB (ref 0–99)
Total CHOL/HDL Ratio: 5.3 Ratio
Triglycerides: 336 mg/dL — ABNORMAL HIGH (ref <150)
VLDL: 67 mg/dL — AB (ref 0–40)

## 2014-01-30 LAB — POCT GLYCOSYLATED HEMOGLOBIN (HGB A1C): HEMOGLOBIN A1C: 5.2

## 2014-01-30 NOTE — Assessment & Plan Note (Signed)
Discussed smoking cessation. Pt is contemplational. <1/2 PPD. - Encouraged. Discussed quit line.

## 2014-01-30 NOTE — Assessment & Plan Note (Signed)
Most likely musculoskeletal given increased lifting, also possibly anxiety-related/panic attack. No leg swelling or tachycardia to raise concern about VTE. HTN and smoking hx make cardiovascular a concern but story is atypical. No cough and not related to food. - Risk stratification with lipids and A1c. - Discussed smoking cessation. Provided quit line number. - Discussed BP control (see HTN problem). - Precepted with Dr McDiarmid who agrees with plan. Return precautions reviewed.

## 2014-01-30 NOTE — Assessment & Plan Note (Signed)
Last checked 10/2012 and normal.  - Checking today

## 2014-01-30 NOTE — Patient Instructions (Signed)
This is unlikely to be related to your heart.  However, we will get labs today to evaluate risk for diabetes or cholesterol and I will call if any are NOT normal. Keep working on quitting smoking. Use 1-800-QUIT-NOW for support. Make an appointment with Dr Valentina Lucks or Dr Lonny Prude when you are ready to quit. Check blood pressure at home and call in 1 week with three values. Be sure to take medications daily for best control.  If you have worsened chest pain, trouble breathing, leg swelling, or other concerns, seek immediate care.  Best,  Hilton Sinclair, MD

## 2014-01-30 NOTE — Assessment & Plan Note (Addendum)
BP not well controlled for age. Long hx of HTN. Workup has been negative including normal renin, aldosterone, and adrenal CT. 2014 24-hr monitoring with average BP 129/85. May be related to her anxiety. Also with some med noncomplaince entire weekend, suspect occurs frequently. - Urged to take medications daily. - Write down and call with 3 BP values at home. - CMET today. - Discussed smoking cessation.

## 2014-01-30 NOTE — Progress Notes (Signed)
Patient ID: Katelyn [name], female   DOB: April 01, 1975, 39 y.o.   MRN: [id number] Subjective:   CC: Chest pain, hypertension  HPI:   Chest pain Reports chest pain for 3-4 seconds while standing at computer around 2pm today at work, which scared her. Pain was severe, sharp like somebody hit her in mid-chest, and mid-chest/substernal, with random onset.  Immediately decreased to 6/10 and pt sat down, and pain continued decreasing. She reports she had been lifting lots all day.  Pain did not radiate. Denies associated diaphoresis, nausea, dizziness, or shortness of breath. She did have some lightheadedness intermittently all day but no syncope. Had eaten small breakfast, lunch, and had drank well throughout the day. Denied worsened pain with food, palpation, or movement.  Pain is currently gone and pt feels back to normal. This has not occurred before like this. Active usually and does not have any chest pain. No rash or skin change. No fevers, chills, leg swelling, or cough. No h/o clot.  FH: no cardiovascular issues she knows of.  Hypertension BP elevated today. On 4 BP meds (norvasc, coreg, lisinopril-HCTZ) which she missed this past weekend due to just forgetting. Checks BP at home - Cannot recall. Been a while since checked. Smokes <1/2 PPD. Trying to quit. Chest pain today (see above) but denies other incidences, dyspnea, leg swelling, or syncope. Had some lightheadedness this morning.   Review of Systems - Per HPI.   PMH - panic disorder, cigarette smoker, HTN, GAD, elevated LFTs, migraines Smoking status: Current every day smoker <1/2 PPD    Objective:  Physical Exam BP 150/99 mmHg  Pulse 75  Temp(Src) 98.4 F (36.9 C)  Wt 184 lb 1.6 oz (83.507 kg) GEN: NAD CV: RRR, no m/r/g, 2+ B radial pulses, no JVD PULM: CTAB, normal effort EXTR: No LE edema or calf tenderness CHEST: No reproducible chest wall tenderness     Assessment:     Katelyn [name] is a 39 y.o. female  here for chest pain episode today.    Plan:     # See problem list and after visit summary for problem-specific plans.   # Health Maintenance: Flu shot  Follow-up: Follow up with BP values called in in 2 weeks, then 1 month to f/u HTN.   Hilton Sinclair, MD [place]

## 2014-02-02 ENCOUNTER — Telehealth: Payer: Self-pay | Admitting: Family Medicine

## 2014-02-02 DIAGNOSIS — E785 Hyperlipidemia, unspecified: Secondary | ICD-10-CM

## 2014-02-02 NOTE — Telephone Encounter (Signed)
Please let Katelyn [name] know that her labs came back and looked normal (A1c and CMET) except her lipid panel which together with her age, blood pressure, and smoking status, put her at a 12% 10-year risk of having a stroke or heart attack. We can decrease this risk with a cholesterol medication that I can call in if she is amenable. Side effects are rare (muscle aches). We would check her cholesterol again in 1 year. She should also keep working on quitting smoking.  [facility], please call pt and let me know if patient would like cholesterol medication (crestor or lipitor) called in. Thanks!  Hilton Sinclair, MD

## 2014-02-03 NOTE — Telephone Encounter (Signed)
LMOVM for pt to return call .Navdeep Halt Dawn  

## 2014-02-03 NOTE — Telephone Encounter (Signed)
Pt informed and agreeable to starting medication.  Will forward to MD Alexys Lobello, Salome Spotted

## 2014-02-05 MED ORDER — ATORVASTATIN CALCIUM 40 MG PO TABS: 40.0000 mg | ORAL_TABLET | Freq: Every day | ORAL | Status: DC

## 2014-02-05 NOTE — Telephone Encounter (Signed)
Sending in atorvastatin 40mg  daily. Please let her know, If there is another more affordable high intensity statin option (crestor/rosouvastatin), I would be happy to send that instead. If she becomes pregnant, she should stop this medication.  Hilton Sinclair, MD

## 2014-02-05 NOTE — Addendum Note (Signed)
Addended by: Conni Slipper T on: 02/05/2014 02:57 PM   Modules accepted: Orders

## 2014-02-06 NOTE — Telephone Encounter (Signed)
LMOVM for pt to return call .Eyvette Cordon Dawn  

## 2014-02-07 NOTE — Telephone Encounter (Signed)
LMOVM for pt to return call .Thersa Mohiuddin Dawn  

## 2014-02-08 ENCOUNTER — Encounter: Payer: Self-pay | Admitting: Family Medicine

## 2014-02-08 NOTE — Telephone Encounter (Signed)
I will write a letter.  Hilton Sinclair, MD

## 2014-02-08 NOTE — Telephone Encounter (Signed)
LMOVM for pt to return call AGAIN.  Will forward to MD to maybe write letter. [place], Katelyn [name]

## 2014-06-22 ENCOUNTER — Ambulatory Visit (INDEPENDENT_AMBULATORY_CARE_PROVIDER_SITE_OTHER): Payer: Self-pay | Admitting: Family Medicine

## 2014-06-22 ENCOUNTER — Other Ambulatory Visit (HOSPITAL_COMMUNITY)
Admission: RE | Admit: 2014-06-22 | Discharge: 2014-06-22 | Disposition: A | Discharge status: Home / Self Care | Payer: Commercial Indemnity | Source: Ambulatory Visit | Attending: Family Medicine | Admitting: Family Medicine

## 2014-06-22 VITALS — BP 204/126 | HR 83 | Temp 98.2°F | Ht 69.0 in | Wt 178.4 lb

## 2014-06-22 DIAGNOSIS — N912 Amenorrhea, unspecified: Secondary | ICD-10-CM

## 2014-06-22 DIAGNOSIS — Z113 Encounter for screening for infections with a predominantly sexual mode of transmission: Secondary | ICD-10-CM

## 2014-06-22 DIAGNOSIS — Z20828 Contact with and (suspected) exposure to other viral communicable diseases: Secondary | ICD-10-CM

## 2014-06-22 DIAGNOSIS — N898 Other specified noninflammatory disorders of vagina: Secondary | ICD-10-CM

## 2014-06-22 DIAGNOSIS — R1031 Right lower quadrant pain: Secondary | ICD-10-CM

## 2014-06-22 DIAGNOSIS — Z202 Contact with and (suspected) exposure to infections with a predominantly sexual mode of transmission: Secondary | ICD-10-CM

## 2014-06-22 DIAGNOSIS — I160 Hypertensive urgency: Secondary | ICD-10-CM

## 2014-06-22 DIAGNOSIS — I10 Essential (primary) hypertension: Secondary | ICD-10-CM

## 2014-06-22 DIAGNOSIS — R109 Unspecified abdominal pain: Secondary | ICD-10-CM

## 2014-06-22 LAB — POCT WET PREP (WET MOUNT): CLUE CELLS WET PREP WHIFF POC: POSITIVE

## 2014-06-22 LAB — POCT URINE PREGNANCY: Preg Test, Ur: NEGATIVE

## 2014-06-22 NOTE — Patient Instructions (Signed)
Taking your BP medication is very important to avoid a stroke or heart attack. Your BP is very high today and there has been a sequence of high BPs. This is not healthy for you long term. Please go home and take your BP medication. Come back tomorrow for your appointment so we can keep a close eye on your BP. If you have chest pain, shortness of breath, difficulty seeing, changes in urination, or other concerns, go to the Emergency Room.  For your vaginal discharge and abdominal pain, we are doing labwork and I will call if NOT normal. In the meantime, always use protection with sex and stay hydrated. If you develop any pain peeing, severe abdominal pain where you cannot walk, fevers, or other concerns, seek immediate care.  Best,  Hilton Sinclair, MD

## 2014-06-22 NOTE — Progress Notes (Signed)
Patient ID: Katelyn [name], female   DOB: 1975/12/23, 39 y.o.   MRN: [id number] Subjective:   CC: Vaginal discharge/abdominal cramping  HPI:   RLQ pain Same day visit for right lower abdominal cramping and vaginal discharge, odor, and itching x 3 days. Denies fevers, chills, vomiting, or diarrhea. Is stooling and voiding normally. Is concerned about STDs and pregnancy. Abdominal pain is sharp and radiates to her back. She has had about 3 days of mild vaginal bleeding. Does not use any contraception. Last period was a few years ago after which she had an endometrial ablation. Has not tried medication for pain. Pain has no relationship to food.  Elevated BP BP very elevated and has been noted on multiple prior visits. Currently, no chest pain, dyspnea, or headache, though she did have a brief episode of chest pain and shortness of breath 3 nights ago. She has not taken her antihypertensives in 'a while'. Denies blurred vision, neurologic change, or otherwise feeling different from baseline. Does not check BPs.    Review of Systems - Per HPI.   PMH-panic disorder, cigarette smoker, hypertension, GAD, migraine, elevated LFTs H/o endometrial ablation and no longer has periods. SH: Current everyday smoker  Objective:  Physical Exam BP 224/124 mmHg  Pulse 83  Temp(Src) 98.2 F (36.8 C) (Oral)  Ht 5\' 9"  (1.753 m)  Wt 178 lb 6 oz (80.91 kg)  BMI 26.33 kg/m2 GEN: NAD ABD: S/NT/ND, NABS, no organomegaly GU: Normal external genitalia; cervix with thick mucopurulent discharge and mild bleeding, no CMT, uterus and adnexae normal size and mobility CV: RRR, no m/r/g PULM: Normal effort NEURO: Awake, alert, no focal deficits, normal speech and gait    Assessment:     Katelyn [name] is a 39 y.o. female here for RLQ abdominal cramping and vaginal discharge, also with very elevated BP.    Plan:     # See problem list and after visit summary for problem-specific plans.   # Health  Maintenance: Return to discuss BP in more detail. Briefly discussed smoking cessation.  Follow-up: Follow up in 1 day for f/u of BP.    Hilton Sinclair, MD [place]

## 2014-06-23 ENCOUNTER — Ambulatory Visit (INDEPENDENT_AMBULATORY_CARE_PROVIDER_SITE_OTHER): Payer: Self-pay | Admitting: Family Medicine

## 2014-06-23 ENCOUNTER — Other Ambulatory Visit: Payer: Self-pay | Admitting: Family Medicine

## 2014-06-23 ENCOUNTER — Encounter: Payer: Self-pay | Admitting: Family Medicine

## 2014-06-23 VITALS — BP 180/90 | Temp 98.7°F | Ht 69.0 in | Wt 179.0 lb

## 2014-06-23 DIAGNOSIS — Z5181 Encounter for therapeutic drug level monitoring: Secondary | ICD-10-CM

## 2014-06-23 DIAGNOSIS — R1031 Right lower quadrant pain: Secondary | ICD-10-CM | POA: Insufficient documentation

## 2014-06-23 DIAGNOSIS — N898 Other specified noninflammatory disorders of vagina: Secondary | ICD-10-CM | POA: Insufficient documentation

## 2014-06-23 DIAGNOSIS — R0683 Snoring: Secondary | ICD-10-CM | POA: Insufficient documentation

## 2014-06-23 DIAGNOSIS — Z79899 Other long term (current) drug therapy: Principal | ICD-10-CM

## 2014-06-23 DIAGNOSIS — I10 Essential (primary) hypertension: Secondary | ICD-10-CM

## 2014-06-23 HISTORY — DX: Right lower quadrant pain: R10.31

## 2014-06-23 LAB — COMPREHENSIVE METABOLIC PANEL
ALBUMIN: 4.2 g/dL (ref 3.5–5.2)
ALT: 21 U/L (ref 0–35)
AST: 24 U/L (ref 0–37)
Alkaline Phosphatase: 71 U/L (ref 39–117)
BUN: 13 mg/dL (ref 6–23)
CO2: 24 meq/L (ref 19–32)
Calcium: 9.1 mg/dL (ref 8.4–10.5)
Chloride: 106 mEq/L (ref 96–112)
Creat: 0.91 mg/dL (ref 0.50–1.10)
GLUCOSE: 116 mg/dL — AB (ref 70–99)
Potassium: 3.4 mEq/L — ABNORMAL LOW (ref 3.5–5.3)
Sodium: 141 mEq/L (ref 135–145)
Total Bilirubin: 0.5 mg/dL (ref 0.2–1.2)
Total Protein: 7 g/dL (ref 6.0–8.3)

## 2014-06-23 LAB — GC/CHLAMYDIA PROBE AMP (~~LOC~~) NOT AT ARMC
Chlamydia: NEGATIVE
Neisseria Gonorrhea: NEGATIVE

## 2014-06-23 LAB — RPR

## 2014-06-23 LAB — HIV ANTIBODY (ROUTINE TESTING W REFLEX): HIV 1&2 Ab, 4th Generation: NONREACTIVE

## 2014-06-23 MED ORDER — SPIRONOLACTONE 50 MG PO TABS
50.0000 mg | ORAL_TABLET | Freq: Every day | ORAL | Status: DC
Start: 2014-06-23 — End: 2015-01-11

## 2014-06-23 MED ORDER — METRONIDAZOLE 500 MG PO TABS: 500.0000 mg | ORAL_TABLET | Freq: Two times a day (BID) | ORAL | Status: DC

## 2014-06-23 MED ORDER — AMLODIPINE BESYLATE 10 MG PO TABS: 10.0000 mg | ORAL_TABLET | Freq: Every day | ORAL | Status: DC

## 2014-06-23 NOTE — Assessment & Plan Note (Signed)
Wet prep yesterday consistent with BV. Sent in Rx for metronidazole. Also informed patient of negative HIV and RPR.

## 2014-06-23 NOTE — Assessment & Plan Note (Signed)
Abdominal cramping with benign exam, genitourinary exam with thick mucopurulent discharge and mild bleeding, likely from cervical irritation. Patient concern for STD. No fevers or chills. VSS, no concern at this time for appendicitis or PID or ovarian torsion or obstruction. -Wet prep, GC, chlamydia, HIV, RPR, UPreg. -Discussed protection against STDs with always using condoms. -Always stay hydrated -If abdominal pain worsens or she develops fevers, seek immediate care. Otherwise, f/u 1 week if not improving.

## 2014-06-23 NOTE — Assessment & Plan Note (Addendum)
SBP elevated to 180 in clinic today. No clinic signs of end organ damage. BMP drawn yesterday unremarkable. Will start patient on spironolactone today for resistant hypertension and continue other medications. Instructed patient to return in 1-2 weeks for BP follow up and repeat BMP.

## 2014-06-23 NOTE — Assessment & Plan Note (Addendum)
Hypertensive urgency with no symptoms of end organ damage. With a history of poor compliance and elevated pressures. -CMET -Had long discussion about patient with importance of medications to avoid stroke or heart attack -Discussed compliance and asked patient to go home and take antihypertensives today -Also asked patient to return tomorrow for appointment to recheck blood pressure and discuss. Immediate evaluation reasons discussed such as chest pain or dyspnea. -Precepted with Dr Erin Hearing.

## 2014-06-23 NOTE — Assessment & Plan Note (Signed)
Likely OSA given constellation of snoring, PND, and daytime drowsiness. Also likely contributing to patient's resistant hypertension. Placed order for split night sleep study.

## 2014-06-23 NOTE — Patient Instructions (Addendum)
Thank you for coming to the clinic today. It was nice seeing you.  For your blood pressure, we will start a new medication called spironolactone today. Please take 1 pill a day. I would like for you to come back in 1-2 weeks for a check up and repeat blood work. Please seek medical care immediately if you have severe chest pain or shortness of breath.  We will also help set you up with a sleep study. You will receive a call to help set this up.  We will send in a prescription for Flagyl. Please take 1 pill twice a day for 7 days.   See you again in 1-2 weeks.

## 2014-06-23 NOTE — Progress Notes (Signed)
Katelyn [name] is a 39 y.o. female who presents to the [facility] today with a chief complaint of blood pressure follow up. Her concerns today include:  HPI:  HTN Patient noted to be hypertensive during clinic yesterday and comes in today for recheck. Endorses good compliance with current medications. SBP in clinic yesterday into 200s. No chest pain or shortness of breath. No orthopnea. Some PND. No LE edema. No transiet weakness.   BP Readings from Last 3 Encounters:  06/23/14 180/90  06/22/14 204/126  01/30/14 150/99   Snoring / PND Patient endorses occasionally waking up at night feeling short of breath and like she is choking. Also endorses snoring while sleeping. Increased tiredness during the day. Currently sleeping on 2 pillows.   Vaginal Discharge Patient seen yesterday in clinic for vaginal discharge and odor for 3 days. Had a wet prep which was consistent with BV. Has not received any antibiotics.   ROS: As per HPI, otherwise all systems reviewed and are negative.  Past Medical History - Reviewed and updated Patient Active Problem List   Diagnosis Date Noted  . Snoring 06/23/2014  . Vaginal discharge 06/23/2014  . Atypical chest pain 01/30/2014  . Migraine headache without aura 07/29/2012  . Elevated LFTs 04/26/2012  . GAD (generalized anxiety disorder) 03/20/2011  . Panic disorder with agoraphobia and moderate panic attacks 02/26/2009  . CIGARETTE SMOKER 11/24/2008  . Severe hypertension 03/05/2006    Medications- reviewed and updated Current Outpatient Prescriptions  Medication Sig Dispense Refill  . amLODipine (NORVASC) 10 MG tablet Take 1 tablet (10 mg total) by mouth daily. 90 tablet 3  . aspirin-acetaminophen-caffeine (EXCEDRIN MIGRAINE) 323-557-32 MG per tablet Take 1 tablet by mouth every 6 (six) hours as needed for headache.    Marland Kitchen atorvastatin (LIPITOR) 40 MG tablet Take 1 tablet (40 mg total) by mouth daily. 30 tablet 3  . carvedilol (COREG) 3.125 MG tablet  Take 1 tablet (3.125 mg total) by mouth 2 (two) times daily with a meal. 60 tablet 3  . lisinopril-hydrochlorothiazide (ZESTORETIC) 20-12.5 MG per tablet Take 1 tablet by mouth daily. 30 tablet 0  . metroNIDAZOLE (FLAGYL) 500 MG tablet Take 1 tablet (500 mg total) by mouth 2 (two) times daily. 14 tablet 0  . sertraline (ZOLOFT) 100 MG tablet TAKE ONE TABLET BY MOUTH ONCE DAILY 30 tablet 5  . spironolactone (ALDACTONE) 50 MG tablet Take 1 tablet (50 mg total) by mouth daily. 30 tablet 1  . [DISCONTINUED] citalopram (CELEXA) 10 MG tablet Take 1 tablet (10 mg total) by mouth daily. 30 tablet 2  . [DISCONTINUED] omeprazole (PRILOSEC) 20 MG capsule Take 1 capsule (20 mg total) by mouth daily. 30 capsule 2  . [DISCONTINUED] potassium chloride SA (K-DUR,KLOR-CON) 20 MEQ tablet Take 1 tablet (20 mEq total) by mouth daily. For 7 days 7 tablet 0   No current facility-administered medications for this visit.    Objective: Physical Exam: BP 180/90 mmHg  Temp(Src) 98.7 F (37.1 C) (Oral)  Ht 5\' 9"  (1.753 m)  Wt 179 lb (81.194 kg)  BMI 26.42 kg/m2  Gen: NAD, resting comfortably CV: RRR, 2/6 systolic murmur noted. Lungs: NWOB, CTAB with no crackles, wheezes, or rhonchi Abdomen: Normal bowel sounds present. Soft, Nontender, Nondistended. Ext: no edema Skin: warm, dry Neuro: grossly normal, moves all extremities  Results for orders placed or performed in visit on 06/22/14 (from the past 72 hour(s))  HIV antibody (with reflex)     Status: None (Preliminary result)   Collection  Time: 06/22/14  4:58 PM  Result Value Ref Range   HIV 1&2 Ab, 4th Generation  NONREACTIVE    Comment:   PLEASE NOTE: This information has been disclosed to you from records whose confidentiality may be protected by state law. If your state requires such protection, then the state law prohibits you from making any further disclosure of the information without the specific written consent of the person to whom it pertains, or  as otherwise permitted by law. A general authorization for the release of medical or other information is NOT sufficient for this purpose.   The performance of this assay has not been clinically validated in patients less than 16 years old.   For additional information please refer to http://education.[employer].com/faq/FAQ106.  (This link is being provided for informational/educational purposes only.)     RPR     Status: None   Collection Time: 06/22/14  4:58 PM  Result Value Ref Range   RPR Ser Ql NON REAC NON REAC  Comprehensive metabolic panel     Status: Abnormal   Collection Time: 06/22/14  4:58 PM  Result Value Ref Range   Sodium 141 135 - 145 mEq/L   Potassium 3.4 (L) 3.5 - 5.3 mEq/L   Chloride 106 96 - 112 mEq/L   CO2 24 19 - 32 mEq/L   Glucose, Bld 116 (H) 70 - 99 mg/dL   BUN 13 6 - 23 mg/dL   Creat 0.91 0.50 - 1.10 mg/dL   Total Bilirubin 0.5 0.2 - 1.2 mg/dL   Alkaline Phosphatase 71 39 - 117 U/L   AST 24 0 - 37 U/L   ALT 21 0 - 35 U/L   Total Protein 7.0 6.0 - 8.3 g/dL   Albumin 4.2 3.5 - 5.2 g/dL   Calcium 9.1 8.4 - 10.5 mg/dL  POCT Wet Prep [facility])     Status: Abnormal   Collection Time: 06/22/14  5:00 PM  Result Value Ref Range   Source Wet Prep POC VAG    WBC, Wet Prep HPF POC LOADED    Bacteria Wet Prep HPF POC Many (A) Few   Clue Cells Wet Prep HPF POC Moderate (A) None   Clue Cells Wet Prep Whiff POC Positive Whiff    Yeast Wet Prep HPF POC None    Trichomonas Wet Prep HPF POC NONE   POCT urine pregnancy     Status: None   Collection Time: 06/22/14  5:00 PM  Result Value Ref Range   Preg Test, Ur Negative Negative    A/P: See problem list  Severe hypertension SBP elevated to 180 in clinic today. No clinic signs of end organ damage. BMP drawn yesterday unremarkable. Will start patient on spironolactone today for resistant hypertension and continue other medications. Instructed patient to return in 1-2 weeks for BP follow up and repeat  BMP.  Snoring Likely OSA given constellation of snoring, PND, and daytime drowsiness. Also likely contributing to patient's resistant hypertension. Placed order for split night sleep study.   Vaginal discharge Wet prep yesterday consistent with BV. Sent in Rx for metronidazole. Also informed patient of negative HIV and RPR.     Orders Placed This Encounter  Procedures  . Split night study    Standing Status: Future     Number of Occurrences:      Standing Expiration Date: 06/23/2015    Order Specific Question:  Where should this test be performed:    Answer:  [facility]  Meds ordered this encounter  Medications  . spironolactone (ALDACTONE) 50 MG tablet    Sig: Take 1 tablet (50 mg total) by mouth daily.    Dispense:  30 tablet    Refill:  1  . metroNIDAZOLE (FLAGYL) 500 MG tablet    Sig: Take 1 tablet (500 mg total) by mouth 2 (two) times daily.    Dispense:  14 tablet    Refill:  0  . amLODipine (NORVASC) 10 MG tablet    Sig: Take 1 tablet (10 mg total) by mouth daily.    Dispense:  90 tablet    Refill:  3     Caleb M. Jerline Pain, [place] Resident PGY-1 06/23/2014 3:02 PM

## 2014-06-26 ENCOUNTER — Telehealth: Payer: Self-pay

## 2014-06-26 NOTE — Telephone Encounter (Signed)
Called patient and left message for her to schedule an appointment to get potassium levels tested.  Infromed her that she did not need to see a provider, only lab.Katelyn [name]

## 2014-06-26 NOTE — Telephone Encounter (Signed)
-----   Message from Mariel Aloe, MD sent at 06/23/2014  4:23 PM EDT ----- Please call patient to ask her to get blood work (lab visit only) for BMET early next week (6/20-6-22) for recheck of potassium since starting Spironolactone. Thanks

## 2014-06-27 ENCOUNTER — Encounter: Payer: Self-pay | Admitting: Family Medicine

## 2014-06-27 ENCOUNTER — Other Ambulatory Visit (INDEPENDENT_AMBULATORY_CARE_PROVIDER_SITE_OTHER): Payer: Self-pay

## 2014-06-27 DIAGNOSIS — Z79899 Other long term (current) drug therapy: Principal | ICD-10-CM

## 2014-06-27 DIAGNOSIS — Z5181 Encounter for therapeutic drug level monitoring: Secondary | ICD-10-CM

## 2014-06-27 LAB — BASIC METABOLIC PANEL
BUN: 13 mg/dL (ref 6–23)
CO2: 24 meq/L (ref 19–32)
Calcium: 9.4 mg/dL (ref 8.4–10.5)
Chloride: 106 mEq/L (ref 96–112)
Creat: 0.93 mg/dL (ref 0.50–1.10)
GLUCOSE: 123 mg/dL — AB (ref 70–99)
POTASSIUM: 3.3 meq/L — AB (ref 3.5–5.3)
Sodium: 142 mEq/L (ref 135–145)

## 2014-06-27 NOTE — Progress Notes (Signed)
BMP DONE TODAY Katelyn [name] 

## 2014-06-28 NOTE — Telephone Encounter (Signed)
Called patient to review labwork - wet prep with BV that she was aware of and has taken metronidazole with some improvement in discharge. HIV/RPR negative. Upreg neg. GC/Chlamydia negative.   She also states that since starting spironolactone, BP has been "improved." Asked her to be sure to f/u in 1.5-2 weeks for f/u of BP and recheck of K. It has been a little low last 2 checks. Although on K sparing diuretic, asked her to eat foods high in K (but will not start K supplement).  She voiced understanding.  Katelyn Sinclair, MD

## 2014-07-11 ENCOUNTER — Ambulatory Visit: Payer: Commercial Indemnity | Admitting: Family Medicine

## 2014-07-31 ENCOUNTER — Emergency Department (HOSPITAL_COMMUNITY)
Admission: EM | Admit: 2014-07-31 | Discharge: 2014-07-31 | Disposition: A | Discharge status: Home / Self Care | Payer: Commercial Indemnity | Attending: Emergency Medicine | Admitting: Emergency Medicine

## 2014-07-31 ENCOUNTER — Emergency Department (HOSPITAL_COMMUNITY): Payer: Commercial Indemnity

## 2014-07-31 ENCOUNTER — Encounter (HOSPITAL_COMMUNITY): Payer: Self-pay | Admitting: Family Medicine

## 2014-07-31 DIAGNOSIS — Z862 Personal history of diseases of the blood and blood-forming organs and certain disorders involving the immune mechanism: Secondary | ICD-10-CM | POA: Insufficient documentation

## 2014-07-31 DIAGNOSIS — Z720 Tobacco use: Secondary | ICD-10-CM | POA: Insufficient documentation

## 2014-07-31 DIAGNOSIS — I10 Essential (primary) hypertension: Secondary | ICD-10-CM | POA: Insufficient documentation

## 2014-07-31 DIAGNOSIS — F329 Major depressive disorder, single episode, unspecified: Secondary | ICD-10-CM | POA: Insufficient documentation

## 2014-07-31 DIAGNOSIS — G43909 Migraine, unspecified, not intractable, without status migrainosus: Secondary | ICD-10-CM | POA: Insufficient documentation

## 2014-07-31 DIAGNOSIS — F419 Anxiety disorder, unspecified: Secondary | ICD-10-CM | POA: Insufficient documentation

## 2014-07-31 DIAGNOSIS — Z79899 Other long term (current) drug therapy: Secondary | ICD-10-CM | POA: Insufficient documentation

## 2014-07-31 LAB — CBC WITH DIFFERENTIAL/PLATELET
Basophils Absolute: 0 10*3/uL (ref 0.0–0.1)
Basophils Relative: 0 % (ref 0–1)
Eosinophils Absolute: 0.4 10*3/uL (ref 0.0–0.7)
Eosinophils Relative: 4 % (ref 0–5)
HCT: 39.9 % (ref 36.0–46.0)
Hemoglobin: 14 g/dL (ref 12.0–15.0)
LYMPHS PCT: 19 % (ref 12–46)
Lymphs Abs: 1.8 10*3/uL (ref 0.7–4.0)
MCH: 31.3 pg (ref 26.0–34.0)
MCHC: 35.1 g/dL (ref 30.0–36.0)
MCV: 89.1 fL (ref 78.0–100.0)
Monocytes Absolute: 0.5 10*3/uL (ref 0.1–1.0)
Monocytes Relative: 6 % (ref 3–12)
NEUTROS ABS: 6.7 10*3/uL (ref 1.7–7.7)
NEUTROS PCT: 71 % (ref 43–77)
PLATELETS: 171 10*3/uL (ref 150–400)
RBC: 4.48 MIL/uL (ref 3.87–5.11)
RDW: 12.7 % (ref 11.5–15.5)
WBC: 9.4 10*3/uL (ref 4.0–10.5)

## 2014-07-31 LAB — BASIC METABOLIC PANEL
Anion gap: 6 (ref 5–15)
BUN: 11 mg/dL (ref 6–20)
CHLORIDE: 109 mmol/L (ref 101–111)
CO2: 24 mmol/L (ref 22–32)
CREATININE: 0.86 mg/dL (ref 0.44–1.00)
Calcium: 9 mg/dL (ref 8.9–10.3)
Glucose, Bld: 98 mg/dL (ref 65–99)
Potassium: 3.7 mmol/L (ref 3.5–5.1)
SODIUM: 139 mmol/L (ref 135–145)

## 2014-07-31 LAB — TROPONIN I

## 2014-07-31 MED ORDER — LABETALOL HCL 5 MG/ML IV SOLN
10.0000 mg | Freq: Once | INTRAVENOUS | Status: AC
  Administered 2014-07-31: 10 mg via INTRAVENOUS
  Filled 2014-07-31: qty 4

## 2014-07-31 MED ORDER — ONDANSETRON HCL 4 MG/2ML IJ SOLN
4.0000 mg | Freq: Once | INTRAMUSCULAR | Status: DC
  Filled 2014-07-31: qty 2

## 2014-07-31 MED ORDER — SODIUM CHLORIDE 0.9 % IV SOLN
INTRAVENOUS | Status: DC
  Administered 2014-07-31: 11:00:00 via INTRAVENOUS

## 2014-07-31 NOTE — ED Notes (Signed)
Pt here with hypertension and body hurting all over. sts her BP was 190 sys. sts she took her BP meds this am.

## 2014-07-31 NOTE — ED Notes (Signed)
Patient transported to X-ray 

## 2014-07-31 NOTE — Discharge Instructions (Signed)

## 2014-07-31 NOTE — ED Provider Notes (Signed)
CSN: [id number]     Arrival date & time 07/31/14  0806 History   First MD Initiated Contact with Patient 07/31/14 0913     Chief Complaint  Patient presents with  . Hypertension     (Consider location/radiation/quality/duration/timing/severity/associated sxs/prior Treatment) HPI    PCP: Katelyn Poche, MD Blood pressure 177/108, pulse 64, temperature 98.2 F (36.8 C), resp. rate 18, SpO2 100 %.  Katelyn [name] is a 39 y.o.female with a significant PMH of hypertension, palpitations, chest pain, anemia, anxiety, depression, migraine presents to the ER with complaints of hypertension and not feeling well. Patient has long history of resistant hypertension. Takes Norvasc, Carvedilol, Lisinopril-HCTZ.. Last week she had BP's in the 200's at her PCP, therefore they added Spironolactone. She also has mild hyperkalemia per the clinic notes. The patient admits to non compliance because the medication makes her feel unusual, therefore she takes the medication at different times everyday. She took a dose last night and took one this morning and her blood pressure is still elevated. Because her clinic told her high BP puts her at risk for stroke or heart disease she came to the ED for evaluation. She also reports feeling pain and hurting all over, admits to minor mild headache over the past week.     The patient denies diaphoresis, fever, headache, weakness (general or focal), confusion, change of vision,  neck pain, dysphagia, aphagia, chest pain, shortness of breath,  back pain, abdominal pains, nausea, vomiting, diarrhea, lower extremity swelling, rash.  Past Medical History  Diagnosis Date  . Hypertension   . Palpitations   . Chest pain   . Anemia   . Anxiety   . Depression   . Migraine    Past Surgical History  Procedure Laterality Date  . Hysteroscopy w/ endometrial ablation    . Tubal ligation    . Abdominal hysterectomy     Family History  Problem Relation Age of Onset  . Diabetes  Mother   . Depression Mother   . Depression Sister    History  Substance Use Topics  . Smoking status: Current Every Day Smoker -- 0.50 packs/day    Types: Cigarettes    Start date: 01/06/1994  . Smokeless tobacco: Never Used  . Alcohol Use: 2.0 oz/week    4 drink(s) per week     Comment: occasional   OB History    No data available     Review of Systems  10 Systems reviewed and are negative for acute change except as noted in the HPI.   Allergies  Metoprolol  Home Medications   Prior to Admission medications   Medication Sig Start Date End Date Taking? Authorizing Provider  amLODipine (NORVASC) 10 MG tablet Take 1 tablet (10 mg total) by mouth daily. 06/23/14  Yes Vivi Barrack, MD  atorvastatin (LIPITOR) 40 MG tablet Take 1 tablet (40 mg total) by mouth daily. 02/05/14  Yes Hilton Sinclair, MD  carvedilol (COREG) 3.125 MG tablet Take 1 tablet (3.125 mg total) by mouth 2 (two) times daily with a meal. 07/07/13  Yes Leone Brand, MD  lisinopril-hydrochlorothiazide (ZESTORETIC) 20-12.5 MG per tablet Take 1 tablet by mouth daily. 11/03/13  Yes Mariel Aloe, MD  sertraline (ZOLOFT) 100 MG tablet TAKE ONE TABLET BY MOUTH ONCE DAILY 11/02/13  Yes Mariel Aloe, MD  spironolactone (ALDACTONE) 50 MG tablet Take 1 tablet (50 mg total) by mouth daily. 06/23/14  Yes Vivi Barrack, MD  aspirin-acetaminophen-caffeine ([place]) [phone]  MG per tablet Take 1 tablet by mouth every 6 (six) hours as needed for headache.    Historical Provider, MD  metroNIDAZOLE (FLAGYL) 500 MG tablet Take 1 tablet (500 mg total) by mouth 2 (two) times daily. Patient not taking: Reported on 07/31/2014 06/23/14   Vivi Barrack, MD   BP 183/101 mmHg  Pulse 66  Temp(Src) 98.2 F (36.8 C)  Resp 20  SpO2 100% Physical Exam  Constitutional: She appears well-developed and well-nourished. No distress.  HENT:  Head: Normocephalic and atraumatic.  Eyes: Pupils are equal, round, and reactive to  light.  Neck: Normal range of motion. Neck supple.  Cardiovascular: Normal rate and regular rhythm.   Pulmonary/Chest: Effort normal and breath sounds normal.  Abdominal: Soft.  Musculoskeletal:  No LE swelling  Neurological: She is alert.  Cranial nerves II-VIII and X-XII evaluated and show no deficits. Pt alert and oriented x 3 Upper and lower extremity strength is symmetrical and physiologic Normal muscular tone No facial droop Coordination intact, no limb ataxia,Rapid alternating movements normal No pronator drift  Skin: Skin is warm and dry.  Nursing note and vitals reviewed.   ED Course  Procedures (including critical care time) Labs Review Labs Reviewed  CBC WITH DIFFERENTIAL/PLATELET  BASIC METABOLIC PANEL  TROPONIN I    Imaging Review Dg Chest 2 View  07/31/2014   CLINICAL DATA:  Hypertension  EXAM: CHEST  2 VIEW  COMPARISON:  02/03/2010  FINDINGS: Heart size upper normal. Negative for heart failure. Lungs are clear without infiltrate or effusion. No mass or adenopathy.  IMPRESSION: No active cardiopulmonary disease.   Electronically Signed   By: Franchot Gallo M.D.   On: 07/31/2014 10:15     EKG Interpretation None      MDM   Final diagnoses:  Hypertension    CBC, BMP, Troponin and chest xray all within normal limits. Also, normal chest xray.   Medications  0.9 %  sodium chloride infusion ( Intravenous New Bag/Given 07/31/14 1032)  ondansetron (ZOFRAN) injection 4 mg (4 mg Intravenous Not Given 07/31/14 1031)  labetalol (NORMODYNE,TRANDATE) injection 10 mg (10 mg Intravenous Given 07/31/14 1029)  labetalol (NORMODYNE,TRANDATE) injection 10 mg (10 mg Intravenous Given 07/31/14 1141)   Patient given meds in the ED and this helped improve her BP, she denies feeling any better or any worse but endorses feeling hungry.  Patient to call PCP at [facility] to arrange f/u appointment for her chronic hypertension.   39 y.o.[name]'s  evaluation in the Emergency Department is complete. It has been determined that no acute conditions requiring further emergency intervention are present at this time. The patient/guardian have been advised of the diagnosis and plan. We have discussed signs and symptoms that warrant return to the ED, such as changes or worsening in symptoms.  Vital signs are stable at discharge. Filed Vitals:   07/31/14 1130  BP: 183/101  Pulse: 66  Temp:   Resp: 20    Patient/guardian has voiced understanding and agreed to follow-up with the PCP or specialist.    Delos Haring, PA-C 07/31/14 1152 we'll have patient follow-up with the primary care doctor to further adjust and work on patient's chronic hypertension.  Delos Haring, PA-C 07/31/14 1153  Elnora Morrison, MD 07/31/14 [phone]

## 2014-07-31 NOTE — ED Notes (Signed)
Pt is in stable condition upon d/c and ambulates from ED. 

## 2014-09-13 ENCOUNTER — Ambulatory Visit (HOSPITAL_BASED_OUTPATIENT_CLINIC_OR_DEPARTMENT_OTHER): Payer: Commercial Indemnity

## 2014-11-24 ENCOUNTER — Observation Stay (HOSPITAL_COMMUNITY)
Admission: AD | Admit: 2014-11-24 | Discharge: 2014-11-25 | Disposition: A | Discharge status: Home / Self Care | Payer: Self-pay | Source: Ambulatory Visit | Attending: Obstetrics & Gynecology | Admitting: Obstetrics & Gynecology

## 2014-11-24 ENCOUNTER — Encounter (HOSPITAL_COMMUNITY): Payer: Self-pay | Admitting: Emergency Medicine

## 2014-11-24 DIAGNOSIS — N83519 Torsion of ovary and ovarian pedicle, unspecified side: Principal | ICD-10-CM | POA: Diagnosis present

## 2014-11-24 DIAGNOSIS — R935 Abnormal findings on diagnostic imaging of other abdominal regions, including retroperitoneum: Secondary | ICD-10-CM

## 2014-11-24 DIAGNOSIS — I1A0 Resistant hypertension: Secondary | ICD-10-CM | POA: Diagnosis present

## 2014-11-24 DIAGNOSIS — R1031 Right lower quadrant pain: Secondary | ICD-10-CM | POA: Diagnosis present

## 2014-11-24 DIAGNOSIS — F17210 Nicotine dependence, cigarettes, uncomplicated: Secondary | ICD-10-CM | POA: Insufficient documentation

## 2014-11-24 DIAGNOSIS — Z3202 Encounter for pregnancy test, result negative: Secondary | ICD-10-CM | POA: Insufficient documentation

## 2014-11-24 DIAGNOSIS — F17200 Nicotine dependence, unspecified, uncomplicated: Secondary | ICD-10-CM | POA: Diagnosis present

## 2014-11-24 DIAGNOSIS — I10 Essential (primary) hypertension: Secondary | ICD-10-CM | POA: Insufficient documentation

## 2014-11-24 LAB — CBC
HCT: 38 % (ref 36.0–46.0)
Hemoglobin: 13.3 g/dL (ref 12.0–15.0)
MCH: 31 pg (ref 26.0–34.0)
MCHC: 35 g/dL (ref 30.0–36.0)
MCV: 88.6 fL (ref 78.0–100.0)
PLATELETS: 168 10*3/uL (ref 150–400)
RBC: 4.29 MIL/uL (ref 3.87–5.11)
RDW: 12.4 % (ref 11.5–15.5)
WBC: 14.7 10*3/uL — AB (ref 4.0–10.5)

## 2014-11-24 NOTE — ED Notes (Signed)
Pt. reports generalized abdominal pain with nausea and emesis onset thi evening , hypertensive at triage pt. stated she has not taken her antihypertensive medication this evening .

## 2014-11-24 NOTE — ED Provider Notes (Signed)
CSN: [id number]     Arrival date & time 11/24/14  2254 History  By signing my name below, I, Jolayne Panther, attest that this documentation has been prepared under the direction and in the presence of Merryl Hacker, MD. Electronically Signed: Jolayne Panther, Scribe. 11/24/2014. 12:33 AM.    Chief Complaint  Patient presents with  . Abdominal Pain  . Hypertension      Patient is a 39 y.o. female presenting with hypertension. The history is provided by the patient. No language interpreter was used.  Hypertension Associated symptoms include abdominal pain. Pertinent negatives include no chest pain and no shortness of breath.    HPI Comments: Katelyn [name] is a 39 y.o. female who presents to the Emergency Department complaining of sharp 10/10 abdominal pain onset 6 PM this evening. Pt describes a "cramping" pain in her abdomen and states that she has had once incidence of emesis on arrival to the ED but denies any blood or vile in her vomit. She has not taken anything for relief and did not take her medication for her HTN today either. Pt states that her daughter was sick and vomiting two days ago. She denies diarrhea, dysuria, hematuria, vaginal discharge, or concern for STDs. She has no other major medical problems other than her HTN. Pt's tubes are tied.   Past Medical History  Diagnosis Date  . Hypertension   . Palpitations   . Chest pain   . Anemia   . Anxiety   . Depression   . Migraine    Past Surgical History  Procedure Laterality Date  . Hysteroscopy w/ endometrial ablation  2010  . Tubal ligation  2006   Family History  Problem Relation Age of Onset  . Diabetes Mother   . Depression Mother   . Depression Sister    Social History  Substance Use Topics  . Smoking status: Current Every Day Smoker -- 0.00 packs/day    Types: Cigarettes    Start date: 01/06/1994  . Smokeless tobacco: Never Used  . Alcohol Use: Yes   OB History    Gravida Para Term  Preterm AB TAB SAB Ectopic Multiple Living   6 3 3  3     3      Review of Systems  Constitutional: Positive for fever.  Respiratory: Negative for shortness of breath.   Cardiovascular: Negative for chest pain.  Gastrointestinal: Positive for vomiting and abdominal pain. Negative for diarrhea.  Genitourinary: Negative for dysuria, hematuria and vaginal discharge.  All other systems reviewed and are negative.     Allergies  Metoprolol  Home Medications   Prior to Admission medications   Medication Sig Start Date End Date Taking? Authorizing Provider  amLODipine (NORVASC) 10 MG tablet Take 1 tablet (10 mg total) by mouth daily. 06/23/14  Yes Vivi Barrack, MD  aspirin-acetaminophen-caffeine (EXCEDRIN MIGRAINE) [phone] MG per tablet Take 1 tablet by mouth every 6 (six) hours as needed for headache.   Yes Historical Provider, MD  atorvastatin (LIPITOR) 40 MG tablet Take 1 tablet (40 mg total) by mouth daily. 02/05/14  Yes Hilton Sinclair, MD  carvedilol (COREG) 3.125 MG tablet Take 1 tablet (3.125 mg total) by mouth 2 (two) times daily with a meal. 07/07/13  Yes Leone Brand, MD  lisinopril-hydrochlorothiazide (ZESTORETIC) 20-12.5 MG per tablet Take 1 tablet by mouth daily. 11/03/13  Yes Mariel Aloe, MD  sertraline (ZOLOFT) 100 MG tablet TAKE ONE TABLET BY MOUTH ONCE DAILY 11/02/13  Yes Mariel Aloe, MD  spironolactone (ALDACTONE) 50 MG tablet Take 1 tablet (50 mg total) by mouth daily. 06/23/14  Yes Vivi Barrack, MD  metroNIDAZOLE (FLAGYL) 500 MG tablet Take 1 tablet (500 mg total) by mouth 2 (two) times daily. Patient not taking: Reported on 07/31/2014 06/23/14   Vivi Barrack, MD   BP 157/107 mmHg  Pulse 84  Temp(Src) 100.5 F (38.1 C) (Oral)  Resp 16  Ht 5\' 8"  (1.727 m)  Wt 165 lb (74.844 kg)  BMI 25.09 kg/m2  SpO2 100% Physical Exam  Constitutional: She is oriented to person, place, and time. She appears well-developed and well-nourished. No distress.  HENT:   Head: Normocephalic and atraumatic.  Cardiovascular: Normal rate, regular rhythm and normal heart sounds.   No murmur heard. Pulmonary/Chest: Effort normal and breath sounds normal. No respiratory distress. She has no wheezes.  Abdominal: Soft. Bowel sounds are normal. There is tenderness. There is no rebound and no guarding.  Diffuse lower tenderness to palpation worse right lower quadrant, no rebound or guarding,  Neurological: She is alert and oriented to person, place, and time.  Skin: Skin is warm and dry.  Psychiatric: She has a normal mood and affect.  Nursing note and vitals reviewed.   ED Course  Procedures  DIAGNOSTIC STUDIES:    Oxygen Saturation is 99% on RA, normal by my interpretation.   COORDINATION OF CARE:  5:19 AM Discussed treatment plan with pt at bedside and pt agreed to plan.     Labs Review Labs Reviewed  COMPREHENSIVE METABOLIC PANEL - Abnormal; Notable for the following:    Potassium 3.4 (*)    Glucose, Bld 102 (*)    Creatinine, Ser 1.14 (*)    GFR calc non Af Amer 59 (*)    All other components within normal limits  CBC - Abnormal; Notable for the following:    WBC 14.7 (*)    All other components within normal limits  URINALYSIS, ROUTINE W REFLEX MICROSCOPIC (NOT AT [facility]) - Abnormal; Notable for the following:    APPearance CLOUDY (*)    Protein, ur 100 (*)    All other components within normal limits  URINE MICROSCOPIC-ADD ON - Abnormal; Notable for the following:    Squamous Epithelial / LPF 6-30 (*)    Bacteria, UA RARE (*)    All other components within normal limits  LIPASE, BLOOD  PREGNANCY, URINE    Imaging Review US Transvaginal Non-ob  11/25/2014  CLINICAL DATA:  Right lower quadrant pain. Abnormal appearance of left ovary on CT. EXAM: TRANSABDOMINAL AND TRANSVAGINAL ULTRASOUND OF PELVIS DOPPLER ULTRASOUND OF OVARIES TECHNIQUE: Both transabdominal and transvaginal ultrasound examinations of the pelvis were performed.  Transabdominal technique was performed for global imaging of the pelvis including uterus, ovaries, adnexal regions, and pelvic cul-de-sac. It was necessary to proceed with endovaginal exam following the transabdominal exam to visualize the right and left ovary. Color and duplex Doppler ultrasound was utilized to evaluate blood flow to the ovaries. COMPARISON:  CT earlier this day. FINDINGS: Uterus Measurements: 8.2 x 3.5 x 5.1 cm. No fibroids or other mass visualized. Endometrium Thickness: 6 mm.  No focal abnormality visualized. Right ovary Measurements: 2.6 x 1.8 x 1.9 cm. Normal appearance/no adnexal mass. There is normal blood flow. Left ovary Measurements: 5.5 x 3.5 x 4.1 cm. The ovary appears echogenic and is avascular. The majority of the ovary demonstrates no parenchymal blood flow. There is initially some blood flow to the periphery with  arterial and venous waveforms, however this is not confirmed on repeat evaluation. Pulsed Doppler evaluation of both ovaries demonstrates normal low-resistance arterial and venous waveforms to the right ovary. Other findings Trace free fluid. IMPRESSION: 1. Enlarged echogenic left ovary without definite parenchymal blood flow. There may be some blood flow to the periphery initially, however this was not confirmed on subsequent evaluation. Suspect intermittent torsion. Question of underlying solid lesion. 2. Normal appearance of the right ovary. Critical Value/emergent results were called by telephone at the time of interpretation on 11/25/2014 at 4:23 am to Dr. Thayer Jew , who verbally acknowledged these results. Electronically Signed   By: Jeb Levering M.D.   On: 11/25/2014 04:24   US Pelvis Complete  11/25/2014  CLINICAL DATA:  Right lower quadrant pain. Abnormal appearance of left ovary on CT. EXAM: TRANSABDOMINAL AND TRANSVAGINAL ULTRASOUND OF PELVIS DOPPLER ULTRASOUND OF OVARIES TECHNIQUE: Both transabdominal and transvaginal ultrasound examinations of  the pelvis were performed. Transabdominal technique was performed for global imaging of the pelvis including uterus, ovaries, adnexal regions, and pelvic cul-de-sac. It was necessary to proceed with endovaginal exam following the transabdominal exam to visualize the right and left ovary. Color and duplex Doppler ultrasound was utilized to evaluate blood flow to the ovaries. COMPARISON:  CT earlier this day. FINDINGS: Uterus Measurements: 8.2 x 3.5 x 5.1 cm. No fibroids or other mass visualized. Endometrium Thickness: 6 mm.  No focal abnormality visualized. Right ovary Measurements: 2.6 x 1.8 x 1.9 cm. Normal appearance/no adnexal mass. There is normal blood flow. Left ovary Measurements: 5.5 x 3.5 x 4.1 cm. The ovary appears echogenic and is avascular. The majority of the ovary demonstrates no parenchymal blood flow. There is initially some blood flow to the periphery with arterial and venous waveforms, however this is not confirmed on repeat evaluation. Pulsed Doppler evaluation of both ovaries demonstrates normal low-resistance arterial and venous waveforms to the right ovary. Other findings Trace free fluid. IMPRESSION: 1. Enlarged echogenic left ovary without definite parenchymal blood flow. There may be some blood flow to the periphery initially, however this was not confirmed on subsequent evaluation. Suspect intermittent torsion. Question of underlying solid lesion. 2. Normal appearance of the right ovary. Critical Value/emergent results were called by telephone at the time of interpretation on 11/25/2014 at 4:23 am to Dr. Thayer Jew , who verbally acknowledged these results. Electronically Signed   By: Jeb Levering M.D.   On: 11/25/2014 04:24   Ct Abdomen Pelvis W Contrast  11/25/2014  CLINICAL DATA:  Right lower quadrant abdominal pain for 4 hours. EXAM: CT ABDOMEN AND PELVIS WITH CONTRAST TECHNIQUE: Multidetector CT imaging of the abdomen and pelvis was performed using the standard protocol  following bolus administration of intravenous contrast. CONTRAST:  164mL OMNIPAQUE IOHEXOL 300 MG/ML  SOLN COMPARISON:  CT 06/04/2012 FINDINGS: Lower chest: The included lung bases are clear. The heart is normal in size. Liver: No focal lesion. Fatty infiltration adjacent with falciform ligament. There is minimal periportal edema. Hepatobiliary: Gallbladder physiologically distended, no calcified stone. Common bile duct is normal in caliber. Pancreas: No ductal dilatation or inflammation. Spleen: Normal. Adrenal glands: No nodule. Kidneys: Symmetric renal enhancement. No hydronephrosis. 5 mm hypodensity in the lower right kidney, too small to accurately characterize. Stomach/Bowel: Stomach is decompressed. There are no dilated or thickened small bowel loops. Moderate volume of stool throughout the colon without colonic wall thickening. The appendix is normal. Vascular/Lymphatic: No retroperitoneal adenopathy. Abdominal aorta is normal in caliber. Reproductive: Left ovary is enlarged  measuring 5.9 x 3.5 cm in medially deviated. Right ovary tentatively identified and normal in size. Tubal ligation clips are noted. The uterus appears normal. There is free fluid in the pelvis. Bladder: Minimally distended not well evaluated. Other: No free air, upper abdominal free fluid, or intra-abdominal fluid collection. Musculoskeletal: There are no acute or suspicious osseous abnormalities. Progressive degenerative change at L5-S1. IMPRESSION: 1. Left ovarian enlargement with medial deviation. Recommend pelvic ultrasound for further evaluation and to exclude torsion. 2. Normal appendix. These results were called by telephone at the time of interpretation on 11/25/2014 at 3:12 am to Dr. Thayer Jew , who verbally acknowledged these results. Electronically Signed   By: Jeb Levering M.D.   On: 11/25/2014 03:12   [place] Art/ven Flow Abd Pelv Doppler  11/25/2014  CLINICAL DATA:  Right lower quadrant pain. Abnormal appearance of  left ovary on CT. EXAM: TRANSABDOMINAL AND TRANSVAGINAL ULTRASOUND OF PELVIS DOPPLER ULTRASOUND OF OVARIES TECHNIQUE: Both transabdominal and transvaginal ultrasound examinations of the pelvis were performed. Transabdominal technique was performed for global imaging of the pelvis including uterus, ovaries, adnexal regions, and pelvic cul-de-sac. It was necessary to proceed with endovaginal exam following the transabdominal exam to visualize the right and left ovary. Color and duplex Doppler ultrasound was utilized to evaluate blood flow to the ovaries. COMPARISON:  CT earlier this day. FINDINGS: Uterus Measurements: 8.2 x 3.5 x 5.1 cm. No fibroids or other mass visualized. Endometrium Thickness: 6 mm.  No focal abnormality visualized. Right ovary Measurements: 2.6 x 1.8 x 1.9 cm. Normal appearance/no adnexal mass. There is normal blood flow. Left ovary Measurements: 5.5 x 3.5 x 4.1 cm. The ovary appears echogenic and is avascular. The majority of the ovary demonstrates no parenchymal blood flow. There is initially some blood flow to the periphery with arterial and venous waveforms, however this is not confirmed on repeat evaluation. Pulsed Doppler evaluation of both ovaries demonstrates normal low-resistance arterial and venous waveforms to the right ovary. Other findings Trace free fluid. IMPRESSION: 1. Enlarged echogenic left ovary without definite parenchymal blood flow. There may be some blood flow to the periphery initially, however this was not confirmed on subsequent evaluation. Suspect intermittent torsion. Question of underlying solid lesion. 2. Normal appearance of the right ovary. Critical Value/emergent results were called by telephone at the time of interpretation on 11/25/2014 at 4:23 am to Dr. Thayer Jew , who verbally acknowledged these results. Electronically Signed   By: Jeb Levering M.D.   On: 11/25/2014 04:24   I have personally reviewed and evaluated these images and lab results as  part of my medical decision-making.   EKG Interpretation None      MDM   Final diagnoses:  Abnormal CT of the abdomen  Ovarian torsion    Patient presents with abdominal pain and vomiting. Onset approximate 6 hours ago. Nontoxic on exam. Initial vital signs notable for temperature 100.5 and a blood pressure of 212/143. She has not taken her nightly medications. She has lower abdominal tenderness most significantly right lower quadrant. No rebound or guarding.  Basic labwork obtained and patient given pain and nausea medication. Initial lab work notable for leukocytosis to 14.7. No evidence of urinary tract infection. On recheck after pain medication, patient continues to have tenderness in the lower abdomen. While it would be somewhat atypical for appendicitis to occur so quickly, given fever and white count, will obtain CT. CT scan is concerning for left ovarian enlargement and torsion. Ultrasound obtained and shows limited blood flow  concerning for intermittent torsion. Discussed the results with the patient. Discussed the patient with Dr. Graylon Gunning.  She is accepted the patient in transfer to [facility] hospital in anticipation for surgery.  After history, exam, and medical workup I feel the patient has been appropriately medically screened and is safe for discharge home. Pertinent diagnoses were discussed with the patient. Patient was given return precautions.   I personally performed the services described in this documentation, which was scribed in my presence. The recorded information has been reviewed and is accurate.    Merryl Hacker, MD 11/25/14 [phone]

## 2014-11-25 ENCOUNTER — Inpatient Hospital Stay (HOSPITAL_COMMUNITY): Payer: Self-pay | Admitting: Anesthesiology

## 2014-11-25 ENCOUNTER — Encounter (HOSPITAL_COMMUNITY)
Admission: AD | Disposition: A | Discharge status: Home / Self Care | Payer: Self-pay | Source: Ambulatory Visit | Attending: Obstetrics & Gynecology

## 2014-11-25 ENCOUNTER — Emergency Department (HOSPITAL_COMMUNITY): Payer: Self-pay

## 2014-11-25 ENCOUNTER — Encounter (HOSPITAL_COMMUNITY): Payer: Self-pay | Admitting: Obstetrics & Gynecology

## 2014-11-25 DIAGNOSIS — N83519 Torsion of ovary and ovarian pedicle, unspecified side: Secondary | ICD-10-CM

## 2014-11-25 DIAGNOSIS — Z3202 Encounter for pregnancy test, result negative: Secondary | ICD-10-CM

## 2014-11-25 DIAGNOSIS — F17210 Nicotine dependence, cigarettes, uncomplicated: Secondary | ICD-10-CM

## 2014-11-25 DIAGNOSIS — I10 Essential (primary) hypertension: Secondary | ICD-10-CM

## 2014-11-25 DIAGNOSIS — R1031 Right lower quadrant pain: Secondary | ICD-10-CM

## 2014-11-25 HISTORY — PX: LAPAROSCOPY: SHX197

## 2014-11-25 LAB — COMPREHENSIVE METABOLIC PANEL
ALBUMIN: 3.8 g/dL (ref 3.5–5.0)
ALT: 23 U/L (ref 14–54)
AST: 27 U/L (ref 15–41)
Alkaline Phosphatase: 67 U/L (ref 38–126)
Anion gap: 8 (ref 5–15)
BUN: 15 mg/dL (ref 6–20)
CO2: 24 mmol/L (ref 22–32)
Calcium: 9.2 mg/dL (ref 8.9–10.3)
Chloride: 107 mmol/L (ref 101–111)
Creatinine, Ser: 1.14 mg/dL — ABNORMAL HIGH (ref 0.44–1.00)
GFR calc Af Amer: >60 mL/min (ref >60)
GFR, EST NON AFRICAN AMERICAN: 59 mL/min — AB (ref >60)
GLUCOSE: 102 mg/dL — AB (ref 65–99)
Potassium: 3.4 mmol/L — ABNORMAL LOW (ref 3.5–5.1)
Sodium: 139 mmol/L (ref 135–145)
TOTAL PROTEIN: 6.6 g/dL (ref 6.5–8.1)
Total Bilirubin: 0.4 mg/dL (ref 0.3–1.2)

## 2014-11-25 LAB — URINALYSIS, ROUTINE W REFLEX MICROSCOPIC
Bilirubin Urine: NEGATIVE
GLUCOSE, UA: NEGATIVE mg/dL
HGB URINE DIPSTICK: NEGATIVE
Ketones, ur: NEGATIVE mg/dL
LEUKOCYTES UA: NEGATIVE
Nitrite: NEGATIVE
PH: 5.5 (ref 5.0–8.0)
Protein, ur: 100 mg/dL — AB
SPECIFIC GRAVITY, URINE: 1.021 (ref 1.005–1.030)

## 2014-11-25 LAB — TYPE AND SCREEN
ABO/RH(D): B POS
Antibody Screen: NEGATIVE

## 2014-11-25 LAB — CBC
HCT: 33 % — ABNORMAL LOW (ref 36.0–46.0)
Hemoglobin: 11.6 g/dL — ABNORMAL LOW (ref 12.0–15.0)
MCH: 30.9 pg (ref 26.0–34.0)
MCHC: 35.2 g/dL (ref 30.0–36.0)
MCV: 87.8 fL (ref 78.0–100.0)
PLATELETS: 139 10*3/uL — AB (ref 150–400)
RBC: 3.76 MIL/uL — ABNORMAL LOW (ref 3.87–5.11)
RDW: 12.8 % (ref 11.5–15.5)
WBC: 13.9 10*3/uL — ABNORMAL HIGH (ref 4.0–10.5)

## 2014-11-25 LAB — URINE MICROSCOPIC-ADD ON

## 2014-11-25 LAB — LIPASE, BLOOD: LIPASE: 30 U/L (ref 11–51)

## 2014-11-25 LAB — PREGNANCY, URINE: Preg Test, Ur: NEGATIVE

## 2014-11-25 SURGERY — LAPAROSCOPY OPERATIVE
Anesthesia: General | Site: Abdomen

## 2014-11-25 MED ORDER — PROMETHAZINE HCL 25 MG/ML IJ SOLN: 6.2500 mg | INTRAMUSCULAR | Status: DC | PRN

## 2014-11-25 MED ORDER — HYDRALAZINE HCL 20 MG/ML IJ SOLN
10.0000 mg | Freq: Once | INTRAMUSCULAR | Status: AC
  Administered 2014-11-25: 10 mg via INTRAVENOUS
  Filled 2014-11-25: qty 1

## 2014-11-25 MED ORDER — MIDAZOLAM HCL 2 MG/2ML IJ SOLN
INTRAMUSCULAR | Status: AC
  Filled 2014-11-25: qty 2

## 2014-11-25 MED ORDER — MORPHINE SULFATE (PF) 4 MG/ML IV SOLN
4.0000 mg | Freq: Once | INTRAVENOUS | Status: AC
  Administered 2014-11-25: 4 mg via INTRAVENOUS
  Filled 2014-11-25: qty 1

## 2014-11-25 MED ORDER — IBUPROFEN 600 MG PO TABS: 600.0000 mg | ORAL_TABLET | Freq: Four times a day (QID) | ORAL | Status: DC | PRN

## 2014-11-25 MED ORDER — LACTATED RINGERS IV BOLUS (SEPSIS): 1000.0000 mL | Freq: Once | INTRAVENOUS | Status: DC

## 2014-11-25 MED ORDER — NEOSTIGMINE METHYLSULFATE 10 MG/10ML IV SOLN
INTRAVENOUS | Status: AC
  Filled 2014-11-25: qty 1

## 2014-11-25 MED ORDER — SUCCINYLCHOLINE CHLORIDE 20 MG/ML IJ SOLN
INTRAMUSCULAR | Status: DC | PRN
  Administered 2014-11-25: 140 mg via INTRAVENOUS

## 2014-11-25 MED ORDER — MORPHINE SULFATE (PF) 4 MG/ML IV SOLN
4.0000 mg | Freq: Once | INTRAVENOUS | Status: AC
Start: 2014-11-25 — End: 2014-11-25
  Administered 2014-11-25: 4 mg via INTRAVENOUS
  Filled 2014-11-25: qty 1

## 2014-11-25 MED ORDER — GLYCOPYRROLATE 0.2 MG/ML IJ SOLN
INTRAMUSCULAR | Status: DC | PRN
  Administered 2014-11-25 (×2): 0.3 mg via INTRAVENOUS

## 2014-11-25 MED ORDER — LABETALOL HCL 5 MG/ML IV SOLN
20.0000 mg | Freq: Once | INTRAVENOUS | Status: AC
  Administered 2014-11-25: 20 mg via INTRAVENOUS
  Filled 2014-11-25: qty 4

## 2014-11-25 MED ORDER — ONDANSETRON HCL 4 MG/2ML IJ SOLN
INTRAMUSCULAR | Status: AC
  Filled 2014-11-25: qty 2

## 2014-11-25 MED ORDER — LACTATED RINGERS IV SOLN
INTRAVENOUS | Status: DC
  Administered 2014-11-25 (×2): via INTRAVENOUS

## 2014-11-25 MED ORDER — DOCUSATE SODIUM 100 MG PO CAPS: 100.0000 mg | ORAL_CAPSULE | Freq: Two times a day (BID) | ORAL | Status: DC | PRN

## 2014-11-25 MED ORDER — FENTANYL CITRATE (PF) 250 MCG/5ML IJ SOLN
INTRAMUSCULAR | Status: AC
  Filled 2014-11-25: qty 5

## 2014-11-25 MED ORDER — ROCURONIUM BROMIDE 100 MG/10ML IV SOLN
INTRAVENOUS | Status: DC | PRN
  Administered 2014-11-25: 10 mg via INTRAVENOUS

## 2014-11-25 MED ORDER — SUCCINYLCHOLINE CHLORIDE 20 MG/ML IJ SOLN
INTRAMUSCULAR | Status: AC
  Filled 2014-11-25: qty 1

## 2014-11-25 MED ORDER — ONDANSETRON HCL 4 MG/2ML IJ SOLN
INTRAMUSCULAR | Status: DC | PRN
  Administered 2014-11-25: 4 mg via INTRAVENOUS

## 2014-11-25 MED ORDER — DEXAMETHASONE SODIUM PHOSPHATE 10 MG/ML IJ SOLN
INTRAMUSCULAR | Status: DC | PRN
  Administered 2014-11-25: 4 mg via INTRAVENOUS

## 2014-11-25 MED ORDER — LACTATED RINGERS IR SOLN
Status: DC | PRN
  Administered 2014-11-25: 3000 mL

## 2014-11-25 MED ORDER — HYDROMORPHONE HCL 1 MG/ML IJ SOLN
2.0000 mg | Freq: Once | INTRAMUSCULAR | Status: AC
  Administered 2014-11-25: 2 mg via INTRAVENOUS
  Filled 2014-11-25: qty 2

## 2014-11-25 MED ORDER — DEXTROSE 5 % IV SOLN
2.0000 g | Freq: Once | INTRAVENOUS | Status: AC
  Administered 2014-11-25: 2 g via INTRAVENOUS
  Filled 2014-11-25: qty 2

## 2014-11-25 MED ORDER — LIDOCAINE HCL (CARDIAC) 20 MG/ML IV SOLN
INTRAVENOUS | Status: AC
  Filled 2014-11-25: qty 5

## 2014-11-25 MED ORDER — GLYCOPYRROLATE 0.2 MG/ML IJ SOLN
INTRAMUSCULAR | Status: AC
  Filled 2014-11-25: qty 2

## 2014-11-25 MED ORDER — FAMOTIDINE IN NACL 20-0.9 MG/50ML-% IV SOLN
20.0000 mg | Freq: Once | INTRAVENOUS | Status: AC
  Administered 2014-11-25: 20 mg via INTRAVENOUS
  Filled 2014-11-25: qty 50

## 2014-11-25 MED ORDER — ROCURONIUM BROMIDE 100 MG/10ML IV SOLN
INTRAVENOUS | Status: AC
  Filled 2014-11-25: qty 1

## 2014-11-25 MED ORDER — PROPOFOL 10 MG/ML IV BOLUS
INTRAVENOUS | Status: AC
  Filled 2014-11-25: qty 20

## 2014-11-25 MED ORDER — HYDROMORPHONE HCL 1 MG/ML IJ SOLN: 0.2500 mg | INTRAMUSCULAR | Status: DC | PRN

## 2014-11-25 MED ORDER — OXYCODONE-ACETAMINOPHEN 5-325 MG PO TABS
ORAL_TABLET | ORAL | Status: AC
  Administered 2014-11-25: 1
  Filled 2014-11-25: qty 1

## 2014-11-25 MED ORDER — SODIUM CHLORIDE 0.9 % IV BOLUS (SEPSIS)
1000.0000 mL | Freq: Once | INTRAVENOUS | Status: AC
  Administered 2014-11-25: 1000 mL via INTRAVENOUS

## 2014-11-25 MED ORDER — OXYCODONE-ACETAMINOPHEN 5-325 MG PO TABS: 1.0000 | ORAL_TABLET | Freq: Four times a day (QID) | ORAL | Status: DC | PRN

## 2014-11-25 MED ORDER — PROPOFOL 10 MG/ML IV BOLUS
INTRAVENOUS | Status: DC | PRN
  Administered 2014-11-25: 160 mg via INTRAVENOUS

## 2014-11-25 MED ORDER — IOHEXOL 300 MG/ML  SOLN
100.0000 mL | Freq: Once | INTRAMUSCULAR | Status: AC | PRN
  Administered 2014-11-25: 100 mL via INTRAVENOUS

## 2014-11-25 MED ORDER — LIDOCAINE HCL (CARDIAC) 20 MG/ML IV SOLN
INTRAVENOUS | Status: DC | PRN
  Administered 2014-11-25: 100 mg via INTRAVENOUS

## 2014-11-25 MED ORDER — PHENYLEPHRINE HCL 10 MG/ML IJ SOLN
INTRAMUSCULAR | Status: DC | PRN
  Administered 2014-11-25: 80 ug via INTRAVENOUS
  Administered 2014-11-25: 40 ug via INTRAVENOUS
  Administered 2014-11-25: 80 ug via INTRAVENOUS

## 2014-11-25 MED ORDER — NEOSTIGMINE METHYLSULFATE 10 MG/10ML IV SOLN
INTRAVENOUS | Status: DC | PRN
  Administered 2014-11-25 (×2): 1.5 mg via INTRAVENOUS

## 2014-11-25 MED ORDER — PHENYLEPHRINE 40 MCG/ML (10ML) SYRINGE FOR IV PUSH (FOR BLOOD PRESSURE SUPPORT)
PREFILLED_SYRINGE | INTRAVENOUS | Status: AC
  Filled 2014-11-25: qty 10

## 2014-11-25 MED ORDER — DEXAMETHASONE SODIUM PHOSPHATE 4 MG/ML IJ SOLN
INTRAMUSCULAR | Status: AC
  Filled 2014-11-25: qty 1

## 2014-11-25 MED ORDER — KETOROLAC TROMETHAMINE 30 MG/ML IJ SOLN
INTRAMUSCULAR | Status: DC | PRN
  Administered 2014-11-25: 30 mg via INTRAVENOUS

## 2014-11-25 MED ORDER — ONDANSETRON HCL 4 MG/2ML IJ SOLN
4.0000 mg | Freq: Once | INTRAMUSCULAR | Status: AC
Start: 2014-11-25 — End: 2014-11-25
  Administered 2014-11-25: 4 mg via INTRAVENOUS
  Filled 2014-11-25: qty 2

## 2014-11-25 MED ORDER — CITRIC ACID-SODIUM CITRATE 334-500 MG/5ML PO SOLN
30.0000 mL | Freq: Once | ORAL | Status: AC
  Administered 2014-11-25: 30 mL via ORAL
  Filled 2014-11-25: qty 15

## 2014-11-25 MED ORDER — BUPIVACAINE HCL (PF) 0.5 % IJ SOLN
INTRAMUSCULAR | Status: DC | PRN
  Administered 2014-11-25: 10 mL

## 2014-11-25 MED ORDER — FENTANYL CITRATE (PF) 100 MCG/2ML IJ SOLN
INTRAMUSCULAR | Status: DC | PRN
  Administered 2014-11-25: 50 ug via INTRAVENOUS

## 2014-11-25 SURGICAL SUPPLY — 28 items
BAG SPEC RTRVL LRG 6X4 10 (ENDOMECHANICALS)
CABLE HIGH FREQUENCY MONO STRZ (ELECTRODE) IMPLANT
CLOTH BEACON ORANGE TIMEOUT ST (SAFETY) ×3 IMPLANT
DILATOR CANAL MILEX (MISCELLANEOUS) ×2 IMPLANT
DRSG COVADERM PLUS 2X2 (GAUZE/BANDAGES/DRESSINGS) ×6 IMPLANT
DRSG OPSITE POSTOP 3X4 (GAUZE/BANDAGES/DRESSINGS) IMPLANT
DURAPREP 26ML APPLICATOR (WOUND CARE) ×3 IMPLANT
GLOVE BIOGEL PI IND STRL 7.0 (GLOVE) ×3 IMPLANT
GLOVE BIOGEL PI INDICATOR 7.0 (GLOVE) ×6
GLOVE ECLIPSE 7.0 STRL STRAW (GLOVE) ×3 IMPLANT
GOWN STRL REUS W/TWL LRG LVL3 (GOWN DISPOSABLE) ×9 IMPLANT
LIQUID BAND (GAUZE/BANDAGES/DRESSINGS) ×3 IMPLANT
NEEDLE INSUFFLATION 120MM (ENDOMECHANICALS) ×3 IMPLANT
NS IRRIG 1000ML POUR BTL (IV SOLUTION) ×3 IMPLANT
PACK LAPAROSCOPY BASIN (CUSTOM PROCEDURE TRAY) ×3 IMPLANT
PAD POSITIONING PINK XL (MISCELLANEOUS) ×3 IMPLANT
POUCH SPECIMEN RETRIEVAL 10MM (ENDOMECHANICALS) IMPLANT
SET IRRIG TUBING LAPAROSCOPIC (IRRIGATION / IRRIGATOR) ×2 IMPLANT
SHEARS HARMONIC ACE PLUS 36CM (ENDOMECHANICALS) ×2 IMPLANT
SUT VICRYL 0 UR6 27IN ABS (SUTURE) ×6 IMPLANT
SUT VICRYL 4-0 PS2 18IN ABS (SUTURE) ×3 IMPLANT
TOWEL OR 17X24 6PK STRL BLUE (TOWEL DISPOSABLE) ×6 IMPLANT
TRAY FOLEY CATH SILVER 14FR (SET/KITS/TRAYS/PACK) ×3 IMPLANT
TROCAR HASSON GELL 12X100 (TROCAR) ×3 IMPLANT
TROCAR XCEL NON-BLD 11X100MML (ENDOMECHANICALS) ×3 IMPLANT
TROCAR XCEL NON-BLD 5MMX100MML (ENDOMECHANICALS) ×6 IMPLANT
WARMER LAPAROSCOPE (MISCELLANEOUS) ×3 IMPLANT
WATER STERILE IRR 1000ML POUR (IV SOLUTION) IMPLANT

## 2014-11-25 NOTE — ED Notes (Signed)
MD at bedside. 

## 2014-11-25 NOTE — ED Notes (Signed)
Patient transported to CT 

## 2014-11-25 NOTE — Transfer of Care (Signed)
Immediate Anesthesia Transfer of Care Note  Patient: Katelyn [name]  Procedure(s) Performed: Procedure(s): LAPAROSCOPY OPERATIVE (N/A)  Patient Location: PACU  Anesthesia Type:General  Level of Consciousness: awake  Airway & Oxygen Therapy: Patient Spontanous Breathing  Post-op Assessment: Report given to PACU RN  Post vital signs: stable  Filed Vitals:   11/25/14 0753  BP: 133/87  Pulse: 75  Temp:   Resp: 16    Complications: No apparent anesthesia complications

## 2014-11-25 NOTE — H&P (Signed)
Faculty Practice Gynecologic and Preoperative History and Physical  Katelyn [name] is a 39 y.o. [phone] here for surgical management of suspected ovarian torsion.  She presented to [facility] ED early this morning with acute onset of 10/10 lower abdominal pain associated with nausea and vomiting. Denied any blood or bile in her vomit. She had not taken anything for relief and did not take her medication for her HTN today either. She stated that her daughter was sick and vomiting two days ago. She denied diarrhea, dysuria, hematuria, vaginal discharge, or concern for STDs. She has no other major medical problems other than her HTN. She did report a history of tubal ligation in the past.  On evaluation at [facility] ED, she was noted to have diffuse lower abdominal tenderness, no peritoneal signs.  Her BP was elevated, and she had a low grade temperature of 100.5.  Pelvic CT and ultrasound showed 5.5  cm left ovary noted to be avascular on doppler analysis concerning for torsion.  Faculty Practice was then consulted around 0430, and I recommended transfer to [facility] for surgical management.    Patient arrived around 32.  She is stable from a pain stand point, was given three doses of Morphine at [facility] ED and one dose of Zofran for nausea  Denies any other current symptoms.  Proposed surgery: Operative laparoscopy, possible unilateral ovarian cystectomy or salpingoophorectomy  Past Medical History  Diagnosis Date  . Hypertension   . Palpitations   . Chest pain   . Anemia   . Anxiety   . Depression   . Migraine    Past Surgical History  Procedure Laterality Date  . Hysteroscopy w/ endometrial ablation  2010  . Tubal ligation  2006   OB History  Gravida Para Term Preterm AB SAB TAB Ectopic Multiple Living  6 3 3  3  3   3     # Outcome Date GA Lbr Len/2nd Weight Sex Delivery Anes PTL Lv  6 TAB           5 TAB           4 TAB           3 Term      Vag-Spont     2 Term      Vag-Spont     1 Term      Vag-Spont        Patient denies any other pertinent gynecologic issues.   No current facility-administered medications on file prior to encounter.   Current Outpatient Prescriptions on File Prior to Encounter  Medication Sig Dispense Refill  . amLODipine (NORVASC) 10 MG tablet Take 1 tablet (10 mg total) by mouth daily. 90 tablet 3  . aspirin-acetaminophen-caffeine (EXCEDRIN MIGRAINE) [id number] MG per tablet Take 1 tablet by mouth every 6 (six) hours as needed for headache.    Marland Kitchen atorvastatin (LIPITOR) 40 MG tablet Take 1 tablet (40 mg total) by mouth daily. 30 tablet 3  . carvedilol (COREG) 3.125 MG tablet Take 1 tablet (3.125 mg total) by mouth 2 (two) times daily with a meal. 60 tablet 3  . lisinopril-hydrochlorothiazide (ZESTORETIC) 20-12.5 MG per tablet Take 1 tablet by mouth daily. 30 tablet 0  . sertraline (ZOLOFT) 100 MG tablet TAKE ONE TABLET BY MOUTH ONCE DAILY 30 tablet 5  . spironolactone (ALDACTONE) 50 MG tablet Take 1 tablet (50 mg total) by mouth daily. 30 tablet 1  . metroNIDAZOLE (FLAGYL) 500 MG tablet Take 1 tablet (500 mg total) by  mouth 2 (two) times daily. (Patient not taking: Reported on 07/31/2014) 14 tablet 0  . [DISCONTINUED] citalopram (CELEXA) 10 MG tablet Take 1 tablet (10 mg total) by mouth daily. 30 tablet 2  . [DISCONTINUED] omeprazole (PRILOSEC) 20 MG capsule Take 1 capsule (20 mg total) by mouth daily. 30 capsule 2  . [DISCONTINUED] potassium chloride SA (K-DUR,KLOR-CON) 20 MEQ tablet Take 1 tablet (20 mEq total) by mouth daily. For 7 days 7 tablet 0   Allergies  Allergen Reactions  . Metoprolol Nausea Only    Makes her feel very sick when taking med    Social History:   reports that she has been smoking Cigarettes.  She started smoking about 20 years ago. She has been smoking about 0.00 packs per day. She has never used smokeless tobacco. She reports that she drinks alcohol. She reports that she does not use illicit drugs.  Family History  Problem Relation Age of  Onset  . Diabetes Mother   . Depression Mother   . Depression Sister     Review of Systems: As previously mentioned  PHYSICAL EXAM: Blood pressure 157/104, pulse 79, temperature 99.2 F (37.3 C), temperature source Oral, resp. rate 16, height 5\' 8"  (1.727 m), weight 165 lb (74.844 kg), SpO2 100 %. CONSTITUTIONAL: Well-developed, well-nourished female in no acute distress.  HENT:  Normocephalic, atraumatic, External right and left ear normal. Oropharynx is clear and moist EYES: Conjunctivae and EOM are normal. Pupils are equal, round, and reactive to light. No scleral icterus.  NECK: Normal range of motion, supple, no masses SKIN: Skin is warm and dry. No rash noted. Not diaphoretic. No erythema. No pallor. [place]: Alert and oriented to person, place, and time. Normal reflexes, muscle tone coordination. No cranial nerve deficit noted. PSYCHIATRIC: Normal mood and affect. Normal behavior. Normal judgment and thought content. CARDIOVASCULAR: Normal heart rate noted, regular rhythm RESPIRATORY: Effort and breath sounds normal, no problems with respiration noted ABDOMEN: Soft, nondistended, mild-moderate diffuse lower abdominal tenderness, no rebound, no guarding PELVIC: Deferred MUSCULOSKELETAL: Normal range of motion. No edema and no tenderness. 2+ distal pulses.  Labs: Results for orders placed or performed during the hospital encounter of 11/24/14 (from the past 336 hour(s))  Urinalysis, Routine w reflex microscopic (not at [facility])   Collection Time: 11/24/14 11:18 PM  Result Value Ref Range   Color, Urine YELLOW YELLOW   APPearance CLOUDY (A) CLEAR   Specific Gravity, Urine 1.021 1.005 - 1.030   pH 5.5 5.0 - 8.0   Glucose, UA NEGATIVE NEGATIVE mg/dL   Hgb urine dipstick NEGATIVE NEGATIVE   Bilirubin Urine NEGATIVE NEGATIVE   Ketones, ur NEGATIVE NEGATIVE mg/dL   Protein, ur 100 (A) NEGATIVE mg/dL   Nitrite NEGATIVE NEGATIVE   Leukocytes, UA NEGATIVE NEGATIVE  Pregnancy, urine    Collection Time: 11/24/14 11:18 PM  Result Value Ref Range   Preg Test, Ur NEGATIVE NEGATIVE  Urine microscopic-add on   Collection Time: 11/24/14 11:18 PM  Result Value Ref Range   Squamous Epithelial / LPF 6-30 (A) NONE SEEN   WBC, UA 0-5 0 - 5 WBC/hpf   RBC / HPF 0-5 0 - 5 RBC/hpf   Bacteria, UA RARE (A) NONE SEEN  Lipase, blood   Collection Time: 11/24/14 11:32 PM  Result Value Ref Range   Lipase 30 11 - 51 U/L  Comprehensive metabolic panel   Collection Time: 11/24/14 11:32 PM  Result Value Ref Range   Sodium 139 135 - 145 mmol/L   Potassium  3.4 (L) 3.5 - 5.1 mmol/L   Chloride 107 101 - 111 mmol/L   CO2 24 22 - 32 mmol/L   Glucose, Bld 102 (H) 65 - 99 mg/dL   BUN 15 6 - 20 mg/dL   Creatinine, Ser 1.14 (H) 0.44 - 1.00 mg/dL   Calcium 9.2 8.9 - 10.3 mg/dL   Total Protein 6.6 6.5 - 8.1 g/dL   Albumin 3.8 3.5 - 5.0 g/dL   AST 27 15 - 41 U/L   ALT 23 14 - 54 U/L   Alkaline Phosphatase 67 38 - 126 U/L   Total Bilirubin 0.4 0.3 - 1.2 mg/dL   GFR calc non Af Amer 59 (L) >60 mL/min   GFR calc Af Amer >60 >60 mL/min   Anion gap 8 5 - 15  CBC   Collection Time: 11/24/14 11:32 PM  Result Value Ref Range   WBC 14.7 (H) 4.0 - 10.5 K/uL   RBC 4.29 3.87 - 5.11 MIL/uL   Hemoglobin 13.3 12.0 - 15.0 g/dL   HCT 38.0 36.0 - 46.0 %   MCV 88.6 78.0 - 100.0 fL   MCH 31.0 26.0 - 34.0 pg   MCHC 35.0 30.0 - 36.0 g/dL   RDW 12.4 11.5 - 15.5 %   Platelets 168 150 - 400 K/uL  CBC   Collection Time: 11-27-2014  6:45 AM  Result Value Ref Range   WBC 13.9 (H) 4.0 - 10.5 K/uL   RBC 3.76 (L) 3.87 - 5.11 MIL/uL   Hemoglobin 11.6 (L) 12.0 - 15.0 g/dL   HCT 33.0 (L) 36.0 - 46.0 %   MCV 87.8 78.0 - 100.0 fL   MCH 30.9 26.0 - 34.0 pg   MCHC 35.2 30.0 - 36.0 g/dL   RDW 12.8 11.5 - 15.5 %   Platelets 139 (L) 150 - 400 K/uL    Imaging Studies: US Transvaginal Non-ob  27-Nov-2014  CLINICAL DATA:  Right lower quadrant pain. Abnormal appearance of left ovary on CT. EXAM: TRANSABDOMINAL AND  TRANSVAGINAL ULTRASOUND OF PELVIS DOPPLER ULTRASOUND OF OVARIES TECHNIQUE: Both transabdominal and transvaginal ultrasound examinations of the pelvis were performed. Transabdominal technique was performed for global imaging of the pelvis including uterus, ovaries, adnexal regions, and pelvic cul-de-sac. It was necessary to proceed with endovaginal exam following the transabdominal exam to visualize the right and left ovary. Color and duplex Doppler ultrasound was utilized to evaluate blood flow to the ovaries. COMPARISON:  CT earlier this day. FINDINGS: Uterus Measurements: 8.2 x 3.5 x 5.1 cm. No fibroids or other mass visualized. Endometrium Thickness: 6 mm.  No focal abnormality visualized. Right ovary Measurements: 2.6 x 1.8 x 1.9 cm. Normal appearance/no adnexal mass. There is normal blood flow. Left ovary Measurements: 5.5 x 3.5 x 4.1 cm. The ovary appears echogenic and is avascular. The majority of the ovary demonstrates no parenchymal blood flow. There is initially some blood flow to the periphery with arterial and venous waveforms, however this is not confirmed on repeat evaluation. Pulsed Doppler evaluation of both ovaries demonstrates normal low-resistance arterial and venous waveforms to the right ovary. Other findings Trace free fluid. IMPRESSION: 1. Enlarged echogenic left ovary without definite parenchymal blood flow. There may be some blood flow to the periphery initially, however this was not confirmed on subsequent evaluation. Suspect intermittent torsion. Question of underlying solid lesion. 2. Normal appearance of the right ovary. Critical Value/emergent results were called by telephone at the time of interpretation on Nov 27, 2014 at 4:23 am to Dr. Thayer Jew ,  who verbally acknowledged these results. Electronically Signed   By: Jeb Levering M.D.   On: 11/25/2014 04:24   US Pelvis Complete  11/25/2014  CLINICAL DATA:  Right lower quadrant pain. Abnormal appearance of left ovary on CT.  EXAM: TRANSABDOMINAL AND TRANSVAGINAL ULTRASOUND OF PELVIS DOPPLER ULTRASOUND OF OVARIES TECHNIQUE: Both transabdominal and transvaginal ultrasound examinations of the pelvis were performed. Transabdominal technique was performed for global imaging of the pelvis including uterus, ovaries, adnexal regions, and pelvic cul-de-sac. It was necessary to proceed with endovaginal exam following the transabdominal exam to visualize the right and left ovary. Color and duplex Doppler ultrasound was utilized to evaluate blood flow to the ovaries. COMPARISON:  CT earlier this day. FINDINGS: Uterus Measurements: 8.2 x 3.5 x 5.1 cm. No fibroids or other mass visualized. Endometrium Thickness: 6 mm.  No focal abnormality visualized. Right ovary Measurements: 2.6 x 1.8 x 1.9 cm. Normal appearance/no adnexal mass. There is normal blood flow. Left ovary Measurements: 5.5 x 3.5 x 4.1 cm. The ovary appears echogenic and is avascular. The majority of the ovary demonstrates no parenchymal blood flow. There is initially some blood flow to the periphery with arterial and venous waveforms, however this is not confirmed on repeat evaluation. Pulsed Doppler evaluation of both ovaries demonstrates normal low-resistance arterial and venous waveforms to the right ovary. Other findings Trace free fluid. IMPRESSION: 1. Enlarged echogenic left ovary without definite parenchymal blood flow. There may be some blood flow to the periphery initially, however this was not confirmed on subsequent evaluation. Suspect intermittent torsion. Question of underlying solid lesion. 2. Normal appearance of the right ovary. Critical Value/emergent results were called by telephone at the time of interpretation on 11/25/2014 at 4:23 am to Dr. Thayer Jew , who verbally acknowledged these results. Electronically Signed   By: Jeb Levering M.D.   On: 11/25/2014 04:24   Ct Abdomen Pelvis W Contrast  11/25/2014  CLINICAL DATA:  Right lower quadrant abdominal  pain for 4 hours. EXAM: CT ABDOMEN AND PELVIS WITH CONTRAST TECHNIQUE: Multidetector CT imaging of the abdomen and pelvis was performed using the standard protocol following bolus administration of intravenous contrast. CONTRAST:  113mL OMNIPAQUE IOHEXOL 300 MG/ML  SOLN COMPARISON:  CT 06/04/2012 FINDINGS: Lower chest: The included lung bases are clear. The heart is normal in size. Liver: No focal lesion. Fatty infiltration adjacent with falciform ligament. There is minimal periportal edema. Hepatobiliary: Gallbladder physiologically distended, no calcified stone. Common bile duct is normal in caliber. Pancreas: No ductal dilatation or inflammation. Spleen: Normal. Adrenal glands: No nodule. Kidneys: Symmetric renal enhancement. No hydronephrosis. 5 mm hypodensity in the lower right kidney, too small to accurately characterize. Stomach/Bowel: Stomach is decompressed. There are no dilated or thickened small bowel loops. Moderate volume of stool throughout the colon without colonic wall thickening. The appendix is normal. Vascular/Lymphatic: No retroperitoneal adenopathy. Abdominal aorta is normal in caliber. Reproductive: Left ovary is enlarged measuring 5.9 x 3.5 cm in medially deviated. Right ovary tentatively identified and normal in size. Tubal ligation clips are noted. The uterus appears normal. There is free fluid in the pelvis. Bladder: Minimally distended not well evaluated. Other: No free air, upper abdominal free fluid, or intra-abdominal fluid collection. Musculoskeletal: There are no acute or suspicious osseous abnormalities. Progressive degenerative change at L5-S1. IMPRESSION: 1. Left ovarian enlargement with medial deviation. Recommend pelvic ultrasound for further evaluation and to exclude torsion. 2. Normal appendix. These results were called by telephone at the time of interpretation on 11/25/2014 at 3:12  am to Dr. Thayer Jew , who verbally acknowledged these results. Electronically Signed    By: Jeb Levering M.D.   On: 11/25/2014 03:12   [place] Art/ven Flow Abd Pelv Doppler  11/25/2014  CLINICAL DATA:  Right lower quadrant pain. Abnormal appearance of left ovary on CT. EXAM: TRANSABDOMINAL AND TRANSVAGINAL ULTRASOUND OF PELVIS DOPPLER ULTRASOUND OF OVARIES TECHNIQUE: Both transabdominal and transvaginal ultrasound examinations of the pelvis were performed. Transabdominal technique was performed for global imaging of the pelvis including uterus, ovaries, adnexal regions, and pelvic cul-de-sac. It was necessary to proceed with endovaginal exam following the transabdominal exam to visualize the right and left ovary. Color and duplex Doppler ultrasound was utilized to evaluate blood flow to the ovaries. COMPARISON:  CT earlier this day. FINDINGS: Uterus Measurements: 8.2 x 3.5 x 5.1 cm. No fibroids or other mass visualized. Endometrium Thickness: 6 mm.  No focal abnormality visualized. Right ovary Measurements: 2.6 x 1.8 x 1.9 cm. Normal appearance/no adnexal mass. There is normal blood flow. Left ovary Measurements: 5.5 x 3.5 x 4.1 cm. The ovary appears echogenic and is avascular. The majority of the ovary demonstrates no parenchymal blood flow. There is initially some blood flow to the periphery with arterial and venous waveforms, however this is not confirmed on repeat evaluation. Pulsed Doppler evaluation of both ovaries demonstrates normal low-resistance arterial and venous waveforms to the right ovary. Other findings Trace free fluid. IMPRESSION: 1. Enlarged echogenic left ovary without definite parenchymal blood flow. There may be some blood flow to the periphery initially, however this was not confirmed on subsequent evaluation. Suspect intermittent torsion. Question of underlying solid lesion. 2. Normal appearance of the right ovary. Critical Value/emergent results were called by telephone at the time of interpretation on 11/25/2014 at 4:23 am to Dr. Thayer Jew , who verbally acknowledged  these results. Electronically Signed   By: Jeb Levering M.D.   On: 11/25/2014 04:24    Assessment: Principal Problem:   Ovarian torsion Active Problems:   CIGARETTE SMOKER   Severe hypertension   Abdominal pain, RLQ   Plan: Patient will undergo surgical management with operative laparoscopy, possible unilateral ovarian cystectomy or salpingoophorectomy for definitive management as she has a history of tubal ligation and is done with childbearing.   The risks of surgery were discussed in detail with the patient including but not limited to: bleeding which may require transfusion or reoperation; infection which may require antibiotics; injury to surrounding organs which may involve bowel, bladder, ureters; need for additional procedures including laparotomy; thromboembolic phenomenon, surgical site problems and other postoperative/anesthesia complications. Likelihood of success in alleviating the patient's condition was discussed. If procedure is uncomplicated and patient is stable, she will go home today. Routine postoperative instructions will be reviewed with the patient and her family in detail after surgery.  The patient concurred with the proposed plan, giving informed written consent for the surgery.  Patient has been NPO since last night she will remain NPO for procedure.  Anesthesia, House Coverage RN and OR aware.  Preoperative prophylactic antibiotics and SCDs ordered on call to the OR.  To OR when ready.   Verita Schneiders, M.D. 11/25/2014 7:02 AM

## 2014-11-25 NOTE — Anesthesia Procedure Notes (Signed)
Procedure Name: Intubation Date/Time: 11/25/2014 8:11 AM Performed by: Casimer Lanius A Pre-anesthesia Checklist: Patient identified, Emergency Drugs available, Suction available and Patient being monitored Patient Re-evaluated:Patient Re-evaluated prior to inductionOxygen Delivery Method: Circle system utilized Preoxygenation: Pre-oxygenation with 100% oxygen Intubation Type: IV induction, Rapid sequence and Cricoid Pressure applied Laryngoscope Size: Mac and 3 Grade View: Grade II Tube type: Oral Tube size: 7.0 mm Number of attempts: 1 Airway Equipment and Method: Stylet Placement Confirmation: ETT inserted through vocal cords under direct vision,  positive ETCO2 and breath sounds checked- equal and bilateral Secured at: 22 cm Tube secured with: Tape Dental Injury: Teeth and Oropharynx as per pre-operative assessment

## 2014-11-25 NOTE — Discharge Instructions (Addendum)
Laparoscopic Surgery - Care After Laparoscopy is a surgical procedure. It is used to diagnose and treat diseases inside the belly(abdomen). It is usually a brief, common, and relatively simple procedure. The laparoscopeis a thin, lighted, pencil-sized instrument. It is like a telescope. It is inserted into your abdomen through a small cut (incision). Your caregiver can look at the organs inside your body through this instrument.  She can see if there is anything abnormal. Laparoscopy can be done either in a hospital or outpatient clinic. You may be given a mild sedative to help you relax before the procedure. Once in the operating room, you will be given a drug to make you sleep (general anesthesia). Laparoscopy usually lasts about 1 hour. After the procedure, you will be monitored in a recovery area until you are stable and doing well. Once you are home, it may take 3 to 7 days to fully recover.  Laparoscopy has relatively few risks. Your caregiver will discuss the risks with you before the procedure. Some problems that can occur include: RISKS AND COMPLICATIONS  Allergies to medicines. Difficulty breathing. Bleeding. Infection. Damage to other surrounding structures HOME CARE INSTRUCTIONS  Infection. Bleeding. Damage to other organs. Anesthetic side effects.  Need for additional procedures such as open procedures/laparotomy PROCEDURE Once you receive anesthesia, your surgeon inflates the abdomen with a harmless gas (carbon dioxide). This makes the organs easier to see. The laparoscope is inserted into the abdomen through a small incision. This allows your surgeon to see into the abdomen. Other small instruments are also inserted into the abdomen through other small openings. Many surgeons attach a video camera to the laparoscope to enlarge the view. During a laparoscopy, the surgeon may be looking for inflammation, infection, or cancer.  The surgeon may also need to take out certain organs or  take tissue samples (biopsies). The specimens are sent to a specialist in looking at cells and tissue samples (pathologist). The pathologist examines them under a microscope to help to diagnose or confirm a disease. AFTER THE PROCEDURE  The incisions are closed with stitches (sutures) and Dermabond. Because these incisions are small (usually less than 1/2 inch), there is usually minimal discomfort after the procedure. There may also be discomfort from the instrument placement incisions in the abdomen. You will be given pain medicine to ease any discomfort. You will rest in a recovery room for 1-2 hours until you are stable and doing well. You may have some mild discomfort in the throat. This is from the tube placed in your throat while you were sleeping. You may experience discomfort in the shoulder area from some trapped air between the liver and diaphragm. This sensation is normal and will slowly go away on its own. The recovery time is shortened as long as there are no complications. You will rest in a recovery room until stable and doing well. As long as there are no complications, you may be allowed to go home. Someone will need to drive you home and be with you for at least 24 hours once home. FINDING OUT THE RESULTS You will be called with the results of the pathology and will discuss these results with  your caregiver during your postoperative appointment. Do not assume everything is normal if you have not heard from your caregiver or the medical facility. It is important for you to follow up on all of your results. HOME CARE INSTRUCTIONS  Take all medicines as directed. Only take over-the-counter or prescription medicines for pain, discomfort,   CARE INSTRUCTIONS   Take all medicines as directed.  Only take over-the-counter or prescription medicines for pain, discomfort, or fever as directed by your caregiver.  Resume daily activities as directed.  Showers are preferred over baths.  You may resume sexual activities in 1 week or as directed.  Do not drive while taking  narcotics. SEEK MEDICAL CARE IF:  There is increasing abdominal pain.  You feel lightheaded or faint.  You have the chills.  You have an oral temperature above 102 F (38.9 C).  There is pus-like (purulent) drainage from any of the wounds.  You are unable to pass gas or have a bowel movement.  You feel sick to your stomach (nauseous) or throw up (vomit). MAKE SURE YOU:   Understand these instructions.  Will watch your condition.  Will get help right away if you are not doing well or get worse.  ExitCare Patient Information 2013 [place].    Ovarian Cystectomy, Care After Refer to this sheet in the next few weeks. These instructions provide you with information on caring for yourself after your procedure. Your health care provider may also give you more specific instructions. Your treatment has been planned according to current medical practices, but problems sometimes occur. Call your health care provider if you have any problems or questions after your procedure.  WHAT TO EXPECT AFTER THE PROCEDURE After your procedure, it is typical to have the following:  Pain in your abdomen, especially at the incision sites. You will be given pain medicines to control the pain.  Tiredness. This is a normal part of the recovery process. Your energy level will return to normal over the next several weeks.  Constipation. HOME CARE INSTRUCTIONS  Only take over-the-counter or prescription medicines as directed by your health care provider. Avoid taking aspirin because it can cause bleeding.   Follow your health care provider's instructions for when to resume your regular diet, exercise, and activities.   Take rest breaks during the day as needed.  Do not douche or have sexual intercourse until you have permission from your health care provider.   Remove or change any bandages (dressings) as directed by your health care provider.   Do not drive until your health care  provider approves.   Take showers instead of baths until your health care provider tells you otherwise.   If you become constipated, you may:   Use a mild laxative if your health care provider approves.  Add more fruit and bran to your diet.  Drink more fluids.  Take your temperature twice a day and record it.   Do not drink alcohol while taking pain medicine.   Try to have someone home with you for the first 1-2 weeks to help with your household activities.   Follow up with your health care provider as directed. SEEK MEDICAL CARE IF:  You have a fever.   You feel sick to your stomach (nauseous) and throw up (vomit).   You have redness, swelling, or leakage of fluid at the incision site.   You have pain when you urinate or have blood in your urine.   You have a rash on your body.   You have pain or redness where the IV tube was inserted.   You have pain that is not relieved with medicine.  SEEK IMMEDIATE MEDICAL CARE IF:  You have chest pain or shortness of breath.   You feel dizzy or lightheaded.   You have increasing abdominal pain that  is not relieved with medicines.   You have pain, swelling, or redness in your leg.   You see a yellowish white fluid (pus) coming from the incision.   Your incision is opening (edges not staying together).    This information is not intended to replace advice given to you by your health care provider. Make sure you discuss any questions you have with your health care provider.   Document Released: 10/13/2012 Document Reviewed: 10/13/2012 Elsevier Interactive Patient Education 2016 [place] INSTRUCTIONS: Laparoscopy  The following instructions have been prepared to help you care for yourself upon your return home today.  Wound care:  Do not get the incision wet for the first 24 hours. The incision should be kept clean and dry.  The Band-Aids or dressings may be removed the day after  surgery.  Should the incision become sore, red, and swollen after the first week, check with your doctor.  Personal hygiene:  Shower the day after your procedure.  Activity and limitations:  Do NOT drive or operate any equipment today.  Do NOT lift anything more than 15 pounds for 2-3 weeks after surgery.  Do NOT rest in bed all day.  Walking is encouraged. Walk each day, starting slowly with 5-minute walks 3 or 4 times a day. Slowly increase the length of your walks.  Walk up and down stairs slowly.  Do NOT do strenuous activities, such as golfing, playing tennis, bowling, running, biking, weight lifting, gardening, mowing, or vacuuming for 2-4 weeks. Ask your doctor when it is okay to start.  Diet: Eat a light meal as desired this evening. You may resume your usual diet tomorrow.  Return to work: This is dependent on the type of work you do. For the most part you can return to a desk job within a week of surgery. If you are more active at work, please discuss this with your doctor.  What to expect after your surgery: You may have a slight burning sensation when you urinate on the first day. You may have a very small amount of blood in the urine. Expect to have a small amount of vaginal discharge/light bleeding for 1-2 weeks. It is not unusual to have abdominal soreness and bruising for up to 2 weeks. You may be tired and need more rest for about 1 week. You may experience shoulder pain for 24-72 hours. Lying flat in bed may relieve it.  Call your doctor for any of the following:  Develop a fever of 100.4 or greater  Inability to urinate 6 hours after discharge from hospital  Severe pain not relieved by pain medications  Persistent of heavy bleeding at incision site  Redness or swelling around incision site after a week  Increasing nausea or vomiting  Patient Signature________________________________________ Nurse Signature_________________________________________

## 2014-11-25 NOTE — MAU Note (Signed)
PT  SAYS SHE WENT  TO [facility] LAST NIGHT AT 10PM  FOR  ABD PAIN-   DID   TEST  AND  TRANSFERRED  HER  HERE.     NO VAG  BLEEDING.     NO BIRTH CONTROL  - LAST  SEX-  Sunday.

## 2014-11-25 NOTE — ED Notes (Signed)
Call OB per Dr. Dina Rich

## 2014-11-25 NOTE — Anesthesia Preprocedure Evaluation (Addendum)
Anesthesia Evaluation  Patient identified by MRN, date of birth, ID band Patient awake    Reviewed: Allergy & Precautions, NPO status , Patient's Chart, lab work & pertinent test results, reviewed documented beta blocker date and time   Airway Mallampati: II  TM Distance: >3 FB Neck ROM: Full    Dental no notable dental hx. (+) Teeth Intact   Pulmonary Current Smoker,    Pulmonary exam normal breath sounds clear to auscultation       Cardiovascular hypertension, Pt. on medications Normal cardiovascular exam Rhythm:Regular  Patient with poorly controlled hypertension on 4 medications   Neuro/Psych  Headaches, Anxiety Depression    GI/Hepatic   Endo/Other    Renal/GU      Musculoskeletal   Abdominal   Peds  Hematology  (+) anemia ,   Anesthesia Other Findings NPO appropriate Denies cardiac or pulmonary issues  Reproductive/Obstetrics                            Anesthesia Physical Anesthesia Plan  ASA: III and emergent  Anesthesia Plan: General   Post-op Pain Management:    Induction: Intravenous  Airway Management Planned: Oral ETT  Additional Equipment:   Intra-op Plan:   Post-operative Plan: Extubation in OR  Informed Consent: I have reviewed the patients History and Physical, chart, labs and discussed the procedure including the risks, benefits and alternatives for the proposed anesthesia with the patient or authorized representative who has indicated his/her understanding and acceptance.   Dental advisory given  Plan Discussed with: Anesthesiologist and CRNA  Anesthesia Plan Comments:        Anesthesia Quick Evaluation

## 2014-11-25 NOTE — Op Note (Signed)
[name] PROCEDURE DATE: 11/25/2014  PREOPERATIVE DIAGNOSES: Suspected ovarian torsion POSTOPERATIVE DIAGNOSES: The same PROCEDURE: Laparoscopic left ovarian cystectomy SURGEON:  Dr. Verita Schneiders ASSISTANT: Dr. Mora Bellman  INDICATIONS: 39 y.o. [id number] with aforementioned preoperative diagnoses here today for definitive surgical management.   Risks of surgery were discussed with the patient including but not limited to: bleeding which may require transfusion or reoperation; infection which may require antibiotics; injury to bowel, bladder, ureters or other surrounding organs; need for additional procedures including laparotomy; thromboembolic phenomenon, incisional problems and other postoperative/anesthesia complications. Written informed consent was obtained.    FINDINGS:  Small uterus, left adnexa with a 3 cm hemorrhagic cyst. Normal right adnexa. Approximately 50 cc of hemoperitoneum was noted upon entry of the abdomen. No evidence of endometriosis. Adhesions were noted involving the cecum to the pelvic side wall. Omental adhesions were also noted at the level of the umbilicus to the anterior abdominal wall. The right Filshie clip was visualized on the right fallopian tube. The left Filshie clip was embedded in scar tissue on the anterior surface of the uterus. Normal upper abdomen.  ANESTHESIA:    General INTRAVENOUS FLUIDS: 1000 ml ESTIMATED BLOOD LOSS: 50 ml URINE OUTPUT: 250 cc SPECIMENS:  leftovarian cyst COMPLICATIONS: None immediate   PROCEDURE IN DETAIL:  The patient received intravenous antibiotics and had sequential compression devices applied to her lower extremities while in the preoperative area.  She was then taken to the operating room where general anesthesia was administered and was found to be adequate.  She was placed in the dorsal lithotomy position, and was prepped and draped in a sterile manner.  A Foley catheter was inserted into her bladder and attached  to Medha Pippen drainage and a uterine manipulator could not be inserted secondary to cervical stenosis. Instead, a moist sponge stick  was advanced into the vagina. After an adequate timeout was performed, attention was then turned to the patient's abdomen where a 11-mm skin incision was made in the umbilical fold.  The underlying fascia was identified, grasped with Kocher clamps, tented up and entered sharply with mayo scissors. The fascia was tagged with 0-Vicryl. The peritoneum was entered bluntly.The 11 mm trocar and sleeve were introduced into the abdominal cavityl.  Intraperitoneal placement was confirmed with the use of the laparoscope. Pneumoperitoneum was achieved by the insufflation of CO2 gas. A survey of the patient's pelvis and abdomen revealed the above noted findings. Bilateral 5-mm lower quadrant ports were then placed under direct visualization. On the left side, the left ovary appeared completely normal without evidence of torsion. A hemorrhagic cyst was visualized. Using the Harmonic scalpel, an ovarian cystectomy was performed. Excellent hemostasis was noted.  The specimen was then removed from the abdomen through the 11-mm port using an Endocatch bag, under direct visualization.  The omental adhesions were lysed using the harmonic scalpel. The operative sites was surveyed, and it was found to be hemostatic.  No intraoperative injury to other surrounding organs was noted.  The abdomen was desufflated and all instruments were then removed from the patient's abdomen. The fascial incision of the umbilicus was closed with a 0 Vicryl figure of eight stitch.  All skin incisions were closed with 3-0 Vicryl subcuticular stitches/Dermabond.   The patient will be discharged to home as per PACU criteria.  Routine postoperative instructions given.  She was prescribed Percocet, Ibuprofen and Colace.  She will follow up in the clinic in 2 weeks for postoperative evaluation.

## 2014-11-25 NOTE — Anesthesia Postprocedure Evaluation (Signed)
  Anesthesia Post-op Note  Patient: Katelyn [name]  Procedure(s) Performed: Procedure(s) (LRB): LAPAROSCOPY OPERATIVE (N/A)  Patient Location: PACU  Anesthesia Type: General  Level of Consciousness: awake and alert   Airway and Oxygen Therapy: Patient Spontanous Breathing  Post-op Pain: mild  Post-op Assessment: Post-op Vital signs reviewed, Patient's Cardiovascular Status Stable, Respiratory Function Stable, Patent Airway and No signs of Nausea or vomiting  Last Vitals:  Filed Vitals:   11/25/14 1030  BP: 134/83  Pulse: 72  Temp:   Resp: 16    Post-op Vital Signs: stable   Complications: No apparent anesthesia complications

## 2014-11-28 ENCOUNTER — Encounter (HOSPITAL_COMMUNITY): Payer: Self-pay | Admitting: Obstetrics & Gynecology

## 2014-12-13 ENCOUNTER — Ambulatory Visit: Payer: Self-pay | Admitting: Obstetrics & Gynecology

## 2014-12-15 ENCOUNTER — Ambulatory Visit: Payer: Self-pay | Admitting: Obstetrics & Gynecology

## 2014-12-15 ENCOUNTER — Encounter: Payer: Self-pay | Admitting: Obstetrics & Gynecology

## 2014-12-15 VITALS — BP 237/144 | HR 83 | Temp 98.3°F | Wt 163.5 lb

## 2014-12-15 DIAGNOSIS — Z9889 Other specified postprocedural states: Secondary | ICD-10-CM

## 2014-12-15 NOTE — Progress Notes (Signed)
   Subjective:    Patient ID: Katelyn [name], female    DOB: 1975/12/06, 39 y.o.   MRN: [id number]  HPI  39 yo AA woman here about 3 weeks post op s/p laparoscopic for ovarian torsion. She is doing well with no complaints except for some itching at her umbilical incision. She has had sex without dyspareunia.  Review of Systems Pap and mammogram due    Objective:   Physical Exam [id number] Breathing, conversing, and ambulating normally Incisions- all healing well Abd- benign       Assessment & Plan:  Post op- doing well Pap smear at free clinic Fill out application for free mammogram

## 2014-12-28 ENCOUNTER — Ambulatory Visit (INDEPENDENT_AMBULATORY_CARE_PROVIDER_SITE_OTHER): Payer: Self-pay | Admitting: Family Medicine

## 2014-12-28 ENCOUNTER — Encounter: Payer: Self-pay | Admitting: Family Medicine

## 2014-12-28 VITALS — BP 243/149 | HR 83 | Temp 98.2°F | Ht 68.0 in | Wt 163.2 lb

## 2014-12-28 DIAGNOSIS — N63 Unspecified lump in unspecified breast: Secondary | ICD-10-CM | POA: Insufficient documentation

## 2014-12-28 NOTE — Patient Instructions (Signed)
PATIENT INSTRUCTIONS FIBROCYSTIC BREAST DISEASE  CAUSE:  Many women have some lumpiness within their breasts and these areas at times can become tender during certain times in your menstrual cycle.  These areas tend to feel like a firm rubber ball as compared to a cancer which will more commonly feel hard and almost rock-like.  Fibrocystic breast disease does not in and of itself increase your risk for breast cancer but you should be sure to examine your breasts at the same time of the month on a monthly basis.  If there are a lot of areas of lumpiness you should tape a piece of paper on the mirror with a diagram of your breasts, noting where the areas of lumpiness are and their relative size.  You can then refer to this diagram on a monthly basis to keep better track of any changes should they occur.  DIET:  You should try and avoid foods, or at least minimize foods, such as chocolate and caffeine which may cause the symptoms of tenderness to become worse.  ACTIVITY:  You may want to wear a bra that offers additional support, and/or consider a sports bra, especially during those times when your breasts are more tender.  MEDICATIONS:  Taking Vitamin E capsules twice a day along with Evening of Primrose Capsules three times a day, or as directed on the bottle, may help your symptoms.  These are both available over-the-counter and without a prescription. There is clinical evidence that these may help symptoms in some patients.   If your physician has prescribed medication for your fibrocystic breast disease, be sure to take it as instructed on the bottle and let him/her know if you have any side effects.  QUESTIONS:  Please feel free to call your physician  if you have any questions, and they will be glad to assist you.

## 2014-12-29 NOTE — Assessment & Plan Note (Signed)
Lump in right breast at 11 O'clock, 1x2cm, rubbery and mobile, likely fibrocystic changes, no family history of breast cancer - mammogram ordered - patient to apply for scholarship for uninsured mammo, contact info given

## 2014-12-29 NOTE — Progress Notes (Signed)
   Subjective:   Katelyn [name] is a 39 y.o. female with a history of anxiety here for lump in breast.   3 days ago patient noted lump in her breast that was somewhat tender to the touch. No personal or family history of breast cancer. Reports several family members with cysts in breasts that have been removed. No change since first noted.  Review of Systems:  Per HPI. All other systems reviewed and are negative.   PMH, PSH, Medications, Allergies, and FmHx reviewed and updated in EMR.  Social History: current smoker  Objective:  BP 243/149 mmHg  Pulse 83  Temp(Src) 98.2 F (36.8 C) (Oral)  Ht 5\' 8"  (1.727 m)  Wt 163 lb 3.2 oz (74.027 kg)  BMI 24.82 kg/m2  Gen:  39 y.o. female in NAD HEENT: NCAT, MMM, EOMI, PERRL, anicteric sclerae CV: RRR, no MRG, no JVD Breast: 1x2cm mobile rubbery nodule Resp: Non-labored, CTAB, no wheezes noted Abd: Soft, NTND, BS present, no guarding or organomegaly Ext: WWP, no edema MSK: Full ROM, strength intact Neuro: Alert and oriented, speech normal      Chemistry      Component Value Date/Time   NA 139 11/24/2014 2332   K 3.4* 11/24/2014 2332   CL 107 11/24/2014 2332   CO2 24 11/24/2014 2332   BUN 15 11/24/2014 2332   CREATININE 1.14* 11/24/2014 2332   CREATININE 0.93 06/27/2014 1040      Component Value Date/Time   CALCIUM 9.2 11/24/2014 2332   ALKPHOS 67 11/24/2014 2332   AST 27 11/24/2014 2332   ALT 23 11/24/2014 2332   BILITOT 0.4 11/24/2014 2332      Lab Results  Component Value Date   WBC 13.9* 11/25/2014   HGB 11.6* 11/25/2014   HCT 33.0* 11/25/2014   MCV 87.8 11/25/2014   PLT 139* 11/25/2014   Lab Results  Component Value Date   TSH 0.944 06/02/2012   Lab Results  Component Value Date   HGBA1C 5.2 01/30/2014   Assessment & Plan:     Katelyn [name] is a 39 y.o. female here for breast lump  Breast lump in upper outer quadrant Lump in right breast at 11 O'clock, 1x2cm, rubbery and mobile, likely  fibrocystic changes, no family history of breast cancer - mammogram ordered - patient to apply for scholarship for uninsured mammo, contact info given    Beverlyn Roux, MD, MPH [facility] PGY-3 12/29/2014 2:12 PM

## 2015-01-09 ENCOUNTER — Other Ambulatory Visit (HOSPITAL_COMMUNITY): Payer: Self-pay | Admitting: *Deleted

## 2015-01-09 DIAGNOSIS — N644 Mastodynia: Secondary | ICD-10-CM

## 2015-01-09 DIAGNOSIS — N6310 Unspecified lump in the right breast, unspecified quadrant: Secondary | ICD-10-CM

## 2015-01-10 ENCOUNTER — Telehealth (HOSPITAL_COMMUNITY): Payer: Self-pay | Admitting: *Deleted

## 2015-01-10 NOTE — Telephone Encounter (Signed)
Telephoned patient at home # and left message reminding patient of appointment with [facility] on Jan 5 8:30

## 2015-01-11 ENCOUNTER — Ambulatory Visit
Admission: RE | Admit: 2015-01-11 | Discharge: 2015-01-11 | Disposition: A | Discharge status: Home / Self Care | Payer: No Typology Code available for payment source | Source: Ambulatory Visit | Attending: Obstetrics and Gynecology | Admitting: Obstetrics and Gynecology

## 2015-01-11 ENCOUNTER — Ambulatory Visit (HOSPITAL_COMMUNITY)
Admission: RE | Admit: 2015-01-11 | Discharge: 2015-01-11 | Disposition: A | Discharge status: Home / Self Care | Payer: Self-pay | Source: Ambulatory Visit | Attending: Obstetrics and Gynecology | Admitting: Obstetrics and Gynecology

## 2015-01-11 ENCOUNTER — Ambulatory Visit (INDEPENDENT_AMBULATORY_CARE_PROVIDER_SITE_OTHER): Payer: Self-pay | Admitting: Family Medicine

## 2015-01-11 ENCOUNTER — Encounter (HOSPITAL_COMMUNITY): Payer: Self-pay

## 2015-01-11 ENCOUNTER — Encounter: Payer: Self-pay | Admitting: Family Medicine

## 2015-01-11 VITALS — BP 222/148 | Temp 98.0°F | Ht 69.0 in | Wt 167.0 lb

## 2015-01-11 DIAGNOSIS — N6315 Unspecified lump in the right breast, overlapping quadrants: Secondary | ICD-10-CM

## 2015-01-11 DIAGNOSIS — N898 Other specified noninflammatory disorders of vagina: Secondary | ICD-10-CM

## 2015-01-11 DIAGNOSIS — N6310 Unspecified lump in the right breast, unspecified quadrant: Secondary | ICD-10-CM

## 2015-01-11 DIAGNOSIS — N644 Mastodynia: Secondary | ICD-10-CM

## 2015-01-11 DIAGNOSIS — N6320 Unspecified lump in the left breast, unspecified quadrant: Secondary | ICD-10-CM

## 2015-01-11 DIAGNOSIS — Z01419 Encounter for gynecological examination (general) (routine) without abnormal findings: Secondary | ICD-10-CM

## 2015-01-11 DIAGNOSIS — I10 Essential (primary) hypertension: Secondary | ICD-10-CM

## 2015-01-11 DIAGNOSIS — N6325 Unspecified lump in the left breast, overlapping quadrants: Secondary | ICD-10-CM

## 2015-01-11 MED ORDER — SPIRONOLACTONE 25 MG PO TABS: 25.0000 mg | ORAL_TABLET | Freq: Every day | ORAL | Status: DC

## 2015-01-11 MED ORDER — LISINOPRIL-HYDROCHLOROTHIAZIDE 20-12.5 MG PO TABS: 1.0000 | ORAL_TABLET | Freq: Every day | ORAL | Status: DC

## 2015-01-11 MED ORDER — AMLODIPINE BESYLATE 10 MG PO TABS: 10.0000 mg | ORAL_TABLET | Freq: Every day | ORAL | Status: DC

## 2015-01-11 MED ORDER — CARVEDILOL 6.25 MG PO TABS: 6.2500 mg | ORAL_TABLET | Freq: Two times a day (BID) | ORAL | Status: DC

## 2015-01-11 NOTE — Addendum Note (Signed)
Encounter addended by: Loletta Parish, RN on: 01/11/2015 10:28 AM<BR>     Documentation filed: Charges VN

## 2015-01-11 NOTE — Progress Notes (Signed)
Complaints of right breast lump x 3 weeks. Patient stated was painful and that the pain has resolved. Patient states the lump has decreased in size since first noticing.  Pap Smear: Pap smear completed today. Last Pap smear was 03/20/2011 at [facility] and normal. Per patient has no history of an abnormal Pap smear. Last Pap smear result is in EPIC.  Physical exam: Breasts Breasts symmetrical. No skin abnormalities bilateral breasts. No nipple retraction bilateral breasts. No nipple discharge bilateral breasts. No lymphadenopathy. No lumps palpated bilateral breasts. No complaints of pain or tenderness on exam. Referred patient to the [facility] for a diagnostic mammogram. Appointment scheduled for Thursday, January 11, 2015 at 1240.  Pelvic/Bimanual   Ext Genitalia No lesions, no swelling and no discharge observed on external genitalia.         Vagina Vagina pink and normal texture. No lesions and thick white vaginal discharge observed in vagina. Wet prep completed.       Cervix Cervix is present. Cervix pink and of normal texture. Thick white vaginal discharge observed at cervical os on exam.    Uterus Uterus is present and palpable. Uterus in normal position and normal size.        Adnexae Bilateral ovaries present and palpable. No tenderness on palpation.          Rectovaginal No rectal exam completed today since patient had no rectal complaints. No skin abnormalities observed on exam.    Smoking History: Smoking cessation discussed. Referred patient to the [facility] Quitline and gave resources to free classes offered at the [facility].  Patient Navigation: Patient education provided. Access to services provided for patient through [facility] program. Referred patient to [facility] to follow up for high blood pressure of 222/148. Family Practice working patient in and told me to send patient over after appointment. Patient advised to go  straight to [facility] following appointment.

## 2015-01-11 NOTE — Addendum Note (Signed)
Encounter addended by: Loletta Parish, RN on: 01/11/2015 10:53 AM<BR>     Documentation filed: Patient Instructions Section

## 2015-01-11 NOTE — Addendum Note (Signed)
Encounter addended by: Armond Hang, LPN on: [id number]  [id number] PM<BR>     Documentation filed: [employer], Orders

## 2015-01-11 NOTE — Patient Instructions (Addendum)
Educational materials on self breast awareness given. Explained to  [name] that [facility]CP will cover Pap smears and HPV typing every 5 years unless has a history of abnormal Pap smears. Referred patient to the [facility] for a diagnostic mammogram. Appointment scheduled for Thursday, January 11, 2015 at 1240. Referred patient to [facility] for high blood pressure. Told patient to go to [facility] following [facility] appointment. Patient aware of appointment and will be there. Let patient know will follow up with her within the next couple weeks with results of Pap smear and wet prep by phone. Smoking cessation discussed. Referred patient to the [facility] Quitline and gave resources to free classes offered at the [facility]. [name] verbalized understanding.  Lizzie An, Arvil Chaco, RN 10:21 AM

## 2015-01-11 NOTE — Patient Instructions (Signed)
I sent in prescription for 3 four dollar meds Lisinopril/HCTZ, spironolactone and carvedilol.  Total $12 - Make sure you get right away today. I also sent a prescription for amlodipine which is not a $4 drug.  I know we can get that at other drug stores for $10.

## 2015-01-11 NOTE — Progress Notes (Signed)
   Subjective:    Patient ID: [name], female    DOB: [birth year], 40 y.o.   MRN: [id number]  HPI 40 yo female was at [facility] for some testing and sent over due to markedly elevated BP.  She has been out of her meds for over one month.  During that time, she has had mild, intermitant headaches.  No severe or sustained headache.  No focal neuro complaints.  Denies chest pain and shortness of breath.  No swelling.  Urine output appears normal.  Has only been taking spironolatctone and is about to run out of it.      Review of Systems     Objective:   Physical Exam No papiledema.  Normal mentation.  Markedly elevated BP confirmed. Lungs clear Cardiac RRR without m or g Ext no edema.        Assessment & Plan:  Greater than 50% of visit was counseling.  Visit duration was 30 minutes.

## 2015-01-11 NOTE — Assessment & Plan Note (Signed)
Hypertensive urgency without crisis.  She states that she can afford $4 meds at [facility].  Three of her four antihypertensives are on the $4 list.  The fourth, amlodipine, is available for $10 at other pharmacies.   She is [facility] to [facility] directly to [facility] and take her medications today.   Warned of red flag symptoms, Chest pain, severe headache and SOB. Recheck tomorrow to make sure BP is coming down.

## 2015-01-11 NOTE — Addendum Note (Signed)
Encounter addended by: Loletta Parish, RN on: 01/11/2015 10:50 AM<BR>     Documentation filed: Charges VN

## 2015-01-12 ENCOUNTER — Encounter: Payer: Self-pay | Admitting: Family Medicine

## 2015-01-12 ENCOUNTER — Ambulatory Visit (INDEPENDENT_AMBULATORY_CARE_PROVIDER_SITE_OTHER): Payer: Self-pay | Admitting: Family Medicine

## 2015-01-12 VITALS — BP 190/118 | HR 72 | Temp 98.0°F | Ht 69.0 in | Wt 165.8 lb

## 2015-01-12 DIAGNOSIS — N289 Disorder of kidney and ureter, unspecified: Secondary | ICD-10-CM

## 2015-01-12 DIAGNOSIS — I10 Essential (primary) hypertension: Secondary | ICD-10-CM

## 2015-01-12 DIAGNOSIS — N179 Acute kidney failure, unspecified: Secondary | ICD-10-CM

## 2015-01-12 LAB — BASIC METABOLIC PANEL WITH GFR
BUN: 13 mg/dL (ref 7–25)
CALCIUM: 9.7 mg/dL (ref 8.6–10.2)
CO2: 28 mmol/L (ref 20–31)
CREATININE: 1.17 mg/dL — AB (ref 0.50–1.10)
Chloride: 105 mmol/L (ref 98–110)
GFR, EST AFRICAN AMERICAN: 67 mL/min (ref >=60)
GFR, EST NON AFRICAN AMERICAN: 58 mL/min — AB (ref >=60)
Glucose, Bld: 77 mg/dL (ref 65–99)
Potassium: 4.1 mmol/L (ref 3.5–5.3)
SODIUM: 140 mmol/L (ref 135–146)

## 2015-01-12 MED ORDER — AMLODIPINE BESYLATE 10 MG PO TABS: 10.0000 mg | ORAL_TABLET | Freq: Every day | ORAL | Status: DC

## 2015-01-12 NOTE — Patient Instructions (Signed)
Thank you so much for coming to visit me today! We will check your kidney function today. We will contact you with the results. I have printed out a prescription for Amlodipine and a coupon, which should cost $8.06. If you have problems picking up this medication, please call the office and another medication can be sent in. Over the weekend and Monday, please check your blood pressure after resting for 10 minutes and call if your blood pressure is above 180/100. If you develop chest pain, headache, or shortness of breath, please call 911. Please follow up next week so we can recheck your blood pressures. Write down your blood pressures at home and bring them to that visit.  Thanks again! Dr. Gerlean Ren

## 2015-01-14 DIAGNOSIS — N179 Acute kidney failure, unspecified: Secondary | ICD-10-CM

## 2015-01-14 HISTORY — DX: Acute kidney failure, unspecified: N17.9

## 2015-01-14 NOTE — Progress Notes (Signed)
Subjective:     Patient ID: Katelyn [name], female   DOB: Jun 03, 1975, 40 y.o.   MRN: [id number]  HPI Katelyn [name] is a 40yo female presenting for follow up of hypertension. - Office visit on 15/17 with hypertension of 226/130 noted. No symptoms. Out of all of her medications for more than one month, so refills of Amlodipine, Coreg, Lisinopril-HCTZ, and Spiranolactone given. - States she was able to pick up all medications except for Amlodipine, which was $26 and too expensive for her to afford. - Has blood pressure cuff at home. - Denies headache, shortness of breath, chest pain - Last CMP in 11/24/2014 with new kidney injury. Creatinine 1.14. - PMH reviewed.  Review of Systems Per HPI. Other systems negative.    Objective:   Physical Exam  Constitutional: She appears well-developed and well-nourished. No distress.  HENT:  Head: Normocephalic and atraumatic.  Cardiovascular: Normal rate and regular rhythm.  Exam reveals no gallop and no friction rub.   No murmur heard. Pulmonary/Chest: Effort normal. No respiratory distress. She has no wheezes. She has no rales.  Musculoskeletal: She exhibits no edema.  Psychiatric: She has a normal mood and affect. Her behavior is normal.      Assessment and Plan:     Severe hypertension - Improved from 226/130 to 190/118 today - Coupon for Amlodipine given for $8 at [facility]. States she will be able to afford this. - To check blood pressure at home and record readings. To call office if readings consistently >180/100. - To present to ED if chest pain, shortness of breath, or headache occur. - Follow up next week to recheck blood pressure. Must lower carefully due to suspected chronic elevations.  Acute kidney injury ([place]) - Creatinine elevated to 1.14 in 11/2014, was consistently normal prior - Will recheck BMP today

## 2015-01-14 NOTE — Assessment & Plan Note (Signed)
-   Creatinine elevated to 1.14 in 11/2014, was consistently normal prior - Will recheck BMP today

## 2015-01-14 NOTE — Assessment & Plan Note (Signed)
-   Improved from 226/130 to 190/118 today - Coupon for Amlodipine given for $8 at [facility]. States she will be able to afford this. - To check blood pressure at home and record readings. To call office if readings consistently >180/100. - To present to ED if chest pain, shortness of breath, or headache occur. - Follow up next week to recheck blood pressure. Must lower carefully due to suspected chronic elevations.

## 2015-01-15 LAB — CYTOLOGY - PAP

## 2015-01-19 ENCOUNTER — Ambulatory Visit: Payer: No Typology Code available for payment source | Admitting: Family Medicine

## 2015-01-23 ENCOUNTER — Other Ambulatory Visit (HOSPITAL_COMMUNITY): Payer: Self-pay | Admitting: *Deleted

## 2015-01-23 ENCOUNTER — Telehealth (HOSPITAL_COMMUNITY): Payer: Self-pay | Admitting: *Deleted

## 2015-01-23 DIAGNOSIS — N760 Acute vaginitis: Principal | ICD-10-CM

## 2015-01-23 DIAGNOSIS — B9689 Other specified bacterial agents as the cause of diseases classified elsewhere: Secondary | ICD-10-CM

## 2015-01-23 MED ORDER — METRONIDAZOLE 500 MG PO TABS: 500.0000 mg | ORAL_TABLET | Freq: Two times a day (BID) | ORAL | Status: DC

## 2015-01-23 NOTE — Telephone Encounter (Signed)
Telephoned patient at home and left message to return call to [facility]. Need to give results to wet prep and pap smear. While charting patient returned call. Advised patient that pap smear did show some abnormal cells but HPV was negative but next pap smear due in one year. Wet prep showed bacterial vaginosis and medication was called into [facility] at [facility]. Advised patient to avoid alcohol while taking medication and to finish all medication. Patient voiced understanding.

## 2015-01-26 ENCOUNTER — Encounter: Payer: Self-pay | Admitting: Family Medicine

## 2015-03-01 ENCOUNTER — Encounter (HOSPITAL_COMMUNITY): Payer: Self-pay | Admitting: *Deleted

## 2015-03-01 ENCOUNTER — Emergency Department (HOSPITAL_COMMUNITY)
Admission: EM | Admit: 2015-03-01 | Discharge: 2015-03-01 | Disposition: A | Discharge status: Home / Self Care | Payer: MEDICAID | Attending: Emergency Medicine | Admitting: Emergency Medicine

## 2015-03-01 ENCOUNTER — Emergency Department (HOSPITAL_COMMUNITY): Payer: MEDICAID

## 2015-03-01 DIAGNOSIS — Z792 Long term (current) use of antibiotics: Secondary | ICD-10-CM | POA: Insufficient documentation

## 2015-03-01 DIAGNOSIS — I10 Essential (primary) hypertension: Secondary | ICD-10-CM | POA: Insufficient documentation

## 2015-03-01 DIAGNOSIS — Z8659 Personal history of other mental and behavioral disorders: Secondary | ICD-10-CM | POA: Insufficient documentation

## 2015-03-01 DIAGNOSIS — G43909 Migraine, unspecified, not intractable, without status migrainosus: Secondary | ICD-10-CM | POA: Insufficient documentation

## 2015-03-01 DIAGNOSIS — Z7982 Long term (current) use of aspirin: Secondary | ICD-10-CM | POA: Insufficient documentation

## 2015-03-01 DIAGNOSIS — Z862 Personal history of diseases of the blood and blood-forming organs and certain disorders involving the immune mechanism: Secondary | ICD-10-CM | POA: Insufficient documentation

## 2015-03-01 DIAGNOSIS — J069 Acute upper respiratory infection, unspecified: Secondary | ICD-10-CM | POA: Insufficient documentation

## 2015-03-01 DIAGNOSIS — Z79899 Other long term (current) drug therapy: Secondary | ICD-10-CM | POA: Insufficient documentation

## 2015-03-01 DIAGNOSIS — F17210 Nicotine dependence, cigarettes, uncomplicated: Secondary | ICD-10-CM | POA: Insufficient documentation

## 2015-03-01 DIAGNOSIS — B9789 Other viral agents as the cause of diseases classified elsewhere: Secondary | ICD-10-CM

## 2015-03-01 MED ORDER — BENZONATATE 100 MG PO CAPS: 100.0000 mg | ORAL_CAPSULE | Freq: Three times a day (TID) | ORAL | Status: DC

## 2015-03-01 NOTE — ED Notes (Signed)
Cold cough for 2 days with some chest congestion and pain.  Non-productived cough  No temp  lmp none

## 2015-03-01 NOTE — Discharge Instructions (Signed)
Upper Respiratory Infection, Adult Most upper respiratory infections (URIs) are a viral infection of the air passages leading to the lungs. A URI affects the nose, throat, and upper air passages. The most common type of URI is nasopharyngitis and is typically referred to as "the common cold." URIs run their course and usually go away on their own. Most of the time, a URI does not require medical attention, but sometimes a bacterial infection in the upper airways can follow a viral infection. This is called a secondary infection. Sinus and middle ear infections are common types of secondary upper respiratory infections. Bacterial pneumonia can also complicate a URI. A URI can worsen asthma and chronic obstructive pulmonary disease (COPD). Sometimes, these complications can require emergency medical care and may be life threatening.  CAUSES Almost all URIs are caused by viruses. A virus is a type of germ and can spread from one person to another.  RISKS FACTORS You may be at risk for a URI if:   You smoke.   You have chronic heart or lung disease.  You have a weakened defense (immune) system.   You are very young or very old.   You have nasal allergies or asthma.  You work in crowded or poorly ventilated areas.  You work in health care facilities or schools. SIGNS AND SYMPTOMS  Symptoms typically develop 2-3 days after you come in contact with a cold virus. Most viral URIs last 7-10 days. However, viral URIs from the influenza virus (flu virus) can last 14-18 days and are typically more severe. Symptoms may include:   Runny or stuffy (congested) nose.   Sneezing.   Cough.   Sore throat.   Headache.   Fatigue.   Fever.   Loss of appetite.   Pain in your forehead, behind your eyes, and over your cheekbones (sinus pain).  Muscle aches.  DIAGNOSIS  Your health care provider may diagnose a URI by:  Physical exam.  Tests to check that your symptoms are not due to  another condition such as:  Strep throat.  Sinusitis.  Pneumonia.  Asthma. TREATMENT  A URI goes away on its own with time. It cannot be cured with medicines, but medicines may be prescribed or recommended to relieve symptoms. Medicines may help:  Reduce your fever.  Reduce your cough.  Relieve nasal congestion. HOME CARE INSTRUCTIONS   Take medicines only as directed by your health care provider.   Gargle warm saltwater or take cough drops to comfort your throat as directed by your health care provider.  Use a warm mist humidifier or inhale steam from a shower to increase air moisture. This may make it easier to breathe.  Drink enough fluid to keep your urine clear or pale yellow.   Eat soups and other clear broths and maintain good nutrition.   Rest as needed.   Return to work when your temperature has returned to normal or as your health care provider advises. You may need to stay home longer to avoid infecting others. You can also use a face mask and careful hand washing to prevent spread of the virus.  Increase the usage of your inhaler if you have asthma.   Do not use any tobacco products, including cigarettes, chewing tobacco, or electronic cigarettes. If you need help quitting, ask your health care provider. PREVENTION  The best way to protect yourself from getting a cold is to practice good hygiene.   Avoid oral or hand contact with people with cold   symptoms.   Wash your hands often if contact occurs.  There is no clear evidence that vitamin C, vitamin E, echinacea, or exercise reduces the chance of developing a cold. However, it is always recommended to get plenty of rest, exercise, and practice good nutrition.  SEEK MEDICAL CARE IF:   You are getting worse rather than better.   Your symptoms are not controlled by medicine.   You have chills.  You have worsening shortness of breath.  You have brown or red mucus.  You have yellow or brown nasal  discharge.  You have pain in your face, especially when you bend forward.  You have a fever.  You have swollen neck glands.  You have pain while swallowing.  You have white areas in the back of your throat. SEEK IMMEDIATE MEDICAL CARE IF:   You have severe or persistent:  Headache.  Ear pain.  Sinus pain.  Chest pain.  You have chronic lung disease and any of the following:  Wheezing.  Prolonged cough.  Coughing up blood.  A change in your usual mucus.  You have a stiff neck.  You have changes in your:  Vision.  Hearing.  Thinking.  Mood. MAKE SURE YOU:   Understand these instructions.  Will watch your condition.  Will get help right away if you are not doing well or get worse.   This information is not intended to replace advice given to you by your health care provider. Make sure you discuss any questions you have with your health care provider.   Document Released: 06/18/2000 Document Revised: 05/09/2014 Document Reviewed: 03/30/2013 Elsevier Interactive Patient Education 2016 Elsevier Inc.  

## 2015-03-01 NOTE — ED Notes (Signed)
Patient able to ambulate independently  

## 2015-03-01 NOTE — ED Provider Notes (Signed)
CSN: [id number]     Arrival date & time 03/01/15  2119 History  By signing my name below, I, Katelyn [name], attest that this documentation has been prepared under the direction and in the presence of Katelyn Quill, NP. Electronically Signed: Meriel [name], ED Scribe. 03/01/2015. 10:59 PM.   Chief Complaint  Patient presents with  . Cough   Patient is a 40 y.o. female presenting with cough. The history is provided by the patient. No language interpreter was used.  Cough Cough characteristics:  Non-productive Severity:  Moderate Duration:  2 days Timing:  Constant Progression:  Unchanged Chronicity:  New Smoker: no   Context: weather changes   Relieved by:  None tried Worsened by:  Deep breathing Ineffective treatments:  None tried Associated symptoms: chest pain   Associated symptoms: no fever, no rhinorrhea and no sore throat    HPI Comments: Katelyn [name] is a 40 y.o. female who presents to the Emergency Department complaining of a non-productive cough that has been persistent for 2 days. She associates chest congestion and a sharp chest pain onset this evening that is exacerbated with coughing and inspiration. Pt denies smoking or use of birth control. No rhinorrhea or other associated symptoms.   PCP: Dr. Lonny Prude    Past Medical History  Diagnosis Date  . Hypertension   . Palpitations   . Chest pain   . Anemia   . Anxiety   . Depression   . Migraine    Past Surgical History  Procedure Laterality Date  . Hysteroscopy w/ endometrial ablation  2010  . Tubal ligation  2006  . Laparoscopy N/A 11/25/2014    Procedure: LAPAROSCOPY OPERATIVE WITH LEFT OVARIAN CYSTECTOMY;  Surgeon: Osborne Oman, MD;  Location: [facility];  Service: Gynecology;  Laterality: N/A;   Family History  Problem Relation Age of Onset  . Diabetes Mother   . Depression Mother   . Hypertension Mother   . Hypertension Father   . Hypertension Brother   . Hypertension Brother    Social History   Substance Use Topics  . Smoking status: Current Every Day Smoker -- 0.00 packs/day    Types: Cigarettes    Start date: 01/06/1994  . Smokeless tobacco: Never Used     Comment: smoking 2 cigs daily; trying to quit  . 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  HENT: Positive for congestion ( chest). Negative for rhinorrhea and sore throat.   Respiratory: Positive for cough.   Cardiovascular: Positive for chest pain.  All other systems reviewed and are negative.  Allergies  Metoprolol  Home Medications   Prior to Admission medications   Medication Sig Start Date End Date Taking? Authorizing Provider  amLODipine (NORVASC) 10 MG tablet Take 1 tablet (10 mg total) by mouth daily. 01/12/15   [place], DO  aspirin-acetaminophen-caffeine (EXCEDRIN MIGRAINE) [phone] MG per tablet Take 1 tablet by mouth every 6 (six) hours as needed for headache. Reported on 01/11/2015    Historical Provider, MD  atorvastatin (LIPITOR) 40 MG tablet Take 1 tablet (40 mg total) by mouth daily. Patient not taking: Reported on 01/11/2015 02/05/14   Lorie Apley  Dianah Field, MD  benzonatate (TESSALON) 100 MG capsule Take 1 capsule (100 mg total) by mouth every 8 (eight) hours. 03/01/15   Katelyn Quill, NP  carvedilol (COREG) 6.25 MG tablet Take 1 tablet (6.25 mg total) by mouth 2 (two) times daily with a meal. 01/11/15   Katelyn Resides, MD  docusate sodium (COLACE) 100 MG capsule Take 1 capsule (100 mg total) by mouth 2 (two) times daily as needed. Patient not taking: Reported on 12/15/2014 11/25/14   Osborne Oman, MD  ibuprofen (ADVIL,MOTRIN) 600 MG tablet Take 1 tablet (600 mg total) by mouth every 6 (six) hours as needed. Patient not taking: Reported on 01/11/2015 11/25/14   Osborne Oman, MD  lisinopril-hydrochlorothiazide (ZESTORETIC) 20-12.5 MG tablet Take 1 tablet by mouth daily. 01/11/15   Katelyn Resides, MD  metroNIDAZOLE (FLAGYL) 500 MG tablet Take 1 tablet (500 mg total) by mouth 2 (two) times daily. 01/23/15   Peggy Constant, MD  oxyCODONE-acetaminophen (PERCOCET/ROXICET) 5-325 MG tablet Take 1-2 tablets by mouth every 6 (six) hours as needed. Patient not taking: Reported on 12/15/2014 11/25/14   Osborne Oman, MD  sertraline (ZOLOFT) 100 MG tablet TAKE ONE TABLET BY MOUTH ONCE DAILY Patient not taking: Reported on 01/11/2015 11/02/13   Mariel Aloe, MD  spironolactone (ALDACTONE) 25 MG tablet Take 1 tablet (25 mg total) by mouth daily. 01/11/15   Katelyn Resides, MD   BP 167/117 mmHg  Pulse 71  Temp(Src) 98.4 F (36.9 C)  Resp 16  Ht 5\' 9"  (1.753 m)  SpO2 100% Physical Exam  Constitutional: She is oriented to person, place, and time. She appears well-developed and well-nourished. No distress.  HENT:  Head: Normocephalic.  Eyes: Conjunctivae are normal.  Neck: Normal range of motion. Neck supple.  Cardiovascular: Normal rate, regular rhythm and normal heart sounds.   Pulmonary/Chest: Effort normal and breath sounds normal. No respiratory distress. She has no wheezes. She has no rales.  Musculoskeletal: Normal range of motion.  Neurological: She is alert and oriented to person, place, and time. Coordination normal.  Skin: Skin is warm.  Psychiatric: She has a normal mood and affect. Her behavior is normal.  Nursing note and vitals reviewed.   ED Course  Procedures  DIAGNOSTIC STUDIES: Oxygen Saturation is 100% on RA, normal by my interpretation.    COORDINATION OF CARE: 10:56 PM Discussed treatment plan with pt at bedside and pt agreed to plan.  Imaging Review Dg Chest 2 View  03/01/2015  CLINICAL DATA:  Cough and congestion for 2 days. EXAM: CHEST  2 VIEW COMPARISON:  07/31/2014 FINDINGS: The cardiomediastinal contours are normal. The lungs  are clear. Pulmonary vasculature is normal. No consolidation, pleural effusion, or pneumothorax. No acute osseous abnormalities are seen. IMPRESSION: No acute pulmonary process. Electronically Signed   By: Jeb Levering M.D.   On: 03/01/2015 22:05   I have personally reviewed and evaluated these images results as part of my medical decision-making.  Radiology results shared with patient. MDM   Final diagnoses:  Viral URI with cough    Pt symptoms consistent with URI. CXR negative for acute infiltrate. Pt will be discharged with symptomatic treatment, including tessalon for cough.  Discussed return precautions.  Pt is hemodynamically stable & in NAD prior to discharge. She has follow up with her PCP.    I personally performed the services described in this documentation, which was scribed in my presence. The recorded information has been reviewed and is accurate.  Katelyn Quill, NP 03/02/15 [id number]  Malvin Johns, MD 03/03/15 [id number]

## 2015-03-29 ENCOUNTER — Other Ambulatory Visit: Payer: Self-pay | Admitting: Family Medicine

## 2015-05-14 ENCOUNTER — Other Ambulatory Visit: Payer: Self-pay | Admitting: Obstetrics and Gynecology

## 2015-05-14 DIAGNOSIS — N6310 Unspecified lump in the right breast, unspecified quadrant: Secondary | ICD-10-CM

## 2015-05-30 ENCOUNTER — Ambulatory Visit
Admission: RE | Admit: 2015-05-30 | Discharge: 2015-05-30 | Disposition: A | Discharge status: Home / Self Care | Payer: No Typology Code available for payment source | Source: Ambulatory Visit | Attending: Obstetrics and Gynecology | Admitting: Obstetrics and Gynecology

## 2015-05-30 DIAGNOSIS — N6310 Unspecified lump in the right breast, unspecified quadrant: Secondary | ICD-10-CM

## 2015-06-15 ENCOUNTER — Ambulatory Visit (INDEPENDENT_AMBULATORY_CARE_PROVIDER_SITE_OTHER): Payer: Self-pay | Admitting: Family Medicine

## 2015-06-15 ENCOUNTER — Encounter: Payer: Self-pay | Admitting: Family Medicine

## 2015-06-15 VITALS — BP 182/118 | HR 79 | Temp 98.1°F | Ht 69.0 in | Wt 169.6 lb

## 2015-06-15 DIAGNOSIS — G43009 Migraine without aura, not intractable, without status migrainosus: Secondary | ICD-10-CM

## 2015-06-15 MED ORDER — IBUPROFEN 600 MG PO TABS: 600.0000 mg | ORAL_TABLET | Freq: Three times a day (TID) | ORAL | Status: DC | PRN

## 2015-06-15 NOTE — Progress Notes (Signed)
    Subjective   Katelyn [name] is a 40 y.o. female that presents for a same day visit  1. Headaches: Symptoms started about one month ago. She describes headaches as being located frontal and left temporal, sharp pain. She is having episodes daily. Some episodes are significant for photophobia and phonophobia. No associated nausea or vomiting. She has been taking Excederin migraine or BC powder which sometimes help with her pain. Since starting one month ago, symptoms have been waxing and waning. No increased stress.   ROS Per HPI  Social History  Substance Use Topics  . Smoking status: Current Every Day Smoker -- 0.00 packs/day    Types: Cigarettes    Start date: 01/06/1994  . Smokeless tobacco: Never Used     Comment: smoking 2 cigs daily; trying to quit  . 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 Metoprolol Nausea Only    Makes her feel very sick when taking med    Objective   BP 182/118 mmHg  Pulse 79  Temp(Src) 98.1 F (36.7 C) (Oral)  Ht 5\' 9"  (1.753 m)  Wt 169 lb 9.6 oz (76.93 kg)  BMI 25.03 kg/m2  General: Well appearing HEENT:   Head:  Normocephalic, atraumatic, no frontal/maxillary sinus tenderness  Eyes: Pupils equal and reactive to light/accomodation. Extraocular movements intact bilaterally.  Ears: Tympanic membranes normal bilaterally.  Nose/Throat: Nares patent bilaterally. Oropharnx clear and moist.  Neck: No cervical adenopathy bilaterally Neuro: CN grossly intact  Assessment and Plan   1. Migraine without aura and without status migrainosus, not intractable Will prescribe ibuprofen for short term management. Patient's blood pressure likely playing a part in symptoms. No chest pain or dyspnea. Patient states blood pressure usually better controlled. Had a long discussion with the patient with regard to risks of prolonged significant  hypertension. Will have patient follow-up for reevaluation next week.

## 2015-06-15 NOTE — Patient Instructions (Signed)
Thank you for coming to see me today. It was a pleasure. Today we talked about:   Migraine: I will give you a short course of ibuprofen for this headache. Please uses very sparingly as it can negatively affect her kidneys in addition to your heart. I would like you to follow-up next week so we can start to more tightly manage her blood pressure as this will have very detrimental effects on your kidneys, heart, brain.  If you have any questions or concerns, please do not hesitate to call the office at [phone].  Sincerely,  Cordelia Poche, MD

## 2015-07-16 ENCOUNTER — Encounter (HOSPITAL_COMMUNITY): Payer: Self-pay | Admitting: *Deleted

## 2015-10-31 ENCOUNTER — Encounter (HOSPITAL_COMMUNITY): Payer: Self-pay

## 2015-10-31 ENCOUNTER — Ambulatory Visit (INDEPENDENT_AMBULATORY_CARE_PROVIDER_SITE_OTHER): Payer: PRIVATE HEALTH INSURANCE | Admitting: Family Medicine

## 2015-10-31 ENCOUNTER — Encounter: Payer: Self-pay | Admitting: Family Medicine

## 2015-10-31 ENCOUNTER — Emergency Department (HOSPITAL_COMMUNITY)
Admission: EM | Admit: 2015-10-31 | Discharge: 2015-11-01 | Disposition: A | Discharge status: Home / Self Care | Payer: PRIVATE HEALTH INSURANCE | Attending: Emergency Medicine | Admitting: Emergency Medicine

## 2015-10-31 VITALS — BP 210/140 | HR 82 | Temp 98.4°F | Ht 69.0 in | Wt 175.0 lb

## 2015-10-31 DIAGNOSIS — Z23 Encounter for immunization: Secondary | ICD-10-CM

## 2015-10-31 DIAGNOSIS — N760 Acute vaginitis: Secondary | ICD-10-CM | POA: Diagnosis not present

## 2015-10-31 DIAGNOSIS — G43909 Migraine, unspecified, not intractable, without status migrainosus: Secondary | ICD-10-CM | POA: Insufficient documentation

## 2015-10-31 DIAGNOSIS — I10 Essential (primary) hypertension: Secondary | ICD-10-CM | POA: Diagnosis not present

## 2015-10-31 DIAGNOSIS — F17210 Nicotine dependence, cigarettes, uncomplicated: Secondary | ICD-10-CM | POA: Insufficient documentation

## 2015-10-31 DIAGNOSIS — G43009 Migraine without aura, not intractable, without status migrainosus: Secondary | ICD-10-CM

## 2015-10-31 DIAGNOSIS — Z79899 Other long term (current) drug therapy: Secondary | ICD-10-CM | POA: Insufficient documentation

## 2015-10-31 DIAGNOSIS — B9689 Other specified bacterial agents as the cause of diseases classified elsewhere: Secondary | ICD-10-CM

## 2015-10-31 LAB — CBC
HCT: 41.6 % (ref 36.0–46.0)
HEMOGLOBIN: 14.9 g/dL (ref 12.0–15.0)
MCH: 31.1 pg (ref 26.0–34.0)
MCHC: 35.8 g/dL (ref 30.0–36.0)
MCV: 86.8 fL (ref 78.0–100.0)
Platelets: 198 10*3/uL (ref 150–400)
RBC: 4.79 MIL/uL (ref 3.87–5.11)
RDW: 12.1 % (ref 11.5–15.5)
WBC: 11.5 10*3/uL — ABNORMAL HIGH (ref 4.0–10.5)

## 2015-10-31 LAB — BASIC METABOLIC PANEL
ANION GAP: 8 (ref 5–15)
BUN: 13 mg/dL (ref 6–20)
CALCIUM: 9.5 mg/dL (ref 8.9–10.3)
CO2: 28 mmol/L (ref 22–32)
Chloride: 102 mmol/L (ref 101–111)
Creatinine, Ser: 1.17 mg/dL — ABNORMAL HIGH (ref 0.44–1.00)
GFR, EST NON AFRICAN AMERICAN: 57 mL/min — AB (ref >60)
GLUCOSE: 129 mg/dL — AB (ref 65–99)
POTASSIUM: 3.6 mmol/L (ref 3.5–5.1)
Sodium: 138 mmol/L (ref 135–145)

## 2015-10-31 MED ORDER — CARVEDILOL 6.25 MG PO TABS: 6.2500 mg | ORAL_TABLET | Freq: Two times a day (BID) | ORAL | 0 refills | Status: DC

## 2015-10-31 MED ORDER — SPIRONOLACTONE 25 MG PO TABS: 25.0000 mg | ORAL_TABLET | Freq: Every day | ORAL | Status: DC

## 2015-10-31 MED ORDER — KETOROLAC TROMETHAMINE 30 MG/ML IJ SOLN
30.0000 mg | Freq: Once | INTRAMUSCULAR | Status: AC
  Administered 2015-10-31: 30 mg via INTRAVENOUS
  Filled 2015-10-31: qty 1

## 2015-10-31 MED ORDER — PROCHLORPERAZINE EDISYLATE 5 MG/ML IJ SOLN
10.0000 mg | Freq: Once | INTRAMUSCULAR | Status: AC
  Administered 2015-10-31: 10 mg via INTRAVENOUS
  Filled 2015-10-31: qty 2

## 2015-10-31 MED ORDER — AMLODIPINE BESYLATE 5 MG PO TABS
10.0000 mg | ORAL_TABLET | Freq: Once | ORAL | Status: AC
  Administered 2015-10-31: 10 mg via ORAL
  Filled 2015-10-31: qty 2

## 2015-10-31 MED ORDER — SPIRONOLACTONE 25 MG PO TABS: 25.0000 mg | ORAL_TABLET | Freq: Every day | ORAL | 0 refills | Status: DC

## 2015-10-31 MED ORDER — LISINOPRIL-HYDROCHLOROTHIAZIDE 20-12.5 MG PO TABS: 1.0000 | ORAL_TABLET | Freq: Every day | ORAL | 0 refills | Status: DC

## 2015-10-31 MED ORDER — AMLODIPINE BESYLATE 10 MG PO TABS: 10.0000 mg | ORAL_TABLET | Freq: Every day | ORAL | 0 refills | Status: DC

## 2015-10-31 NOTE — Progress Notes (Signed)
Subjective: CC: migraine HPI: Patient is a 40 y.o. female with a past medical history of migraine without aura and HTN presenting to clinic today for a SDA for migraine.  Migraine started on Tuesday AM (pt works 3rd shift).  It comes and goes.  Right temporal/frontal region. Feels like dull ache currently, earlier was sharp pain. She had to leave work previously due to migraine. Endorses photophobia. Movement makes it worse.  No worsening with sound No change in vision. The other day she had blurred vision. No N/V.  No aura, halos of light.  She notes this is typical of her migraines. BC powder helped eased it up.   No chest pain or SOB currently but notes she had a "ping" of chest pain and SOB earlier when she was in the waiting room. No numbness or tingling.  Of note, her BP is elevated. She notes her BP is typically this high when she has migraines. She's been out of amlodipine and coreg for 1 month. Still taking lisinopril-HCTZ.   Social History: current smoker  ROS: All other systems reviewed and are negative. She notes lump on R anterior chest. It initially was sore. Doesn't recall injury. No change in size. Never hand anything like this before.   Past Medical History Patient Active Problem List   Diagnosis Date Noted  . Acute kidney injury ([place]) 01/14/2015  . Breast lump in upper outer quadrant 12/28/2014  . Ovarian torsion 11/25/2014  . Snoring 06/23/2014  . Abdominal pain, RLQ 06/23/2014  . Atypical chest pain 01/30/2014  . Migraine headache without aura 07/29/2012  . Elevated LFTs 04/26/2012  . GAD (generalized anxiety disorder) 03/20/2011  . Panic disorder with agoraphobia and moderate panic attacks 02/26/2009  . CIGARETTE SMOKER 11/24/2008  . Severe hypertension 03/05/2006    Medications- reviewed and updated  Objective: Office vital signs reviewed. BP (!) 210/140   Pulse 82   Temp 98.4 F (36.9 C) (Oral)   Ht 5\' 9"  (1.753 m)   Wt 175 lb (79.4  kg)   BMI 25.84 kg/m    Physical Examination:  General: Awake, alert, well- nourished, NAD ENMT:  TMs intact, normal light reflex, no erythema, no bulging. Nasal turbinates moist. MMM, Oropharynx clear without erythema or tonsillar exudate/hypertrophy Eyes: Conjunctiva non-injected. PERRL. Unremarkable fundoscopic exam  Cardio: RRR, no m/r/g noted.  Pulm: No increased WOB.  CTAB, without wheezes, rhonchi or crackles noted.  Extremities: no pitting edema  MSK: Normal gait and station Skin: 1-2cm circular nodule over the R anterior chest wall, firm, non-mobile, slightly tender.  Neuro: A&O x4. Speech clear. EOMI, Uvula and tongue midline. Facial movements symmetric. 5/5 strength in the upper extremities and lower extremities bilaterally. Sensation intact bilaterally. Normal DTRs.    Assessment/Plan: Migraine headache without aura Difficult to ascertain whether her significantly elevated BP is the etiology or if her BP is elevated secondary to her headache. No red flags on exam, neurologically intact. - will go to ED for hypertensive urgency vs emergency.  Severe hypertension Patients BPs are always elevated, but they haven't been this elevated since January. Discussed clonidine in clinic with labwork and getting her BP medications on her way home with close follow up tomorrow vs ED tonight. Pt would prefer ED.  - will send to ED to evaluate for end organ damage and attempt to get her BPs under control. - I have refilled her medications, I discussed the importance of taking her medications regularly. She notes she can now afford them.  Orders Placed This Encounter  Procedures  . Flu Vaccine QUAD 36+ mos IM    Meds ordered this encounter  Medications  . amLODipine (NORVASC) 10 MG tablet    Sig: Take 1 tablet (10 mg total) by mouth daily.    Dispense:  30 tablet    Refill:  0  . carvedilol (COREG) 6.25 MG tablet    Sig: Take 1 tablet (6.25 mg total) by mouth 2 (two) times daily  with a meal.    Dispense:  60 tablet    Refill:  0  . lisinopril-hydrochlorothiazide (ZESTORETIC) 20-12.5 MG tablet    Sig: Take 1 tablet by mouth daily.    Dispense:  30 tablet    Refill:  0  . spironolactone (ALDACTONE) 25 MG tablet    Sig: Take 1 tablet (25 mg total) by mouth daily.    Dispense:  30 tablet    Refill:  [place] PGY-3, [place]

## 2015-10-31 NOTE — Assessment & Plan Note (Signed)
Difficult to ascertain whether her significantly elevated BP is the etiology or if her BP is elevated secondary to her headache. No red flags on exam, neurologically intact. - will go to ED for hypertensive urgency vs emergency.

## 2015-10-31 NOTE — Assessment & Plan Note (Signed)
Patients BPs are always elevated, but they haven't been this elevated since January. Discussed clonidine in clinic with labwork and getting her BP medications on her way home with close follow up tomorrow vs ED tonight. Pt would prefer ED.  - will send to ED to evaluate for end organ damage and attempt to get her BPs under control. - I have refilled her medications, I discussed the importance of taking her medications regularly. She notes she can now afford them.

## 2015-10-31 NOTE — ED Provider Notes (Signed)
[facility] DEPT Provider Note   CSN: [id number] Arrival date & time: 10/31/15  1725     History   Chief Complaint Chief Complaint  Patient presents with  . Migraine  . Hypertension    HPI Katelyn [name] is a 40 y.o. female.  HPI Pt has a history htn.   She ran out of two of her medications last month.  She is still taking the lisinopril.   2 days ago she started with a headache.  The headache is mostly on the right side.  It goes toward the middle.  No fever, no vomiting, no blurred vision, no trouble with speech or coordination.  No weakness.  She has migraines and this feels like a migraine headache   Past Medical History:  Diagnosis Date  . Anemia   . Anxiety   . Chest pain   . Depression   . Hypertension   . Migraine   . Palpitations     Patient Active Problem List   Diagnosis Date Noted  . Acute kidney injury ([place]) 01/14/2015  . Breast lump in upper outer quadrant 12/28/2014  . Ovarian torsion 11/25/2014  . Snoring 06/23/2014  . Abdominal pain, RLQ 06/23/2014  . Atypical chest pain 01/30/2014  . Migraine headache without aura 07/29/2012  . Elevated LFTs 04/26/2012  . GAD (generalized anxiety disorder) 03/20/2011  . Panic disorder with agoraphobia and moderate panic attacks 02/26/2009  . CIGARETTE SMOKER 11/24/2008  . Severe hypertension 03/05/2006    Past Surgical History:  Procedure Laterality Date  . HYSTEROSCOPY W/ ENDOMETRIAL ABLATION  2010  . LAPAROSCOPY N/A 11/25/2014   Procedure: LAPAROSCOPY OPERATIVE WITH LEFT OVARIAN CYSTECTOMY;  Surgeon: Osborne Oman, MD;  Location: [facility];  Service: Gynecology;  Laterality: N/A;  . TUBAL LIGATION  2006    OB History    Gravida Para Term Preterm AB Living   6 3 3   3 3    SAB TAB Ectopic Multiple Live Births     3             Home Medications    Prior to Admission medications   Medication Sig Start Date End Date Taking? Authorizing Provider  amLODipine (NORVASC) 10 MG tablet Take 1  tablet (10 mg total) by mouth daily. 11/01/15   Dorie Rank, MD  atorvastatin (LIPITOR) 40 MG tablet Take 1 tablet (40 mg total) by mouth daily. Patient not taking: Reported on 10/31/2015 02/05/14   Hilton Sinclair, MD  carvedilol (COREG) 6.25 MG tablet Take 1 tablet (6.25 mg total) by mouth 2 (two) times daily with a meal. 10/31/15   Archie Patten, MD  docusate sodium (COLACE) 100 MG capsule Take 1 capsule (100 mg total) by mouth 2 (two) times daily as needed. Patient not taking: Reported on 10/31/2015 11/25/14   Osborne Oman, MD  ibuprofen (ADVIL,MOTRIN) 600 MG tablet Take 1 tablet (600 mg total) by mouth every 8 (eight) hours as needed. Patient not taking: Reported on 10/31/2015 06/15/15   Mariel Aloe, MD  lisinopril-hydrochlorothiazide (ZESTORETIC) 20-12.5 MG tablet Take 1 tablet by mouth daily. 10/31/15   Archie Patten, MD  oxyCODONE-acetaminophen (PERCOCET/ROXICET) 5-325 MG tablet Take 1-2 tablets by mouth every 6 (six) hours as needed. Patient not taking: Reported on 10/31/2015 11/25/14   Osborne Oman, MD  sertraline (ZOLOFT) 100 MG tablet TAKE ONE TABLET BY MOUTH ONCE DAILY Patient not taking: Reported on 10/31/2015 03/29/15   Mariel Aloe, MD  spironolactone (ALDACTONE) 25 MG  tablet Take 1 tablet (25 mg total) by mouth daily. 11/01/15   Dorie Rank, MD    Family History Family History  Problem Relation Age of Onset  . Diabetes Mother   . Depression Mother   . Hypertension Mother   . Hypertension Father   . Hypertension Brother   . Hypertension Brother     Social History Social History  Substance Use Topics  . Smoking status: Current Every Day Smoker    Packs/day: 0.50    Types: Cigarettes    Start date: 01/06/1994  . Smokeless tobacco: Never Used     Comment: smoking 2 cigs daily; trying to quit  . Alcohol use 0.0 oz/week     Comment: LAST DRANK-  THURS- WINE COOLER     Allergies   Metoprolol   Review of Systems Review of Systems  All other  systems reviewed and are negative.    Physical Exam Updated Vital Signs BP (!) 148/106   Pulse 61   Temp 98.3 F (36.8 C) (Oral)   Resp 18   SpO2 99%   Physical Exam  Constitutional: She appears well-developed and well-nourished. No distress.  HENT:  Head: Normocephalic and atraumatic.  Right Ear: External ear normal.  Left Ear: External ear normal.  Eyes: Conjunctivae are normal. Right eye exhibits no discharge. Left eye exhibits no discharge. No scleral icterus.  Neck: Neck supple. No tracheal deviation present.  Cardiovascular: Normal rate, regular rhythm and intact distal pulses.   Pulmonary/Chest: Effort normal and breath sounds normal. No stridor. No respiratory distress. She has no wheezes. She has no rales.  Abdominal: Soft. Bowel sounds are normal. She exhibits no distension. There is no tenderness. There is no rebound and no guarding.  Musculoskeletal: She exhibits no edema or tenderness.  Neurological: She is alert. She has normal strength. No cranial nerve deficit (no facial droop, extraocular movements intact, no slurred speech) or sensory deficit. She exhibits normal muscle tone. She displays no seizure activity. Coordination normal.  Skin: Skin is warm and dry. No rash noted.  Psychiatric: She has a normal mood and affect.  Nursing note and vitals reviewed.    ED Treatments / Results  Labs (all labs ordered are listed, but only abnormal results are displayed) Labs Reviewed  BASIC METABOLIC PANEL - Abnormal; Notable for the following:       Result Value   Glucose, Bld 129 (*)    Creatinine, Ser 1.17 (*)    GFR calc non Af Amer 57 (*)    All other components within normal limits  CBC - Abnormal; Notable for the following:    WBC 11.5 (*)    All other components within normal limits     Radiology No results found.  Procedures Procedures (including critical care time)  Medications Ordered in ED Medications  spironolactone (ALDACTONE) tablet 25 mg  (not administered)  prochlorperazine (COMPAZINE) injection 10 mg (10 mg Intravenous Given 10/31/15 2305)  ketorolac (TORADOL) 30 MG/ML injection 30 mg (30 mg Intravenous Given 10/31/15 2305)  amLODipine (NORVASC) tablet 10 mg (10 mg Oral Given 10/31/15 2305)     Initial Impression / Assessment and Plan / ED Course  I have reviewed the triage vital signs and the nursing notes.  Pertinent labs & imaging results that were available during my care of the patient were reviewed by me and considered in my medical decision making (see chart for details).  Clinical Course   Pt's symptoms improved in the ED.   Headache has  resolved now.  BP also improved with her oral medications.   Will refill the two that she ran out of.  Follow up with PCP    Final Clinical Impressions(s) / ED Diagnoses   Final diagnoses:  Migraine without aura and without status migrainosus, not intractable  Essential hypertension    New Prescriptions Current Discharge Medication List    START taking these medications   Details  amLODipine (NORVASC) 10 MG tablet Take 1 tablet (10 mg total) by mouth daily. Qty: 30 tablet, Refills: 1    carvedilol (COREG) 6.25 MG tablet Take 1 tablet (6.25 mg total) by mouth 2 (two) times daily with a meal. Qty: 60 tablet, Refills: 0   Associated Diagnoses: Severe hypertension    lisinopril-hydrochlorothiazide (ZESTORETIC) 20-12.5 MG tablet Take 1 tablet by mouth daily. Qty: 30 tablet, Refills: 0   Associated Diagnoses: Severe hypertension    spironolactone (ALDACTONE) 25 MG tablet Take 1 tablet (25 mg total) by mouth daily. Qty: 30 tablet, Refills: 1   Associated Diagnoses: Severe hypertension         Dorie Rank, MD 11/01/15 [id number]

## 2015-10-31 NOTE — ED Triage Notes (Signed)
Pt reports headache X2 days. She also reports HTN and that she has been out of HTN meds X1 month. No neuro deficits noted. A&o X4.

## 2015-11-01 MED ORDER — SPIRONOLACTONE 25 MG PO TABS: 25.0000 mg | ORAL_TABLET | Freq: Every day | ORAL | 1 refills | Status: DC

## 2015-11-01 MED ORDER — AMLODIPINE BESYLATE 10 MG PO TABS: 10.0000 mg | ORAL_TABLET | Freq: Every day | ORAL | 1 refills | Status: DC

## 2015-11-01 NOTE — ED Notes (Signed)
Patient left at this time with all belongings, refused wheelchair. 

## 2015-11-01 NOTE — Discharge Instructions (Signed)
Follow up with your doctor, continue your current blood pressure medications

## 2015-11-01 NOTE — ED Notes (Signed)
MD at bedside. 

## 2015-12-10 ENCOUNTER — Encounter: Payer: Self-pay | Admitting: Family Medicine

## 2015-12-10 ENCOUNTER — Ambulatory Visit (INDEPENDENT_AMBULATORY_CARE_PROVIDER_SITE_OTHER): Payer: PRIVATE HEALTH INSURANCE | Admitting: Family Medicine

## 2015-12-10 ENCOUNTER — Other Ambulatory Visit (HOSPITAL_COMMUNITY)
Admission: RE | Admit: 2015-12-10 | Discharge: 2015-12-10 | Disposition: A | Discharge status: Home / Self Care | Payer: PRIVATE HEALTH INSURANCE | Source: Ambulatory Visit | Attending: Family Medicine | Admitting: Family Medicine

## 2015-12-10 VITALS — BP 150/98 | HR 81 | Temp 98.4°F | Wt 176.0 lb

## 2015-12-10 DIAGNOSIS — Z113 Encounter for screening for infections with a predominantly sexual mode of transmission: Secondary | ICD-10-CM

## 2015-12-10 DIAGNOSIS — R109 Unspecified abdominal pain: Secondary | ICD-10-CM | POA: Diagnosis not present

## 2015-12-10 DIAGNOSIS — Z114 Encounter for screening for human immunodeficiency virus [HIV]: Secondary | ICD-10-CM

## 2015-12-10 DIAGNOSIS — N898 Other specified noninflammatory disorders of vagina: Secondary | ICD-10-CM

## 2015-12-10 DIAGNOSIS — R1031 Right lower quadrant pain: Secondary | ICD-10-CM

## 2015-12-10 LAB — POCT URINALYSIS DIPSTICK
Bilirubin, UA: NEGATIVE
Blood, UA: NEGATIVE
GLUCOSE UA: NEGATIVE
Ketones, UA: NEGATIVE
Leukocytes, UA: NEGATIVE
NITRITE UA: NEGATIVE
PROTEIN UA: NEGATIVE
Spec Grav, UA: 1.025
UROBILINOGEN UA: 0.2
pH, UA: 5.5

## 2015-12-10 LAB — POCT WET PREP (WET MOUNT): CLUE CELLS WET PREP WHIFF POC: NEGATIVE

## 2015-12-10 MED ORDER — METRONIDAZOLE 500 MG PO TABS
500.0000 mg | ORAL_TABLET | Freq: Once | ORAL | Status: AC
  Administered 2015-12-10: 500 mg via ORAL

## 2015-12-10 NOTE — Assessment & Plan Note (Signed)
Concern for recurrent cyst with intermittent torsion. Doubt appendicitis or abscess. No signs of obstruction. UA clear - doubt UTI or pyelonephritis. Will check ultrasound to rule out cyst. Strict return precautions reviewed.

## 2015-12-10 NOTE — Patient Instructions (Signed)
Please talk to your last partner about your diagnosis. You were treated today.  We will schedule you to have an ultrasound to make sure you dont have a cyst. If you starting having severe pain that is not improving, please go to [facility].  Take care,  Dr Jerline Pain

## 2015-12-10 NOTE — Progress Notes (Signed)
    Subjective:  Katelyn [name] is a 40 y.o. female who presents to the [facility] today with a chief complaint of vaginal discharge.    HPI:  Vaginal Discharge Symptoms started about a week ago. Discharge is thick and white and has a mildly foul smelling odor.   Patient also with intermittent RLQ abdominal pain over that time frame. Pain occurs randomly and will last for about 15-20 minutes before subsiding. Pain is so severe that it causes the patient to double over in pain. Normal BM. No dysuria. No fevers or chills. No nausea or vomiting. Has not tried any medications.   ROS: Per HPI  PMH: Smoking history reviewed.    Objective:  Physical Exam: BP (!) 150/98   Pulse 81   Temp 98.4 F (36.9 C) (Oral)   Wt 176 lb (79.8 kg)   SpO2 98%   BMI 25.99 kg/m   Gen: NAD, resting comfortably CV: RRR with no murmurs appreciated Pulm: NWOB, CTAB with no crackles, wheezes, or rhonchi GI: Normal bowel sounds present. Soft, Nontender, Nondistended. GU: Normal external female genitalia. Scant amount of thin white-gray discharge noted at cervical os. No CMT. No adnexal masses noted.  MSK: no edema, cyanosis, or clubbing noted Skin: warm, dry Neuro: grossly normal, moves all extremities Psych: Normal affect and thought content  Results for orders placed or performed in visit on 12/10/15 (from the past 72 hour(s))  Urinalysis Dipstick     Status: None   Collection Time: 12/10/15  4:02 PM  Result Value Ref Range   Color, UA YELLOW    Clarity, UA CLEAR    Glucose, UA NEG    Bilirubin, UA NEG    Ketones, UA NEG    Spec Grav, UA 1.025    Blood, UA NEG    pH, UA 5.5    Protein, UA NEG    Urobilinogen, UA 0.2    Nitrite, UA NEG    Leukocytes, UA Negative Negative  POCT Wet Prep [facility] [place])     Status: Abnormal   Collection Time: 12/10/15  4:15 PM  Result Value Ref Range   Source Wet Prep POC VAG    WBC, Wet Prep HPF POC >20    Bacteria Wet Prep HPF POC Moderate (A) Few   Clue Cells Wet  Prep HPF POC None None   Clue Cells Wet Prep Whiff POC Negative Whiff    Yeast Wet Prep HPF POC None    Trichomonas Wet Prep HPF POC Present (A) Absent   Assessment/Plan:  Abdominal pain, RLQ Concern for recurrent cyst with intermittent torsion. Doubt appendicitis or abscess. No signs of obstruction. UA clear - doubt UTI or pyelonephritis. Will check ultrasound to rule out cyst. Strict return precautions reviewed.   Vaginal Discharge Wet prep with trichomonas. Treated with 2g of flagyl in office. Discussed diagnosis with patient. Instructed patient to talk to last sexual partner. GC/CT, HIV, and RPR pending.   Algis Greenhouse. Jerline Pain, [facility] Resident PGY-3 12/10/2015 4:50 PM

## 2015-12-11 LAB — HIV ANTIBODY (ROUTINE TESTING W REFLEX): HIV: NONREACTIVE

## 2015-12-11 LAB — RPR

## 2015-12-12 LAB — CERVICOVAGINAL ANCILLARY ONLY
CHLAMYDIA, DNA PROBE: NEGATIVE
NEISSERIA GONORRHEA: NEGATIVE

## 2015-12-13 ENCOUNTER — Ambulatory Visit (HOSPITAL_COMMUNITY)
Admission: RE | Admit: 2015-12-13 | Discharge: 2015-12-13 | Disposition: A | Discharge status: Home / Self Care | Payer: PRIVATE HEALTH INSURANCE | Source: Ambulatory Visit | Attending: Family Medicine | Admitting: Family Medicine

## 2015-12-13 DIAGNOSIS — D259 Leiomyoma of uterus, unspecified: Secondary | ICD-10-CM | POA: Insufficient documentation

## 2015-12-13 DIAGNOSIS — R109 Unspecified abdominal pain: Secondary | ICD-10-CM

## 2015-12-13 DIAGNOSIS — R102 Pelvic and perineal pain: Secondary | ICD-10-CM | POA: Diagnosis present

## 2015-12-13 DIAGNOSIS — N800 Endometriosis of uterus: Secondary | ICD-10-CM | POA: Insufficient documentation

## 2015-12-14 ENCOUNTER — Telehealth: Payer: Self-pay | Admitting: Family Medicine

## 2015-12-14 NOTE — Telephone Encounter (Signed)
I have advised patient of Dr. Marigene Ehlers message. Patient would like her referral sent to [facility] at [facility]. I will send it Monday.

## 2015-12-14 NOTE — Telephone Encounter (Signed)
Attempted to call patient with results. No answer. Left VM with call back number. Ultrasound significant for fibroids and adenomyosis. Testing otherwise negative. Will place referral to OBGYN.  Algis Greenhouse. Jerline Pain, [place] Resident PGY-3 12/14/2015 10:42 AM

## 2016-02-05 ENCOUNTER — Other Ambulatory Visit: Payer: Self-pay | Admitting: Family Medicine

## 2016-02-05 DIAGNOSIS — I10 Essential (primary) hypertension: Secondary | ICD-10-CM

## 2016-02-06 NOTE — Telephone Encounter (Signed)
Please call patient and ask her to make an appointment to follow up on her blood pressure.   Phill Myron, D.O. 02/06/2016, 8:39 AM PGY-2, [place]

## 2016-05-21 ENCOUNTER — Other Ambulatory Visit: Payer: Self-pay | Admitting: Internal Medicine

## 2016-05-21 DIAGNOSIS — I10 Essential (primary) hypertension: Secondary | ICD-10-CM

## 2016-05-21 NOTE — Telephone Encounter (Signed)
Patient last had these medications filled in Jan and has not been seen for HTN for some time. I will give 30 day supply but patient needs appointment for her BP.   Phill Myron, D.O. 05/21/2016, 4:07 PM PGY-2, [place]

## 2016-05-21 NOTE — Telephone Encounter (Signed)
LVM for pt to call the office. If pt calls, please give her the info below and schedule appt for BP check/ med refills. Ottis Stain, CMA

## 2016-05-23 NOTE — Telephone Encounter (Signed)
LVm for pt to call back to see about getting her scheduled for an appointment.  When she calls back please assist her in doing this. Katharina Caper, Brent Noto D, [place]

## 2016-06-16 ENCOUNTER — Observation Stay (HOSPITAL_COMMUNITY)
Admission: EM | Admit: 2016-06-16 | Discharge: 2016-06-18 | Disposition: A | Discharge status: Home / Self Care | Payer: Medicaid Other | Attending: Family Medicine | Admitting: Family Medicine

## 2016-06-16 ENCOUNTER — Encounter: Payer: Self-pay | Admitting: Student

## 2016-06-16 ENCOUNTER — Ambulatory Visit (INDEPENDENT_AMBULATORY_CARE_PROVIDER_SITE_OTHER): Payer: PRIVATE HEALTH INSURANCE | Admitting: Student

## 2016-06-16 ENCOUNTER — Encounter (HOSPITAL_COMMUNITY): Payer: Self-pay | Admitting: Emergency Medicine

## 2016-06-16 ENCOUNTER — Emergency Department (HOSPITAL_COMMUNITY): Payer: Medicaid Other

## 2016-06-16 VITALS — BP 210/170 | HR 92 | Temp 98.5°F | Ht 69.0 in | Wt 187.4 lb

## 2016-06-16 DIAGNOSIS — Z9114 Patient's other noncompliance with medication regimen: Secondary | ICD-10-CM | POA: Insufficient documentation

## 2016-06-16 DIAGNOSIS — R0602 Shortness of breath: Secondary | ICD-10-CM | POA: Diagnosis not present

## 2016-06-16 DIAGNOSIS — F17210 Nicotine dependence, cigarettes, uncomplicated: Secondary | ICD-10-CM | POA: Insufficient documentation

## 2016-06-16 DIAGNOSIS — I160 Hypertensive urgency: Secondary | ICD-10-CM

## 2016-06-16 DIAGNOSIS — Z6827 Body mass index (BMI) 27.0-27.9, adult: Secondary | ICD-10-CM | POA: Insufficient documentation

## 2016-06-16 DIAGNOSIS — R011 Cardiac murmur, unspecified: Secondary | ICD-10-CM | POA: Diagnosis not present

## 2016-06-16 DIAGNOSIS — F411 Generalized anxiety disorder: Secondary | ICD-10-CM | POA: Diagnosis not present

## 2016-06-16 DIAGNOSIS — E669 Obesity, unspecified: Secondary | ICD-10-CM | POA: Insufficient documentation

## 2016-06-16 DIAGNOSIS — R748 Abnormal levels of other serum enzymes: Secondary | ICD-10-CM | POA: Diagnosis not present

## 2016-06-16 DIAGNOSIS — F410 Panic disorder [episodic paroxysmal anxiety] without agoraphobia: Secondary | ICD-10-CM | POA: Diagnosis not present

## 2016-06-16 DIAGNOSIS — F329 Major depressive disorder, single episode, unspecified: Secondary | ICD-10-CM | POA: Diagnosis not present

## 2016-06-16 DIAGNOSIS — I10 Essential (primary) hypertension: Secondary | ICD-10-CM

## 2016-06-16 DIAGNOSIS — I169 Hypertensive crisis, unspecified: Secondary | ICD-10-CM

## 2016-06-16 DIAGNOSIS — I161 Hypertensive emergency: Secondary | ICD-10-CM

## 2016-06-16 DIAGNOSIS — R519 Headache, unspecified: Secondary | ICD-10-CM | POA: Insufficient documentation

## 2016-06-16 DIAGNOSIS — R51 Headache: Secondary | ICD-10-CM

## 2016-06-16 DIAGNOSIS — Z79899 Other long term (current) drug therapy: Secondary | ICD-10-CM | POA: Diagnosis not present

## 2016-06-16 DIAGNOSIS — E785 Hyperlipidemia, unspecified: Secondary | ICD-10-CM

## 2016-06-16 LAB — CBC WITH DIFFERENTIAL/PLATELET
Basophils Absolute: 0.1 10*3/uL (ref 0.0–0.1)
Basophils Relative: 0 %
Eosinophils Absolute: 0.5 10*3/uL (ref 0.0–0.7)
Eosinophils Relative: 4 %
HCT: 44.2 % (ref 36.0–46.0)
HEMOGLOBIN: 15.7 g/dL — AB (ref 12.0–15.0)
LYMPHS PCT: 28 %
Lymphs Abs: 3.7 10*3/uL (ref 0.7–4.0)
MCH: 31 pg (ref 26.0–34.0)
MCHC: 35.5 g/dL (ref 30.0–36.0)
MCV: 87.2 fL (ref 78.0–100.0)
Monocytes Absolute: 0.9 10*3/uL (ref 0.1–1.0)
Monocytes Relative: 6 %
NEUTROS PCT: 62 %
Neutro Abs: 8.2 10*3/uL — ABNORMAL HIGH (ref 1.7–7.7)
Platelets: 173 10*3/uL (ref 150–400)
RBC: 5.07 MIL/uL (ref 3.87–5.11)
RDW: 12.5 % (ref 11.5–15.5)
WBC: 13.4 10*3/uL — AB (ref 4.0–10.5)

## 2016-06-16 MED ORDER — CARVEDILOL 12.5 MG PO TABS
6.2500 mg | ORAL_TABLET | Freq: Once | ORAL | Status: AC
  Administered 2016-06-16: 6.25 mg via ORAL
  Filled 2016-06-16: qty 1

## 2016-06-16 MED ORDER — SPIRONOLACTONE 25 MG PO TABS
25.0000 mg | ORAL_TABLET | Freq: Every day | ORAL | Status: DC
  Administered 2016-06-16: 25 mg via ORAL
  Filled 2016-06-16: qty 1

## 2016-06-16 MED ORDER — ACETAMINOPHEN 500 MG PO TABS
1000.0000 mg | ORAL_TABLET | Freq: Once | ORAL | Status: AC
  Administered 2016-06-16: 1000 mg via ORAL
  Filled 2016-06-16: qty 2

## 2016-06-16 MED ORDER — HYDROCHLOROTHIAZIDE 12.5 MG PO CAPS: 12.5000 mg | ORAL_CAPSULE | Freq: Every day | ORAL | Status: DC

## 2016-06-16 MED ORDER — AMLODIPINE BESYLATE 5 MG PO TABS
10.0000 mg | ORAL_TABLET | Freq: Once | ORAL | Status: AC
  Administered 2016-06-16: 10 mg via ORAL
  Filled 2016-06-16: qty 2

## 2016-06-16 MED ORDER — LABETALOL HCL 5 MG/ML IV SOLN
20.0000 mg | Freq: Once | INTRAVENOUS | Status: AC
  Administered 2016-06-16: 20 mg via INTRAVENOUS
  Filled 2016-06-16: qty 4

## 2016-06-16 MED ORDER — METHYLPREDNISOLONE ACETATE 40 MG/ML IJ SUSP: 40.0000 mg | Freq: Once | INTRAMUSCULAR | Status: DC

## 2016-06-16 MED ORDER — LISINOPRIL 20 MG PO TABS
20.0000 mg | ORAL_TABLET | Freq: Once | ORAL | Status: AC
  Administered 2016-06-16: 20 mg via ORAL
  Filled 2016-06-16: qty 1

## 2016-06-16 NOTE — Assessment & Plan Note (Addendum)
Blood pressure 210/170. Repeat blood pressure the same in both arms. She has severe right-sided headache this morning that has improved. She also had right face numbness about 3 hours ago that has resolved. Neuro and cardiopulmonary exam within normal limits. Given her high blood pressure and headache,  it is safe to send her to ED for further evaluation and management as clinic will close in 15 minutes. Advised patient to schedule a follow-up in 2-3 days after that. She may need sleep study +/- renovascular ultrasound although part of her poorly controlled hypertension is likely due to medication noncompliance. She also uses BC powder every day which could be contributing. Advised her to stop BC powder or excessive use of NSAIDs.

## 2016-06-16 NOTE — ED Notes (Signed)
Pt reports being noncompliant with BP medications. Educated pt about rebound hypertension. Pt states headache everyday for about a month. She reports she checks BP at home but can not report a reading. Denies CP, weakness, or trouble talking.

## 2016-06-16 NOTE — ED Triage Notes (Signed)
Pt presents to ED for assessment on HBP after being out of her medications x 1 week.  Patient takes amlodipine, norvasc, and coreg.  Patient states she was having difficulty getting a refill called in from her PCP.  BP 236/176 at triage.

## 2016-06-16 NOTE — Assessment & Plan Note (Signed)
Could be migraine given history of this. I also worry about rebound headache given excessive use of BC powder and NSAIDs. She also have hypertensive crisis which could be contributing to her headache. Neuro exam within normal limits. However, she had right facial numbness about 3 hours ago that has resolved. We'll send her to ED for her blood pressure. Advised her to stop using BC powder and excessive use of NSAIDs at this could cause rebound headache and contribute to her poorly controlled hypertension

## 2016-06-16 NOTE — Progress Notes (Signed)
Subjective:    Katelyn [name] is a 41 y.o. old female here for headache and hypertension on the same day appointment.   HPI Headache: Has history of migraine headache. Headache has been going on for one month almost everyday. She describes the pain as pressure-like. Pain is over the right temporal areas. Pain is similar to her migraine headache but  It was very bad this morning. Pain is now 4/10. She denies photophobia or phonophobia. She had numbness over the right face about 3 hours ago that has resolved. She has been using BC powder daily and ibuprofen as needed. She says she has been out of her blood pressure medications for the last 1 week.  She denies fever, photophobia, phonophobia, numbness or tingling, focal weakness, chest pain, shortness of breath or leg swelling. She admits some lightheadedness.   Smokes about half a pack a day for the last 7 years. She denies drug use. Drinks socially.   Hypertension: seems like she'll resistant hypertension. She is supposedly on 5 different blood pressure medications. She says she has been out of all her blood pressure medication for the last 1 week. It appears that she might have been out of her blood pressure medications for longer than that based on chart review. She had some workup for recent hypertension in the past which were nonrevealing. She didn't have sleep study or renovascular ultrasound.  She admits snoring, morning tiredness & daytime sleepiness.   PMH/Problem List: has Panic disorder with agoraphobia and moderate panic attacks; CIGARETTE SMOKER; Severe hypertension; GAD (generalized anxiety disorder); Elevated LFTs; Migraine headache without aura; Atypical chest pain; Snoring; Abdominal pain, RLQ; Ovarian torsion; Breast lump in upper outer quadrant; Acute kidney injury ([place]); Headache; and Hypertensive crisis on her problem list.   has a past medical history of Anemia; Anxiety; Chest pain; Depression; Hypertension; Migraine; and  Palpitations.  FH:  Family History  Problem Relation Age of Onset  . Diabetes Mother   . Depression Mother   . Hypertension Mother   . Hypertension Father   . Hypertension Brother   . Hypertension Brother     [facility] Social History  Substance Use Topics  . Smoking status: Current Every Day Smoker    Packs/day: 0.50    Types: Cigarettes    Start date: 01/06/1994  . Smokeless tobacco: Never Used     Comment: smoking 2 cigs daily; trying to quit  . 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     Objective:     Vitals:   06/16/16 1611 06/16/16 1640  BP: (!) 210/170 (!) 210/170  Pulse: 92   Temp: 98.5 F (36.9 C)   TempSrc: Oral   SpO2: 99%   Weight: 187 lb 6.4 oz (85 kg)   Height: 5\' 9"  (1.753 m)    Physical Exam GEN: appears well, no apparent distress. Head: normocephalic and atraumatic  Eyes: conjunctiva without injection, sclera anicteric, PERRL, EOMI.  HEM: negative for cervical or periauricular lymphadenopathies CVS: RRR, nl S1&S2, no murmurs, no edema, negative for carotid bruits RESP: no IWOB, good air movement bilaterally, CTAB MSK: no focal tenderness or notable swelling SKIN: no apparent skin lesion NEURO: Awake, alert and oriented 4, CN II-12 intact, motor 5/5 in upper and lower extremities, light sensation intact in all dermatomes, biceps and patellar reflex 2+ bilaterally, finger-to-nose intact  bilaterally, normal gait PSYCH: euthymic mood with congruent affect    Assessment and Plan:  Hypertensive crisis Blood pressure 210/170. Repeat blood pressure the same in both arms. She has severe right-sided headache this morning that has improved. She also had right face numbness about 3 hours ago that has resolved. Neuro and cardiopulmonary exam within normal limits. Given her high blood pressure and headache,   it is safe to send her to ED for further evaluation and management as clinic will close in 15 minutes. Advised patient to schedule a follow-up in 2-3 days after that. She may need sleep study +/- renovascular ultrasound although part of her poorly controlled hypertension is likely due to medication noncompliance. She also uses BC powder every day which could be contributing. Advised her to stop BC powder or excessive use of NSAIDs.  Headache Could be migraine given history of this. I also worry about rebound headache given excessive use of BC powder and NSAIDs. She also have hypertensive crisis which could be contributing to her headache. Neuro exam within normal limits. However, she had right facial numbness about 3 hours ago that has resolved. We'll send her to ED for her blood pressure. Advised her to stop using BC powder and excessive use of NSAIDs at this could cause rebound headache and contribute to her poorly controlled hypertension  Return in about 2 days (around 06/18/2016) for Hypertension.  Mercy Riding, MD 06/16/16 Pager: [phone]  Precepted patient with Dr. Nori Riis.

## 2016-06-16 NOTE — ED Provider Notes (Signed)
[facility] DEPT Provider Note   CSN: [id number] Arrival date & time: 06/16/16  1858  By signing my name below, I, Evelene Croon, attest that this documentation has been prepared under the direction and in the presence of Sherwood Gambler, MD . Electronically Signed: Evelene Croon, Scribe. 06/16/2016. 11:20 PM.  History   Chief Complaint Chief Complaint  Patient presents with  . Hypertension  . Headache    The history is provided by the patient. No language interpreter was used.     HPI Comments:  Katelyn [name] is a 41 y.o. female with a history of migraines and HTN, who presents to the Emergency Department complaining of HA that began this AM ~0700, onset while watching TV. She notes pain today is located in the right frontal region. She states she has been having daily HAs x 1 month. She notes same pain but more intense today. Started off gradually and quickly worsened this AM. She rates her pain a 7/10 at this time. Pt reports associated elevated BP, and a brief 15 min episode of SOB that has resolved at this time. She ran out of her BP meds 1 week ago but was not taking them as prescribed. She states she was taking them based on how she felt.  She denies CP, weakness/numbness, speech changes, or vision changes. Pt was seen at PCP's office today and was sent to ED for further evaluation. No alleviating factors noted.   Past Medical History:  Diagnosis Date  . Anemia   . Anxiety   . Chest pain   . Depression   . Hypertension   . Migraine   . Palpitations     Patient Active Problem List   Diagnosis Date Noted  . Headache 06/16/2016  . Hypertensive crisis 06/16/2016  . Acute kidney injury ([place]) 01/14/2015  . Breast lump in upper outer quadrant 12/28/2014  . Ovarian torsion 11/25/2014  . Snoring 06/23/2014  . Abdominal pain, RLQ 06/23/2014  . Atypical chest pain 01/30/2014  . Migraine headache without aura 07/29/2012  . Elevated LFTs 04/26/2012  . GAD (generalized  anxiety disorder) 03/20/2011  . Panic disorder with agoraphobia and moderate panic attacks 02/26/2009  . CIGARETTE SMOKER 11/24/2008  . Severe hypertension 03/05/2006    Past Surgical History:  Procedure Laterality Date  . HYSTEROSCOPY W/ ENDOMETRIAL ABLATION  2010  . LAPAROSCOPY N/A 11/25/2014   Procedure: LAPAROSCOPY OPERATIVE WITH LEFT OVARIAN CYSTECTOMY;  Surgeon: Osborne Oman, MD;  Location: [facility];  Service: Gynecology;  Laterality: N/A;  . TUBAL LIGATION  2006    OB History    Gravida Para Term Preterm AB Living   6 3 3   3 3    SAB TAB Ectopic Multiple Live Births     3             Home Medications    Prior to Admission medications   Medication Sig Start Date End Date Taking? Authorizing Provider  amLODipine (NORVASC) 10 MG tablet Take 1 tablet (10 mg total) by mouth daily. 11/01/15   Dorie Rank, MD  atorvastatin (LIPITOR) 40 MG tablet Take 1 tablet (40 mg total) by mouth daily. Patient not taking: Reported on 10/31/2015 02/05/14   Hilton Sinclair, MD  carvedilol (COREG) 6.25 MG tablet TAKE ONE TABLET BY MOUTH TWICE DAILY WITH  A  MEAL. 05/21/16   Nicolette Bang, DO  docusate sodium (COLACE) 100 MG capsule Take 1 capsule (100 mg total) by mouth 2 (two) times daily as needed.  Patient not taking: Reported on 10/31/2015 11/25/14   Anyanwu, Sallyanne Havers, MD  ibuprofen (ADVIL,MOTRIN) 600 MG tablet Take 1 tablet (600 mg total) by mouth every 8 (eight) hours as needed. Patient not taking: Reported on 10/31/2015 06/15/15   Mariel Aloe, MD  lisinopril-hydrochlorothiazide (PRINZIDE,ZESTORETIC) 20-12.5 MG tablet TAKE ONE TABLET BY MOUTH ONCE DAILY 05/21/16   Nicolette Bang, DO  oxyCODONE-acetaminophen (PERCOCET/ROXICET) 5-325 MG tablet Take 1-2 tablets by mouth every 6 (six) hours as needed. Patient not taking: Reported on 10/31/2015 11/25/14   Osborne Oman, MD  sertraline (ZOLOFT) 100 MG tablet TAKE ONE TABLET BY MOUTH ONCE DAILY Patient not taking:  Reported on 10/31/2015 03/29/15   Mariel Aloe, MD  spironolactone (ALDACTONE) 25 MG tablet Take 1 tablet (25 mg total) by mouth daily. 11/01/15   Dorie Rank, MD  spironolactone (ALDACTONE) 25 MG tablet TAKE ONE TABLET BY MOUTH ONCE DAILY 02/06/16   Nicolette Bang, DO    Family History Family History  Problem Relation Age of Onset  . Diabetes Mother   . Depression Mother   . Hypertension Mother   . Hypertension Father   . Hypertension Brother   . Hypertension Brother     Social History Social History  Substance Use Topics  . Smoking status: Current Every Day Smoker    Packs/day: 0.50    Types: Cigarettes    Start date: 01/06/1994  . Smokeless tobacco: Never Used     Comment: smoking 2 cigs daily; trying to quit  . 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  Respiratory: Positive for shortness of breath (resolved).   Cardiovascular: Negative for chest pain.  Neurological: Positive for headaches. Negative for speech difficulty, weakness and numbness.  All other systems reviewed and are negative.    Physical Exam Updated Vital Signs BP (!) 207/151   Pulse 83   Temp 98.7 F (37.1 C) (Oral)   Resp 20   SpO2 96%   Physical Exam  Constitutional: She is oriented to person, place, and time. She appears well-developed and well-nourished.  HENT:  Head: Normocephalic and atraumatic.  Right Ear: External ear normal.  Left Ear: External ear normal.  Nose: Nose normal.  Eyes: EOM are normal. Pupils are equal, round, and reactive to light. Right eye exhibits no discharge. Left eye exhibits no discharge.  Neck: Normal range of motion. Neck supple.  Cardiovascular: Normal rate, regular rhythm and normal heart sounds.   Pulmonary/Chest: Effort normal and breath sounds normal.  Abdominal: Soft.  There is no tenderness.  Neurological: She is alert and oriented to person, place, and time.  CN 3-12 grossly intact. 5/5 strength in all 4 extremities. Grossly normal sensation. Normal finger to nose.   Skin: Skin is warm and dry.  Nursing note and vitals reviewed.    ED Treatments / Results  DIAGNOSTIC STUDIES:  Oxygen Saturation is 99% on RA, normal by my interpretation.    COORDINATION OF CARE:  11:12 PM Discussed treatment plan with pt at bedside and pt agreed to plan.  Labs (all labs ordered are listed, but only abnormal results are displayed) Labs Reviewed  COMPREHENSIVE METABOLIC PANEL - Abnormal; Notable for the following:       Result Value   Glucose, Bld 131 (*)  Creatinine, Ser 1.15 (*)    GFR calc non Af Amer 58 (*)    All other components within normal limits  TROPONIN I - Abnormal; Notable for the following:    Troponin I 0.06 (*)    All other components within normal limits  CBC WITH DIFFERENTIAL/PLATELET - Abnormal; Notable for the following:    WBC 13.4 (*)    Hemoglobin 15.7 (*)    Neutro Abs 8.2 (*)    All other components within normal limits    EKG  EKG Interpretation  Date/Time:  Monday June 16 2016 23:18:46 EDT Ventricular Rate:  88 PR Interval:    QRS Duration: 95 QT Interval:  401 QTC Calculation: 486 R Axis:   -40 Text Interpretation:  Sinus rhythm Probable left atrial enlargement Left ventricular hypertrophy Borderline T abnormalities, inferior leads Borderline prolonged QT interval nonspecific T waves similar to Feb 2017 Confirmed by Sherwood Gambler [phone]) on 06/16/2016 11:24:08 PM       Radiology Dg Chest 2 View  Result Date: 06/16/2016 CLINICAL DATA:  Acute onset of shortness of breath. High blood pressure. Subacute onset of headache. Initial encounter. EXAM: CHEST  2 VIEW COMPARISON:  Chest radiograph performed 03/01/2015 FINDINGS: The lungs are well-aerated and clear. There is no evidence of focal opacification, pleural effusion  or pneumothorax. The heart is borderline normal in size. No acute osseous abnormalities are seen. IMPRESSION: No acute cardiopulmonary process seen. Electronically Signed   By: Garald Balding M.D.   On: 06/16/2016 23:48    Procedures Procedures (including critical care time)  CRITICAL CARE Performed by: Sherwood Gambler T   Total critical care time: 30 minutes  Critical care time was exclusive of separately billable procedures and treating other patients.  Critical care was necessary to treat or prevent imminent or life-threatening deterioration.  Critical care was time spent personally by me on the following activities: development of treatment plan with patient and/or surrogate as well as nursing, discussions with consultants, evaluation of patient's response to treatment, examination of patient, obtaining history from patient or surrogate, ordering and performing treatments and interventions, ordering and review of laboratory studies, ordering and review of radiographic studies, pulse oximetry and re-evaluation of patient's condition.   Medications Ordered in ED Medications  hydrochlorothiazide (MICROZIDE) capsule 12.5 mg (not administered)  spironolactone (ALDACTONE) tablet 25 mg (25 mg Oral Given 06/16/16 2336)  labetalol (NORMODYNE,TRANDATE) injection 20 mg (20 mg Intravenous Given 06/16/16 2334)  amLODipine (NORVASC) tablet 10 mg (10 mg Oral Given 06/16/16 1000)  carvedilol (COREG) tablet 6.25 mg (6.25 mg Oral Given 06/16/16 2334)  lisinopril (PRINIVIL,ZESTRIL) tablet 20 mg (20 mg Oral Given 06/16/16 2334)  acetaminophen (TYLENOL) tablet 1,000 mg (1,000 mg Oral Given 06/16/16 2334)  labetalol (NORMODYNE,TRANDATE) injection 40 mg (40 mg Intravenous Given 06/17/16 0017)     Initial Impression / Assessment and Plan / ED Course  I have reviewed the triage vital signs and the nursing notes.  Pertinent labs & imaging results that were available during my care of the patient were reviewed  by me and considered in my medical decision making (see chart for details).  Clinical Course as of Jun 17 999  Mon Jun 16, 2016  2318 Patient's HA was worse this AM than typical, but also gradual. Neuro exam unremarkable. No current or transient neuro symptoms. Her transient SOB this AM is of unclear etiology, will eval with CXR, ECG, troponin. Labetalol given her excessive HTN and symptoms, give PO meds as well  [SG]  Tue  Jun 17, 2016  0033 Patient's HA has completely resolved. However she is still quite hypertensive, systolic 301S. Given this with some evidence of heart strain (trop 0.06) and poorly controlled BP, will admit to family practice.   [SG]  [id number] Patient is now complaining of a brief, severe headache that spontaneously resolved after about 10 minutes while in the ED. She had an episode of emesis during this time., Given all these findings, will get CT head. Currently asymptomatic.  [SG]    Clinical Course User Index [SG] Sherwood Gambler, MD    Patient's BP is improving. Family practice to admit for better BP control.   Final Clinical Impressions(s) / ED Diagnoses   Final diagnoses:  Malignant hypertensive urgency    New Prescriptions New Prescriptions   No medications on file   I personally performed the services described in this documentation, which was scribed in my presence. The recorded information has been reviewed and is accurate.     Sherwood Gambler, MD 06/17/16 1001

## 2016-06-16 NOTE — Patient Instructions (Signed)
It was great seeing you today! We have addressed the following issues today 1. Blood pressure: please go to emergency department right away. Please schedule a follow-up in 2-3 days in clinic.   If we did any lab work today, and the results require attention, either me or my nurse will get in touch with you. If everything is normal, you will get a letter in mail and a message via . If you don't hear from [place] in two weeks, please give [place] a call. Otherwise, we look forward to seeing you again at your next visit. If you have any questions or concerns before then, please call the clinic at [phone].  Please bring all your medications to every doctors visit  Sign up for My Chart to have easy access to your labs results, and communication with your Primary care physician.    Please check-out at the front desk before leaving the clinic.    Take Care,   Dr. Cyndia Skeeters

## 2016-06-16 NOTE — ED Notes (Signed)
ED Provider at bedside. 

## 2016-06-17 ENCOUNTER — Encounter (HOSPITAL_COMMUNITY): Payer: Self-pay

## 2016-06-17 ENCOUNTER — Observation Stay (HOSPITAL_COMMUNITY): Payer: Medicaid Other

## 2016-06-17 DIAGNOSIS — F410 Panic disorder [episodic paroxysmal anxiety] without agoraphobia: Secondary | ICD-10-CM | POA: Diagnosis not present

## 2016-06-17 DIAGNOSIS — R748 Abnormal levels of other serum enzymes: Secondary | ICD-10-CM

## 2016-06-17 DIAGNOSIS — I161 Hypertensive emergency: Secondary | ICD-10-CM

## 2016-06-17 DIAGNOSIS — I160 Hypertensive urgency: Secondary | ICD-10-CM

## 2016-06-17 DIAGNOSIS — F411 Generalized anxiety disorder: Secondary | ICD-10-CM | POA: Diagnosis not present

## 2016-06-17 DIAGNOSIS — R51 Headache: Secondary | ICD-10-CM

## 2016-06-17 DIAGNOSIS — N179 Acute kidney failure, unspecified: Secondary | ICD-10-CM

## 2016-06-17 DIAGNOSIS — I169 Hypertensive crisis, unspecified: Secondary | ICD-10-CM | POA: Diagnosis not present

## 2016-06-17 DIAGNOSIS — Z79899 Other long term (current) drug therapy: Secondary | ICD-10-CM | POA: Diagnosis not present

## 2016-06-17 LAB — TROPONIN I
TROPONIN I: 0.05 ng/mL — AB (ref <0.03)
TROPONIN I: 0.05 ng/mL — AB (ref <0.03)
Troponin I: 0.06 ng/mL (ref <0.03)
Troponin I: 0.05 ng/mL (ref <0.03)

## 2016-06-17 LAB — COMPREHENSIVE METABOLIC PANEL
ALBUMIN: 4.4 g/dL (ref 3.5–5.0)
ALT: 28 U/L (ref 14–54)
ANION GAP: 8 (ref 5–15)
AST: 26 U/L (ref 15–41)
Alkaline Phosphatase: 73 U/L (ref 38–126)
BUN: 13 mg/dL (ref 6–20)
CALCIUM: 9.4 mg/dL (ref 8.9–10.3)
CO2: 25 mmol/L (ref 22–32)
Chloride: 103 mmol/L (ref 101–111)
Creatinine, Ser: 1.15 mg/dL — ABNORMAL HIGH (ref 0.44–1.00)
GFR calc non Af Amer: 58 mL/min — ABNORMAL LOW (ref >60)
GLUCOSE: 131 mg/dL — AB (ref 65–99)
POTASSIUM: 3.6 mmol/L (ref 3.5–5.1)
SODIUM: 136 mmol/L (ref 135–145)
TOTAL PROTEIN: 7.5 g/dL (ref 6.5–8.1)
Total Bilirubin: 0.6 mg/dL (ref 0.3–1.2)

## 2016-06-17 LAB — CBC
HCT: 41.6 % (ref 36.0–46.0)
HCT: 39.5 % (ref 36.0–46.0)
HEMOGLOBIN: 14.9 g/dL (ref 12.0–15.0)
Hemoglobin: 14 g/dL (ref 12.0–15.0)
MCH: 31.1 pg (ref 26.0–34.0)
MCH: 30.8 pg (ref 26.0–34.0)
MCHC: 35.8 g/dL (ref 30.0–36.0)
MCHC: 35.4 g/dL (ref 30.0–36.0)
MCV: 86.8 fL (ref 78.0–100.0)
MCV: 87 fL (ref 78.0–100.0)
PLATELETS: 178 10*3/uL (ref 150–400)
Platelets: 166 10*3/uL (ref 150–400)
RBC: 4.79 MIL/uL (ref 3.87–5.11)
RBC: 4.54 MIL/uL (ref 3.87–5.11)
RDW: 12.6 % (ref 11.5–15.5)
RDW: 12.6 % (ref 11.5–15.5)
WBC: 12 10*3/uL — AB (ref 4.0–10.5)
WBC: 11.7 10*3/uL — AB (ref 4.0–10.5)

## 2016-06-17 LAB — RAPID URINE DRUG SCREEN, HOSP PERFORMED
Amphetamines: NOT DETECTED
BENZODIAZEPINES: NOT DETECTED
Barbiturates: NOT DETECTED
Cocaine: NOT DETECTED
Opiates: NOT DETECTED
Tetrahydrocannabinol: NOT DETECTED

## 2016-06-17 LAB — LIPID PANEL
CHOL/HDL RATIO: 5 ratio
CHOLESTEROL: 199 mg/dL (ref 0–200)
HDL: 40 mg/dL — ABNORMAL LOW (ref >40)
LDL Cholesterol: 129 mg/dL — ABNORMAL HIGH (ref 0–99)
Triglycerides: 150 mg/dL — ABNORMAL HIGH (ref <150)
VLDL: 30 mg/dL (ref 0–40)

## 2016-06-17 LAB — URINALYSIS, ROUTINE W REFLEX MICROSCOPIC
BACTERIA UA: NONE SEEN
Bilirubin Urine: NEGATIVE
Glucose, UA: NEGATIVE mg/dL
Ketones, ur: NEGATIVE mg/dL
Leukocytes, UA: NEGATIVE
NITRITE: NEGATIVE
Protein, ur: 30 mg/dL — AB
SPECIFIC GRAVITY, URINE: 1.012 (ref 1.005–1.030)
pH: 6 (ref 5.0–8.0)

## 2016-06-17 LAB — BASIC METABOLIC PANEL
Anion gap: 11 (ref 5–15)
BUN: 14 mg/dL (ref 6–20)
CO2: 21 mmol/L — ABNORMAL LOW (ref 22–32)
CREATININE: 1.16 mg/dL — AB (ref 0.44–1.00)
Calcium: 9 mg/dL (ref 8.9–10.3)
Chloride: 104 mmol/L (ref 101–111)
GFR, EST NON AFRICAN AMERICAN: 57 mL/min — AB (ref >60)
Glucose, Bld: 105 mg/dL — ABNORMAL HIGH (ref 65–99)
Potassium: 3.4 mmol/L — ABNORMAL LOW (ref 3.5–5.1)
SODIUM: 136 mmol/L (ref 135–145)

## 2016-06-17 LAB — TSH: TSH: 3.659 u[IU]/mL (ref 0.350–4.500)

## 2016-06-17 LAB — CREATININE, SERUM
CREATININE: 1.12 mg/dL — AB (ref 0.44–1.00)
GFR calc non Af Amer: 60 mL/min — ABNORMAL LOW (ref >60)

## 2016-06-17 LAB — PREGNANCY, URINE: PREG TEST UR: NEGATIVE

## 2016-06-17 MED ORDER — ACETAMINOPHEN 650 MG RE SUPP: 650.0000 mg | Freq: Four times a day (QID) | RECTAL | Status: DC | PRN

## 2016-06-17 MED ORDER — HYDRALAZINE HCL 20 MG/ML IJ SOLN
5.0000 mg | INTRAMUSCULAR | Status: DC | PRN
  Administered 2016-06-17: 5 mg via INTRAVENOUS
  Filled 2016-06-17: qty 1

## 2016-06-17 MED ORDER — ONDANSETRON HCL 4 MG/2ML IJ SOLN
4.0000 mg | Freq: Three times a day (TID) | INTRAMUSCULAR | Status: DC | PRN
  Administered 2016-06-17: 4 mg via INTRAVENOUS
  Filled 2016-06-17: qty 2

## 2016-06-17 MED ORDER — LISINOPRIL 10 MG PO TABS
20.0000 mg | ORAL_TABLET | Freq: Every day | ORAL | Status: DC
  Administered 2016-06-17 – 2016-06-18 (×2): 20 mg via ORAL
  Filled 2016-06-17 (×2): qty 2

## 2016-06-17 MED ORDER — ASPIRIN EC 81 MG PO TBEC: 81.0000 mg | DELAYED_RELEASE_TABLET | Freq: Every day | ORAL | Status: DC

## 2016-06-17 MED ORDER — HYDROCHLOROTHIAZIDE 12.5 MG PO CAPS
12.5000 mg | ORAL_CAPSULE | Freq: Every day | ORAL | Status: DC
  Administered 2016-06-18: 12.5 mg via ORAL
  Filled 2016-06-17: qty 1

## 2016-06-17 MED ORDER — LABETALOL HCL 5 MG/ML IV SOLN
40.0000 mg | Freq: Once | INTRAVENOUS | Status: AC
  Administered 2016-06-17: 40 mg via INTRAVENOUS
  Filled 2016-06-17: qty 8

## 2016-06-17 MED ORDER — ACETAMINOPHEN 325 MG PO TABS
650.0000 mg | ORAL_TABLET | Freq: Four times a day (QID) | ORAL | Status: DC | PRN
  Administered 2016-06-17 – 2016-06-18 (×3): 650 mg via ORAL
  Filled 2016-06-17 (×3): qty 2

## 2016-06-17 MED ORDER — ENOXAPARIN SODIUM 40 MG/0.4ML ~~LOC~~ SOLN
40.0000 mg | Freq: Every day | SUBCUTANEOUS | Status: DC
  Administered 2016-06-17 – 2016-06-18 (×2): 40 mg via SUBCUTANEOUS
  Filled 2016-06-17 (×2): qty 0.4

## 2016-06-17 MED ORDER — CARVEDILOL 12.5 MG PO TABS
6.2500 mg | ORAL_TABLET | Freq: Two times a day (BID) | ORAL | Status: DC
  Administered 2016-06-17 – 2016-06-18 (×3): 6.25 mg via ORAL
  Filled 2016-06-17 (×3): qty 1

## 2016-06-17 MED ORDER — SODIUM CHLORIDE 0.9% FLUSH
3.0000 mL | Freq: Two times a day (BID) | INTRAVENOUS | Status: DC
  Administered 2016-06-17 (×2): 3 mL via INTRAVENOUS

## 2016-06-17 MED ORDER — AMLODIPINE BESYLATE 5 MG PO TABS
10.0000 mg | ORAL_TABLET | Freq: Every day | ORAL | Status: DC
  Administered 2016-06-18: 10 mg via ORAL
  Filled 2016-06-17: qty 2

## 2016-06-17 MED ORDER — SPIRONOLACTONE 25 MG PO TABS
25.0000 mg | ORAL_TABLET | Freq: Every day | ORAL | Status: DC
  Administered 2016-06-18: 25 mg via ORAL
  Filled 2016-06-17: qty 1

## 2016-06-17 MED ORDER — LISINOPRIL-HYDROCHLOROTHIAZIDE 20-12.5 MG PO TABS: 1.0000 | ORAL_TABLET | Freq: Every day | ORAL | Status: DC

## 2016-06-17 MED ORDER — IBUPROFEN 600 MG PO TABS
600.0000 mg | ORAL_TABLET | Freq: Four times a day (QID) | ORAL | Status: DC | PRN
  Administered 2016-06-17 – 2016-06-18 (×2): 600 mg via ORAL
  Filled 2016-06-17 (×2): qty 1

## 2016-06-17 NOTE — H&P (Signed)
[facility] Admission History and Physical Service Pager: [phone]  Patient name: Katelyn [name] Medical record number: [id number] Date of birth: January 19, 1975 Age: 41 y.o. Gender: female  Primary Care Provider: Nicolette Bang, DO Consultants: None Code Status: Full  Chief Complaint:   Assessment and Plan: Katelyn [name] is a 41 y.o. female presenting with persistent headaches and concern for hypertensive emergency. PMH is significant for resistant hypertension, GAD and panic attacks, and tobacco abuse.    #Hypertentive urgency vs emergency: SBP in high 200s over 100s. In setting of running out of BP medication. Elevated troponin (0.06) may represent end organ damage, though EKG unchanged and patient without chest pain. No vision changes, nausea, vomiting, or focal deficits to suggest increased intracranial pressure. SCr stable at 1.15 (BL ~ 1.10), though does report increased urinary frequency today. Little improvement s/p 20 mg IV labetalol given in ED.  - Admit to telemetry, Attending Dr. Ree Kida - s/p 20 mg IV labetalol, 40 mg IV labetalol in ED - home medications restarted (lisinopril-hctz 20-12.5 mg, coreg 6.25 mg BID, amlodipine 10 mg, spironolactone 25 mg) - prn hydralazine 5 mg ordered for SBP > 180 and DBP > 100 - Obtain renal US - Obtain TSH, hgb A1c, lipid panel - Obtain upreg, UA (looking for hematuria, proteinuria), UDS - Could consider aldactone/renin ratio but on spironolactone - Trend troponins - Would recommend outpatient sleep study, as has history of snoring  #Headaches: Improved after labetalol and tylenol in ED. No focal deficits to suggest intracranial process. With frequent use of BC powder, suspect rebound effect contributing. - Continue to monitor  #Troponinemia: Suspect 2/2 demand in setting of severely elevated BP. No CP or new EKG changes.  - Trend troponins - a.m EKG  #Tobacco abuse: Smoking about 0.5 PPD. Quit for  7 years but restarted after marriage ended.  - Provide tobacco cessation counseling - Patient declines nicotine patch  #Sudden shortness of breath: Resolved. CXR clear. Could consider R heart failure in setting of chronic elevated BPs but does not explain sudden onset and resolution. Could have been bronchospasm or panic attack.  - Continue to monitor - Consider switch from carvedilol to cardioselective BB (though had nausea with metoprolol)  FEN/GI: heart healthy Prophylaxis: lovenox  Disposition: Pending improved BP control   History of Present Illness:  Katelyn [name] is a 41 y.o. female presenting with headaches x 1 month. She presented to [facility] and was found to have BP of 210/70. Headache began this morning and did not go away. Describes it as sharp. Reports almost daily headaches this month, for which she has been taking BC powder daily and ibuprofen prn. Sometimes these are worsened by light but not noise. Not associated with nausea, vomiting, or vision changes. She has been out of her BP medications for about 1 week. She denies cost as much of an issue--says they are affordable, though she thinks amlodipine is a little more expensive than the others. She misses doses about twice a week, usually on the weekends, but it may be more often than that.   New symptoms today include a brief 15 minute episode of SOB and increased urinary frequency.   Patient reports BP has been an issue since she was 17. BP was especially hard to control during her 3 pregnancies. She is currently not on any birth control and is sexually active. Had been referred for sleep study and renal US had been ordered in past, but neither were performed.  Both patient's mother and her twin brothers have difficult-to-control BP and are on multiple medications, per patient.   Review Of Systems: Per HPI with the following additions:   Review of Systems  Constitutional: Negative for chills and fever.  HENT: Negative for  hearing loss, sinus pain and tinnitus.   Eyes: Negative for blurred vision and double vision.  Respiratory: Negative for cough.   Cardiovascular: Negative for chest pain, palpitations and leg swelling.  Gastrointestinal: Negative for abdominal pain, nausea and vomiting.  Genitourinary: Positive for frequency. Negative for flank pain and urgency.  Musculoskeletal: Negative for falls and myalgias.  Skin: Negative for rash.  Neurological: Positive for dizziness and headaches. Negative for sensory change, focal weakness, loss of consciousness and weakness.    Patient Active Problem List   Diagnosis Date Noted  . Headache 06/16/2016  . Hypertensive crisis 06/16/2016  . Acute kidney injury ([place]) 01/14/2015  . Breast lump in upper outer quadrant 12/28/2014  . Ovarian torsion 11/25/2014  . Snoring 06/23/2014  . Abdominal pain, RLQ 06/23/2014  . Atypical chest pain 01/30/2014  . Migraine headache without aura 07/29/2012  . Elevated LFTs 04/26/2012  . GAD (generalized anxiety disorder) 03/20/2011  . Panic disorder with agoraphobia and moderate panic attacks 02/26/2009  . CIGARETTE SMOKER 11/24/2008  . Severe hypertension 03/05/2006    Past Medical History: Past Medical History:  Diagnosis Date  . Anemia   . Anxiety   . Chest pain   . Depression   . Hypertension   . Migraine   . Palpitations     Past Surgical History: Past Surgical History:  Procedure Laterality Date  . HYSTEROSCOPY W/ ENDOMETRIAL ABLATION  2010  . LAPAROSCOPY N/A 11/25/2014   Procedure: LAPAROSCOPY OPERATIVE WITH LEFT OVARIAN CYSTECTOMY;  Surgeon: Osborne Oman, MD;  Location: [facility];  Service: Gynecology;  Laterality: N/A;  . TUBAL LIGATION  2006    Social History: Social History  Substance Use Topics  . Smoking status: Current Every Day Smoker    Packs/day: 0.50    Types: Cigarettes    Start date: 01/06/1994  . Smokeless tobacco: Never Used     Comment: smoking 2 cigs daily; trying to quit  .  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 Smokes 1/2 PPD for 7-8 years. Drinks EtOH maybe once a week. No illegal drug use.  Please also refer to relevant sections of EMR.  Family History: Family History  Problem Relation Age of Onset  . Diabetes Mother   . Depression Mother   . Hypertension Mother   . Hypertension Father   . Hypertension Brother   . Hypertension Brother     Allergies and Medications: Allergies  Allergen Reactions  . Metoprolol Nausea Only    Makes her feel very sick when taking med   No current facility-administered medications on file prior to encounter.    Current Outpatient Prescriptions on File Prior to Encounter  Medication Sig Dispense Refill  . amLODipine (NORVASC) 10 MG tablet Take 1 tablet (10 mg total) by mouth daily. 30 tablet 1  . atorvastatin (LIPITOR) 40 MG tablet Take 1 tablet (40 mg total) by mouth daily. (Patient not taking: Reported on 10/31/2015) 30 tablet 3  . carvedilol (COREG) 6.25 MG tablet TAKE ONE TABLET BY MOUTH TWICE DAILY WITH  A  MEAL. 60 tablet 0  .  docusate sodium (COLACE) 100 MG capsule Take 1 capsule (100 mg total) by mouth 2 (two) times daily as needed. (Patient not taking: Reported on 10/31/2015) 30 capsule 2  . ibuprofen (ADVIL,MOTRIN) 600 MG tablet Take 1 tablet (600 mg total) by mouth every 8 (eight) hours as needed. (Patient not taking: Reported on 10/31/2015) 30 tablet 0  . lisinopril-hydrochlorothiazide (PRINZIDE,ZESTORETIC) 20-12.5 MG tablet TAKE ONE TABLET BY MOUTH ONCE DAILY 30 tablet 0  . oxyCODONE-acetaminophen (PERCOCET/ROXICET) 5-325 MG tablet Take 1-2 tablets by mouth every 6 (six) hours as needed. (Patient not taking: Reported on 10/31/2015) 30 tablet 0  . sertraline (ZOLOFT) 100 MG tablet TAKE ONE TABLET BY MOUTH ONCE DAILY (Patient not taking: Reported on 10/31/2015) 30  tablet 0  . spironolactone (ALDACTONE) 25 MG tablet Take 1 tablet (25 mg total) by mouth daily. 30 tablet 1  . spironolactone (ALDACTONE) 25 MG tablet TAKE ONE TABLET BY MOUTH ONCE DAILY 30 tablet 0  . [DISCONTINUED] citalopram (CELEXA) 10 MG tablet Take 1 tablet (10 mg total) by mouth daily. 30 tablet 2  . [DISCONTINUED] omeprazole (PRILOSEC) 20 MG capsule Take 1 capsule (20 mg total) by mouth daily. 30 capsule 2  . [DISCONTINUED] potassium chloride SA (K-DUR,KLOR-CON) 20 MEQ tablet Take 1 tablet (20 mEq total) by mouth daily. For 7 days 7 tablet 0    Objective: BP (!) 234/167   Pulse 92   Temp 98.7 F (37.1 C) (Oral)   Resp 20   SpO2 99%   Exam: General: Pleasant, well appearing female in NAD Eyes: EOMI, PERRL ENTM: MMM, normal posterior oropharynx Neck: Supple, FROM Cardiovascular: RRR, S1, S2, no mrg; no abdominal bruits appreciated Respiratory: CTAB, no increased WOB Gastrointestinal: +BS, Soft, no TTP MSK: No LE edema. Moves all extremities spontaneously.  Derm: No rashes or lesions of exposed skin.  Neuro: AOx3. CNII-VII intact. Strength 5/5 of UEs, LEs. Normal rapid-alternating movements, finger-nose-finger Psych: Normal mood and affect  Labs and Imaging: CBC BMET   Recent Labs Lab 06/16/16 2315  WBC 13.4*  HGB 15.7*  HCT 44.2  PLT 173    Recent Labs Lab 06/16/16 2315  NA 136  K 3.6  CL 103  CO2 25  BUN 13  CREATININE 1.15*  GLUCOSE 131*  CALCIUM 9.4     Dg Chest 2 View  Result Date: 06/16/2016 CLINICAL DATA:  Acute onset of shortness of breath. High blood pressure. Subacute onset of headache. Initial encounter. EXAM: CHEST  2 VIEW COMPARISON:  Chest radiograph performed 03/01/2015 FINDINGS: The lungs are well-aerated and clear. There is no evidence of focal opacification, pleural effusion or pneumothorax. The heart is borderline normal in size. No acute osseous abnormalities are seen. IMPRESSION: No acute cardiopulmonary process seen. Electronically  Signed   By: Garald Balding M.D.   On: 06/16/2016 23:48    Rogue Bussing, MD 06/17/2016, 12:24 AM PGY-2, [facility] pager: [phone], text pages welcome

## 2016-06-17 NOTE — ED Notes (Signed)
NT at bedside reports pt having episodes of emesis. Pt denies nausea at this time, after the last episode. Reporting a headache again.

## 2016-06-17 NOTE — Progress Notes (Signed)
Patient received to room from ED and oriented to room. Tele monitoring and skin assessment completed. Family member at bedside, abuse questions not asked. Will pass along to morning RN.  Call light within reach.

## 2016-06-17 NOTE — ED Notes (Signed)
Dr. Regenia Skeeter explained tests results and admission plan to pt.

## 2016-06-17 NOTE — Consult Note (Signed)
Cardiology Consultation:   Patient ID: Katelyn [name]; [id number]; 1975-06-25   Admit date: 06/16/2016 Date of Consult: 06/17/2016  Primary Care Provider: Nicolette Bang, DO Primary Cardiologist: Johnsie Cancel (2012)  Primary Electrophysiologist:     Patient Profile:   Katelyn [name] is a 41 y.o. female with a hx of HTN, noncompliance , obesity  who is being seen today for the evaluation of elevated troponins  at the request of Dr. Ree Kida. Marland Kitchen  History of Present Illness:   Katelyn [name] is a 41 yo female Hx of HTN - ran out of her meds a week ago  Presented with frontal headaches + smoker  Has had dyspnea For the past week or so. She describes exertional shortness of breath with some very mild chest discomfort. These symptoms did not start until after she did run out of her medications. She is currently not working.  She admits to eating lots of salty food. She eats fast foods on a regular basis. She does not get any exercise.   Past Medical History:  Diagnosis Date  . Anemia   . Anxiety   . Chest pain   . Depression   . Hypertension   . Migraine   . Palpitations     Past Surgical History:  Procedure Laterality Date  . HYSTEROSCOPY W/ ENDOMETRIAL ABLATION  2010  . LAPAROSCOPY N/A 11/25/2014   Procedure: LAPAROSCOPY OPERATIVE WITH LEFT OVARIAN CYSTECTOMY;  Surgeon: Osborne Oman, MD;  Location: [facility];  Service: Gynecology;  Laterality: N/A;  . TUBAL LIGATION  2006     Inpatient Medications: Scheduled Meds: . amLODipine  10 mg Oral Daily  . carvedilol  6.25 mg Oral BID WC  . enoxaparin (LOVENOX) injection  40 mg Subcutaneous Daily  . lisinopril  20 mg Oral Daily   And  . hydrochlorothiazide  12.5 mg Oral Daily  . sodium chloride flush  3 mL Intravenous Q12H  . spironolactone  25 mg Oral Daily   Continuous Infusions:  PRN Meds: acetaminophen **OR** acetaminophen, hydrALAZINE, ondansetron (ZOFRAN) IV  Allergies:    Allergies  Allergen  Reactions  . Metoprolol Nausea Only    Makes her feel very sick when taking med    Social History:   Social History   Social History  . Marital status: Single    Spouse name: N/A  . Number of children: N/A  . Years of education: N/A   Occupational History  . Not on file.   Social History Main Topics  . Smoking status: Current Every Day Smoker    Packs/day: 0.50    Types: Cigarettes    Start date: 01/06/1994  . Smokeless tobacco: Never Used     Comment: smoking 2 cigs daily; trying to quit  . 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 Drug use: No  . Sexual activity: Not on file   Other Topics Concern  . Not on file   Social History Narrative  . No narrative on file    Family History:   The patient's family history includes Depression in her mother; Diabetes in her mother; Hypertension in her brother, brother, father, and mother.  ROS:  Please see the history of present illness.  ROS  All other ROS reviewed and negative.     Physical Exam/Data:   Vitals:   06/17/16 0342 06/17/16 0613 06/17/16 0932 06/17/16 1320  BP: (!) 174/102 115/78 (!) 147/87 116/73  Pulse: 73  81  Resp: 18   18  Temp: 98.2 F (36.8 C)   98.8 F (37.1 C)  TempSrc: Oral   Oral  SpO2: 100%   100%  Weight: 186 lb 1.6 oz (84.4 kg)     Height: 5\' 9"  (1.753 m)       Intake/Output Summary (Last 24 hours) at 06/17/16 1325 Last data filed at 06/17/16 0800  Gross per 24 hour  Intake              240 ml  Output                0 ml  Net              240 ml   Filed Weights   06/17/16 0342  Weight: 186 lb 1.6 oz (84.4 kg)   Body mass index is 27.48 kg/m.  General:  Young female , Well nourished, well developed, in no acute distress HEENT: normal Lymph: no adenopathy Neck: no JVD Endocrine:  No thryomegaly Vascular: No carotid bruits; FA pulses 2+ bilaterally without bruits  Cardiac:  normal S1, S2; RRR; 2/6 systolic murmur  Lungs:  clear to auscultation  bilaterally, no wheezing, rhonchi or rales  Abd: soft, nontender, no hepatomegaly  Ext: no edema Musculoskeletal:  No deformities, BUE and BLE strength normal and equal Skin: warm and dry  Neuro:  CNs 2-12 intact, no focal abnormalities noted Psych:  Normal affect    EKG:  The EKG was personally reviewed and demonstrates  NSR at 74. LVH with strain .   No acute changes   Relevant CV Studies:   Laboratory Data:  Chemistry Recent Labs Lab 06/16/16 2315 06/17/16 0124 06/17/16 0555  NA 136  --  136  K 3.6  --  3.4*  CL 103  --  104  CO2 25  --  21*  GLUCOSE 131*  --  105*  BUN 13  --  14  CREATININE 1.15* 1.12* 1.16*  CALCIUM 9.4  --  9.0  GFRNONAA 58* 60* 57*  GFRAA >60 >60 >60  ANIONGAP 8  --  11     Recent Labs Lab 06/16/16 2315  PROT 7.5  ALBUMIN 4.4  AST 26  ALT 28  ALKPHOS 73  BILITOT 0.6   Hematology Recent Labs Lab 06/16/16 2315 06/17/16 0124 06/17/16 0555  WBC 13.4* 12.0* 11.7*  RBC 5.07 4.79 4.54  HGB 15.7* 14.9 14.0  HCT 44.2 41.6 39.5  MCV 87.2 86.8 87.0  MCH 31.0 31.1 30.8  MCHC 35.5 35.8 35.4  RDW 12.5 12.6 12.6  PLT 173 166 178   Cardiac Enzymes Recent Labs Lab 06/16/16 2315 06/17/16 0124 06/17/16 0555 06/17/16 1156  TROPONINI 0.06* 0.05* 0.05* 0.05*   No results for input(s): TROPIPOC in the last 168 hours.  BNPNo results for input(s): BNP, PROBNP in the last 168 hours.  DDimer No results for input(s): DDIMER in the last 168 hours.  Radiology/Studies:  Dg Chest 2 View  Result Date: 06/16/2016 CLINICAL DATA:  Acute onset of shortness of breath. High blood pressure. Subacute onset of headache. Initial encounter. EXAM: CHEST  2 VIEW COMPARISON:  Chest radiograph performed 03/01/2015 FINDINGS: The lungs are well-aerated and clear. There is no evidence of focal opacification, pleural effusion or pneumothorax. The heart is borderline normal in size. No acute osseous abnormalities are seen. IMPRESSION: No acute cardiopulmonary process  seen. Electronically Signed   By: Garald Balding M.D.   On: 06/16/2016 23:48   Ct  Head Wo Contrast  Result Date: 06/17/2016 CLINICAL DATA:  Acute onset of right temporal headache, nausea and vomiting. Acute hypertensive emergency. Initial encounter. EXAM: CT HEAD WITHOUT CONTRAST TECHNIQUE: Contiguous axial images were obtained from the base of the skull through the vertex without intravenous contrast. COMPARISON:  CT of the head performed 06/11/2013 FINDINGS: Brain: No evidence of acute infarction, hemorrhage, hydrocephalus, extra-axial collection or mass lesion/mass effect. Scattered periventricular white matter change likely reflects small vessel ischemic microangiopathy. The brainstem and fourth ventricle are within normal limits. The basal ganglia are unremarkable in appearance. The cerebral hemispheres demonstrate grossly normal gray-white differentiation. No mass effect or midline shift is seen. Vascular: No hyperdense vessel or unexpected calcification. Skull: There is no evidence of fracture; visualized osseous structures are unremarkable in appearance. Sinuses/Orbits: The orbits are within normal limits. The paranasal sinuses and mastoid air cells are well-aerated. Other: No significant soft tissue abnormalities are seen. IMPRESSION: 1. No acute intracranial pathology seen on CT. 2. Mild small vessel ischemic microangiopathy. Electronically Signed   By: Garald Balding M.D.   On: 06/17/2016 02:42   US Renal  Result Date: 06/17/2016 CLINICAL DATA:  Hypertensive patient EXAM: RENAL / URINARY TRACT ULTRASOUND COMPLETE COMPARISON:  11/25/2014 FINDINGS: Right Kidney: Length: 11.8 cm. Echogenicity within normal limits. No mass or hydronephrosis visualized. Left Kidney: Length: 10.2 cm. Echogenicity within normal limits. No mass or hydronephrosis visualized. Bladder: Appears normal for degree of bladder distention. IMPRESSION: Negative renal ultrasound Electronically Signed   By: Donavan Foil M.D.   On:  06/17/2016 02:57    Assessment and Plan:   1.   Hypertensive emergency: Katelyn [name]  presents with a hypertensive emergency secondary to stopping her blood pressure medications. She continues to smoke. She eats a very high salt diet. Her medications have already been restarted and her blood pressure is significant improved. In fact it is a little lower than what the family practice team had pain before.  Her chest pain and shortness of breath have all improved. She'll need to resume her home medications and follow-up with her primary medical doctor.  I've advised her to work much harder on a better diet. I've advised her to get back to work and to get some regular exercise.  2. Elevated troponin levels: The troponin trend is flat and is only minimally elevated. This is not consistent with an acute coronary syndrome. It is more consistent with a strain pattern and demand ischemia. I do not think that she'll need any additional ischemic workup. Echocardiogram has been ordered.    Signed, Mertie Moores, MD  06/17/2016 1:25 PM

## 2016-06-17 NOTE — ED Notes (Signed)
Dr. Regenia Skeeter notified on pt.'s elevated Troponin  Level .

## 2016-06-17 NOTE — ED Notes (Signed)
Patient transported to CT scan . 

## 2016-06-17 NOTE — ED Notes (Signed)
Attempted to call report

## 2016-06-18 ENCOUNTER — Ambulatory Visit: Payer: PRIVATE HEALTH INSURANCE | Admitting: Family Medicine

## 2016-06-18 ENCOUNTER — Observation Stay (HOSPITAL_BASED_OUTPATIENT_CLINIC_OR_DEPARTMENT_OTHER): Payer: Medicaid Other

## 2016-06-18 DIAGNOSIS — I160 Hypertensive urgency: Secondary | ICD-10-CM

## 2016-06-18 DIAGNOSIS — I10 Essential (primary) hypertension: Secondary | ICD-10-CM

## 2016-06-18 DIAGNOSIS — E785 Hyperlipidemia, unspecified: Secondary | ICD-10-CM

## 2016-06-18 LAB — ECHOCARDIOGRAM COMPLETE
Height: 69 in
WEIGHTICAEL: 2977.6 [oz_av]

## 2016-06-18 LAB — HEMOGLOBIN A1C
Hgb A1c MFr Bld: 5.3 % (ref 4.8–5.6)
Mean Plasma Glucose: 105 mg/dL

## 2016-06-18 MED ORDER — CARVEDILOL 6.25 MG PO TABS: 6.2500 mg | ORAL_TABLET | Freq: Two times a day (BID) | ORAL | 0 refills | Status: DC

## 2016-06-18 MED ORDER — LISINOPRIL-HYDROCHLOROTHIAZIDE 20-12.5 MG PO TABS: 1.0000 | ORAL_TABLET | Freq: Every day | ORAL | 0 refills | Status: DC

## 2016-06-18 MED ORDER — SPIRONOLACTONE 25 MG PO TABS: 25.0000 mg | ORAL_TABLET | Freq: Every day | ORAL | 0 refills | Status: DC

## 2016-06-18 MED ORDER — AMLODIPINE BESYLATE 10 MG PO TABS: 10.0000 mg | ORAL_TABLET | Freq: Every day | ORAL | 0 refills | Status: DC

## 2016-06-18 MED ORDER — ATORVASTATIN CALCIUM 40 MG PO TABS: 40.0000 mg | ORAL_TABLET | Freq: Every day | ORAL | Status: DC

## 2016-06-18 NOTE — Progress Notes (Signed)
  Echocardiogram 2D Echocardiogram has been performed.  Katelyn [name] T Katelyn [name] 06/18/2016, 2:28 PM

## 2016-06-18 NOTE — Progress Notes (Signed)
Patient taken off the unit to the main entrance by this RN

## 2016-06-18 NOTE — Discharge Summary (Signed)
[facility] Discharge Summary  Patient name: Katelyn [name] Medical record number: [id number] Date of birth: 1975-12-04 Age: 41 y.o. Gender: female Date of Admission: 06/16/2016  Date of Discharge: 06/18/2016 Admitting Physician: Lupita Dawn, MD  Primary Care Provider: Nicolette Bang, DO Consultants: Cardiology  Indication for Hospitalization: Hypertensive crisis  Discharge Diagnoses/Problem List:  Resistant Hypertension GAD Panic Disorder Tobacco abuse  Disposition: Home  Discharge Condition: Stable  Discharge Exam:  General: NAD, pleasant, able to participate in exam Cardiac: RRR, normal heart sounds, no murmurs. 2+ radial and PT pulses bilaterally Respiratory: CTAB, normal effort, No wheezes, rales or rhonchi Abdomen: soft, nontender, nondistended, no hepatic or splenomegaly, +BS Extremities: no edema or cyanosis. WWP. Skin: warm and dry, no rashes noted Neuro: alert and oriented x4, no focal deficits Psych: Normal affect and mood  Brief Hospital Course:  41 y/o female with a past medical history significant for resistant hypertension, GAD and panic attacks, and tobacco abuse admitted with accelerated HTN/hypertensive emergency associated with headaches for the past month. On admission patient BP was 234/167 patient received tow dose of labetalol in the ED with marked improvement in BP. Renal US was normal and  Head CT was negative for any intracranial process but show some microvessels ischemic angiopathy. Headaches improved with better control BP. Patient symptoms and presentation is secondary to medications non adherence with education provided by primary. Patient also with elevated troponin and systolic murmur but no chest pain, no EKG changes or other symptoms suggesting ACS. Cardiology felt troponemia was secondary to LV Strain. Echocardiogram showed an EF of 50-55% with severe concentric hypertrophy and G1DD. Patient was discharged  with clear plan for BP meds regimen and close follow up with PCP. Education was provided by pharmacy team.  Current regimen: Lisinopril-hctz 20-12.5 mg, Coreg 6.25 mg BID, Amlodipine 10 mg, Spironolactone 25 mg   Issues for Follow Up:  1. Follow up on blood pressure control. Would likely increase lisinopril vs HCTZ for next titration if needed. 2. Reinforce medication adherence 3. Smoking cessation after cessation for 7 years. Assess and assist.  4. Will need to start atorvastatin 40mg  qd given ASCVD 55yr risk 10.4% but patient is having unprotected risk. Discuss reliable birth control first.  Significant Procedures: none  Significant Labs and Imaging:   Recent Labs Lab 06/16/16 2315 06/17/16 0124 06/17/16 0555  WBC 13.4* 12.0* 11.7*  HGB 15.7* 14.9 14.0  HCT 44.2 41.6 39.5  PLT 173 166 178    Recent Labs Lab 06/16/16 2315 06/17/16 0124 06/17/16 0555  NA 136  --  136  K 3.6  --  3.4*  CL 103  --  104  CO2 25  --  21*  GLUCOSE 131*  --  105*  BUN 13  --  14  CREATININE 1.15* 1.12* 1.16*  CALCIUM 9.4  --  9.0  ALKPHOS 73  --   --   AST 26  --   --   ALT 28  --   --   ALBUMIN 4.4  --   --    Dg Chest 2 View  IMPRESSION: No acute cardiopulmonary process seen.   Ct Head Wo Contrast  IMPRESSION: 1. No acute intracranial pathology seen on CT. 2. Mild small vessel ischemic microangiopathy.   US Renal IMPRESSION: Negative renal ultrasound   Results/Tests Pending at Time of Discharge: None  Discharge Medications:  Allergies as of 06/18/2016      Reactions   Metoprolol Nausea Only  Makes her feel very sick when taking med      Medication List    TAKE these medications   amLODipine 10 MG tablet Commonly known as:  NORVASC Take 1 tablet (10 mg total) by mouth daily.   carvedilol 6.25 MG tablet Commonly known as:  COREG Take 1 tablet (6.25 mg total) by mouth 2 (two) times daily with a meal. What changed:  See the new instructions.    lisinopril-hydrochlorothiazide 20-12.5 MG tablet Commonly known as:  PRINZIDE,ZESTORETIC Take 1 tablet by mouth daily. What changed:  See the new instructions.   spironolactone 25 MG tablet Commonly known as:  ALDACTONE Take 1 tablet (25 mg total) by mouth daily.       Discharge Instructions: Please refer to Patient Instructions section of EMR for full details.  Patient was counseled important signs and symptoms that should prompt return to medical care, changes in medications, dietary instructions, activity restrictions, and follow up appointments.   Follow-Up Appointments: Follow-up Information    [name], MD. Go on 06/20/2016.   Why:  Please go to your hospital follow up appointment arrive at 3:15pm for check in. Contact information: [place] [address] [phone]           Marjie Skiff, MD 06/20/2016, 1:25 AM PGY-1, [place]

## 2016-06-18 NOTE — Discharge Instructions (Signed)
It has been a pleasure taking care of you! You were admitted due to very high blood pressures called hypertensive urgency. We have treated you with your home blood pressure medications which have controlled your blood pressure well. With that your symptoms improved to the point we think it is safe to let you go home and follow up with your primary care doctor.  There could be some changes made to your home medications during this hospitalization. Please, make sure to read the directions before you take them. The names and directions on how to take these medications are found on this discharge paper under medication section.  Please follow up at your primary care doctor's office. The address, date and time are found on the discharge paper under follow up section.

## 2016-06-18 NOTE — Progress Notes (Signed)
Family Medicine Teaching Service Daily Progress Note Intern Pager: [phone]  Patient name: Katelyn [name] Medical record number: [id number] Date of birth: 1975/07/17 Age: 41 y.o. Gender: female  Primary Care Provider: Nicolette Bang, DO Consultants: None Code Status: Full  Pt Overview and Major Events to Date:  6/12 hypertensive urgency  Assessment and Plan:  Katelyn [name] is a 40 y.o. female presenting with persistent headaches and concern for hypertensive emergency. PMH is significant for resistant hypertension, GAD and panic attacks, and tobacco abuse.    Hypertensive urgency, resolved: in the setting of medication noncompliance. BP this morning 139/99. Renal U/S neg.  - home medications restarted (lisinopril-hctz 20-12.5 mg, coreg 6.25 mg BID, amlodipine 10 mg, spironolactone 25 mg) - prn hydralazine 5 mg ordered for SBP > 180 and DBP > 100 - monitor vital signs - Would recommend outpatient sleep study, as has history of snoring  Headaches: Improved after restarting home medications, now intermittent and relieved with tylenol. No focal deficits to suggest intracranial process. With frequent use of BC powder, suspect rebound effect contributing. - Continue to monitor  Hyperlipidemia. LDL 129, not on statin at home. ASCVD 65yr risk 10.4% - Start atorvastatin 40mg  qd as outpatient after discussing reliable form of birth control.  Tobacco abuse: Smoking about 0.5 PPD. Quit for 7 years but restarted after marriage ended.  - Encouraged tobacco cessation counseling - Patient declines nicotine patch  Sudden shortness of breath, resolved: CXR clear. Could have been bronchospasm or panic attack.  - Continue to monitor  Troponinemia, stable: Suspect 2/2 demand in setting of severely elevated BP. No CP or new EKG changes. Troponins 0.05 x3  FEN/GI: heart healthy Prophylaxis: lovenox  Disposition: DC home today  Subjective:  Feels better today. Currently has HA on  R side but is relieved by tylenol. No dizziness, lightheadedness, chest pain, SOB.  Objective: Temp:  [98.1 F (36.7 C)-99.1 F (37.3 C)] 98.1 F (36.7 C) (06/13 0415) Pulse Rate:  [68-81] 68 (06/13 0415) Resp:  [18] 18 (06/13 0415) BP: (116-156)/(73-99) 139/99 (06/13 0415) SpO2:  [100 %] 100 % (06/13 0415) Physical Exam: General: Sitting up in bed, in NAD. Able to stand without issues. Cardiovascular: RRR, no murmurs Respiratory: CTAB, normal effort on room air  Abdomen: soft, nontender, nondistended, + bowel sounds Extremities: no LE edema. Warm and well perfused.  Laboratory:  Recent Labs Lab 06/16/16 2315 06/17/16 0124 06/17/16 0555  WBC 13.4* 12.0* 11.7*  HGB 15.7* 14.9 14.0  HCT 44.2 41.6 39.5  PLT 173 166 178    Recent Labs Lab 06/16/16 2315 06/17/16 0124 06/17/16 0555  NA 136  --  136  K 3.6  --  3.4*  CL 103  --  104  CO2 25  --  21*  BUN 13  --  14  CREATININE 1.15* 1.12* 1.16*  CALCIUM 9.4  --  9.0  PROT 7.5  --   --   BILITOT 0.6  --   --   ALKPHOS 73  --   --   ALT 28  --   --   AST 26  --   --   GLUCOSE 131*  --  105*   Lipid Panel     Component Value Date/Time   CHOL 199 06/17/2016 0555   TRIG 150 (H) 06/17/2016 0555   HDL 40 (L) 06/17/2016 0555   CHOLHDL 5.0 06/17/2016 0555   VLDL 30 06/17/2016 0555   LDLCALC 129 (H) 06/17/2016 0555    Imaging/Diagnostic  Tests: No results found.  Bufford Lope, DO 06/18/2016, 7:07 AM PGY-1, [place] Intern pager: [phone], text pages welcome

## 2016-06-18 NOTE — Progress Notes (Signed)
Transitions of Care Pharmacy Note  Plan:  Educated on importance of adherence to BP regimen and smoking cessation Counseled on potential addition of statin in the future --------------------------------------------- Katelyn [name] is an 41 y.o. female who presents with a chief complaint of hypertension. In anticipation of discharge, pharmacy has reviewed this patient's prior to admission medication history, as well as current inpatient medications listed per the [facility].  Current medication indications, dosing, frequency, and notable side effects reviewed with patient. patient verbalized understanding of current inpatient medication regimen and is aware that the After Visit Summary when presented, will represent the most accurate medication list at discharge.    Assessment: Understanding of regimen: good Understanding of indications: good Potential of compliance: fair Barriers to Obtaining Medications: No  Patient instructed to contact inpatient pharmacy team with further questions or concerns if needed.    Time spent preparing for discharge counseling: 10 Time spent counseling patient: 54   Thank you for allowing pharmacy to be a part of this patient's care.  Arrie Senate, PharmD PGY-1 Pharmacy Resident Pager: [phone] 06/18/2016

## 2016-06-18 NOTE — Progress Notes (Signed)
Patient in a stable condition, discharge education reviewed with patient she verbalized understanding, iv removed tele dc ccmd notified, patient belongings at bedside, patients awaiting her mother fro transportation home.

## 2016-06-18 NOTE — Progress Notes (Signed)
Progress Note  Patient Name: Katelyn [name] Date of Encounter: 06/18/2016  Primary Cardiologist:  Johnsie Cancel (2012)  Subjective   Katelyn [name] is a 41 y.o. female with a hx of HTN, noncompliance , obesity  who is being seen for the evaluation of elevated troponins.   Echocardiogram pending for today. She had a headache this morning but it is gone now. She plans to make lifestyle changes (take her medications, decrease salt intake and quit smoking). This is not the first time she has been in the hospital or admitted for her hypertension. She does not need medication assistance, she says that she just hadn't been taking the medication  Inpatient Medications    Scheduled Meds: . amLODipine  10 mg Oral Daily  . atorvastatin  40 mg Oral q1800  . carvedilol  6.25 mg Oral BID WC  . enoxaparin (LOVENOX) injection  40 mg Subcutaneous Daily  . lisinopril  20 mg Oral Daily   And  . hydrochlorothiazide  12.5 mg Oral Daily  . sodium chloride flush  3 mL Intravenous Q12H  . spironolactone  25 mg Oral Daily   Continuous Infusions:  PRN Meds: acetaminophen **OR** acetaminophen, hydrALAZINE, ibuprofen, ondansetron (ZOFRAN) IV   Vital Signs    Vitals:   06/17/16 1734 06/17/16 2036 06/18/16 0415 06/18/16 0806  BP: (!) 156/96 (!) 145/84 (!) 139/99 (!) 150/89  Pulse: 72 79 68 70  Resp:  18 18 18   Temp:  99.1 F (37.3 C) 98.1 F (36.7 C) 98.6 F (37 C)  TempSrc:  Oral Oral Oral  SpO2:  100% 100% 100%  Weight:      Height:        Intake/Output Summary (Last 24 hours) at 06/18/16 0915 Last data filed at 06/17/16 2038  Gross per 24 hour  Intake              720 ml  Output                0 ml  Net              720 ml   Filed Weights   06/17/16 0342  Weight: 186 lb 1.6 oz (84.4 kg)    Telemetry    NSR - Personally Reviewed   Physical Exam   GEN: Well nourished, well developed HEENT: normal  Neck: no JVD, carotid bruits, or masses Cardiac: RRR. no murmurs, rubs, or  gallops,no edema. Intact distal pulses bilaterally.  Respiratory: clear to auscultation bilaterally, normal work of breathing GI: soft, nontender, nondistended, + BS MS: no deformity or atrophy  Skin: warm and dry, no rash Neuro: Alert and Oriented x 3, Strength and sensation are intact Psych:   Full affect  Labs    Chemistry Recent Labs Lab 06/16/16 2315 06/17/16 0124 06/17/16 0555  NA 136  --  136  K 3.6  --  3.4*  CL 103  --  104  CO2 25  --  21*  GLUCOSE 131*  --  105*  BUN 13  --  14  CREATININE 1.15* 1.12* 1.16*  CALCIUM 9.4  --  9.0  PROT 7.5  --   --   ALBUMIN 4.4  --   --   AST 26  --   --   ALT 28  --   --   ALKPHOS 73  --   --   BILITOT 0.6  --   --   GFRNONAA 58* 60* 57*  GFRAA >60 >60 >  60  ANIONGAP 8  --  11     Hematology Recent Labs Lab 06/16/16 2315 06/17/16 0124 06/17/16 0555  WBC 13.4* 12.0* 11.7*  RBC 5.07 4.79 4.54  HGB 15.7* 14.9 14.0  HCT 44.2 41.6 39.5  MCV 87.2 86.8 87.0  MCH 31.0 31.1 30.8  MCHC 35.5 35.8 35.4  RDW 12.5 12.6 12.6  PLT 173 166 178    Cardiac Enzymes Recent Labs Lab 06/16/16 2315 06/17/16 0124 06/17/16 0555 06/17/16 1156  TROPONINI 0.06* 0.05* 0.05* 0.05*   No results for input(s): TROPIPOC in the last 168 hours.   BNPNo results for input(s): BNP, PROBNP in the last 168 hours.   DDimer No results for input(s): DDIMER in the last 168 hours.   Radiology    Dg Chest 2 View  Result Date: 06/16/2016 CLINICAL DATA:  Acute onset of shortness of breath. High blood pressure. Subacute onset of headache. Initial encounter. EXAM: CHEST  2 VIEW COMPARISON:  Chest radiograph performed 03/01/2015 FINDINGS: The lungs are well-aerated and clear. There is no evidence of focal opacification, pleural effusion or pneumothorax. The heart is borderline normal in size. No acute osseous abnormalities are seen. IMPRESSION: No acute cardiopulmonary process seen. Electronically Signed   By: Garald Balding M.D.   On: 06/16/2016 23:48    Ct Head Wo Contrast  Result Date: 06/17/2016 CLINICAL DATA:  Acute onset of right temporal headache, nausea and vomiting. Acute hypertensive emergency. Initial encounter. EXAM: CT HEAD WITHOUT CONTRAST TECHNIQUE: Contiguous axial images were obtained from the base of the skull through the vertex without intravenous contrast. COMPARISON:  CT of the head performed 06/11/2013 FINDINGS: Brain: No evidence of acute infarction, hemorrhage, hydrocephalus, extra-axial collection or mass lesion/mass effect. Scattered periventricular white matter change likely reflects small vessel ischemic microangiopathy. The brainstem and fourth ventricle are within normal limits. The basal ganglia are unremarkable in appearance. The cerebral hemispheres demonstrate grossly normal gray-white differentiation. No mass effect or midline shift is seen. Vascular: No hyperdense vessel or unexpected calcification. Skull: There is no evidence of fracture; visualized osseous structures are unremarkable in appearance. Sinuses/Orbits: The orbits are within normal limits. The paranasal sinuses and mastoid air cells are well-aerated. Other: No significant soft tissue abnormalities are seen. IMPRESSION: 1. No acute intracranial pathology seen on CT. 2. Mild small vessel ischemic microangiopathy. Electronically Signed   By: Garald Balding M.D.   On: 06/17/2016 02:42   US Renal  Result Date: 06/17/2016 CLINICAL DATA:  Hypertensive patient EXAM: RENAL / URINARY TRACT ULTRASOUND COMPLETE COMPARISON:  11/25/2014 FINDINGS: Right Kidney: Length: 11.8 cm. Echogenicity within normal limits. No mass or hydronephrosis visualized. Left Kidney: Length: 10.2 cm. Echogenicity within normal limits. No mass or hydronephrosis visualized. Bladder: Appears normal for degree of bladder distention. IMPRESSION: Negative renal ultrasound Electronically Signed   By: Donavan Foil M.D.   On: 06/17/2016 02:57    Cardiac Studies   Echocardiogram pending this  admission.  Patient Profile   Katelyn [name] is a 41 yo female Hx of HTN - ran out of her meds a week ago  Presented with frontal headaches + smoker and admitted for elevated troponins.  Assessment & Plan    Blood Pressure: 150/89            Hr: 70s  1. Hypertensive emergency: BP has remained controlled overnight. Still no SOB or CP. Will discuss increasing Coreg dose today with Dr. Acie Fredrickson.  2. Elevated troponin levels: Troponin levels are flat and mild elevation is felt to  be due to strain pattern and demand ischemia.   Echocardiogram ordered yesterday and pending, further recommendations pending results.    Kristopher Glee, PA-C  06/18/2016, 9:15 AM     Attending Note:   The patient was seen and examined.  Agree with assessment and plan as noted above.  Changes made to the above note as needed.  Patient seen and independently examined with Delos Haring, PA .   We discussed all aspects of the encounter. I agree with the assessment and plan as stated above.  1.  Hypertensive emergency:   Due to medication noncompliance  BP is better  She follows with her medical doctor  The minimal troponin  Bump was almost certainly due to LV strain  No symptoms to suggest ACS   I have spent a total of 30 minutes with patient reviewing hospital  notes , telemetry, EKGs, labs and examining patient as well as establishing an assessment and plan that was discussed with the patient. > 50% of time was spent in direct patient care.  Will sign off.   Thayer Headings, Brooke Bonito., MD, [facility] 06/18/2016, 11:43 AM [address],  [place] Pager [phone]

## 2016-06-20 ENCOUNTER — Encounter: Payer: Self-pay | Admitting: Student in an Organized Health Care Education/Training Program

## 2016-06-20 ENCOUNTER — Ambulatory Visit (INDEPENDENT_AMBULATORY_CARE_PROVIDER_SITE_OTHER): Payer: Self-pay | Admitting: Student in an Organized Health Care Education/Training Program

## 2016-06-20 DIAGNOSIS — E785 Hyperlipidemia, unspecified: Secondary | ICD-10-CM

## 2016-06-20 DIAGNOSIS — I10 Essential (primary) hypertension: Secondary | ICD-10-CM

## 2016-06-20 DIAGNOSIS — F17200 Nicotine dependence, unspecified, uncomplicated: Secondary | ICD-10-CM

## 2016-06-20 MED ORDER — ATORVASTATIN CALCIUM 40 MG PO TABS: 40.0000 mg | ORAL_TABLET | Freq: Every day | ORAL | 3 refills | Status: DC

## 2016-06-20 MED ORDER — LISINOPRIL-HYDROCHLOROTHIAZIDE 20-12.5 MG PO TABS: 2.0000 | ORAL_TABLET | Freq: Every day | ORAL | Status: DC

## 2016-06-20 NOTE — Assessment & Plan Note (Signed)
-   start atorvastatin 40 mg qd

## 2016-06-20 NOTE — Patient Instructions (Addendum)
It was a pleasure seeing you today in our clinic. Today we discussed your blood pressure. Here is the treatment plan we have discussed and agreed upon together:  -  Please schedule an appointment on your way out to meet with Dr. Valentina Lucks for smoking cessation counseling  -  Please increase the dose of your lisinopril-HCTZ.  Please take two of these pills each day. - We will NOT do the Norvasc or Spironolactone that you were unable to pick up. Do not take these medications. - I sent a new medication for your cholesterol called Atorvastatin, please take this once daily.  Please come in for your follow up appointment on Monday, 06/23/2016.  Our clinic's number is [phone]. Please call with questions or concerns about what we discussed today.  Be well, Dr. Burr Medico

## 2016-06-20 NOTE — Assessment & Plan Note (Addendum)
BP elevated at 180/110 on recheck today, no severe symptoms. - discontinue norvasc and spiro because patient was unable to fill them - titrate HCTZ-Ace to 25mg -40mg  daily - continue coreg - close follow up on Monday - tobacco cessation with Dr. Valentina Lucks scheduled - Atorvastatin 40 mg started given ASCVD 34% (concern at discharge regarding starting statin in reproductive age patient- however patient reports history of BTL) - strict return precautions discussed - case precepted with Dr. Nori Riis

## 2016-06-20 NOTE — Progress Notes (Cosign Needed)
  HPI:  Katelyn [name] presents for hospital follow up. Patient was hospitalized from 6/11 to 6/13 with hypertensive emergency.   She was discharged on the following regimen: Lisinopril-hctz 20-12.5 mg, Coreg 6.25 mg BID, Amlodipine 10 mg, Spironolactone 25 mg   Patient now reports she was unable to pick up the amlodipine  due to costing $24,  and she was unable to get spironolactone because it was back-ordered at her pharmacy. She reports that she has taken the lisinopril-HCTZ and the Coreg every day since leaving the hospital without difficulty.  She reports she had headaches yesterday evening but her headaches have resolved and she has not had blurry vision.  ROS: See HPI.  [place]: reviewedDictation #1 [id number]  [id number]   PHYSICAL EXAM: BP (!) 180/110   Pulse 68   Temp 99.2 F (37.3 C) (Oral)   Wt 186 lb (84.4 kg)   SpO2 99%   BMI 27.47 kg/m  Gen: NAD, comfortable HEENT: PERRL, EOMI Heart: RRR, no m/r/g Lungs: CTA bil, no W/R/R Neuro: CN II-XII grossly intact Ext: normal gait, no edema  ASSESSMENT/PLAN:  Severe hypertension BP elevated at 180/110 on recheck today, no severe symptoms. - discontinue norvasc and spiro because patient was unable to fill them - titrate HCTZ-Ace to 25mg -40mg  daily - continue coreg - close follow up on Monday - tobacco cessation with Dr. Valentina Lucks scheduled - Atorvastatin 40 mg started given ASCVD 34% (concern at discharge regarding starting statin in reproductive age patient- however patient reports history of BTL) - strict return precautions discussed - case precepted with Dr. Nori Riis  Hyperlipidemia - start atorvastatin 40 mg qd  [employer] - patient to schedule appt with Dr. Valentina Lucks for tobacco cessation counseling  FOLLOW UP: Follow up in 3 days for blood pressure check and continued antihypertensive titration.  [name], MD, [place] PGY1 [facility]

## 2016-06-20 NOTE — Assessment & Plan Note (Signed)
-   patient to schedule appt with Dr. Valentina Lucks for tobacco cessation counseling

## 2016-06-23 ENCOUNTER — Telehealth: Payer: Self-pay | Admitting: Internal Medicine

## 2016-06-23 ENCOUNTER — Ambulatory Visit: Payer: Self-pay | Admitting: Internal Medicine

## 2016-06-23 NOTE — Telephone Encounter (Signed)
Pt called because she was prescribed Lipitor. She can not afford this at all. Can we call in something that would be cheaper. Please let her know what she do . jw

## 2016-06-24 NOTE — Telephone Encounter (Signed)
Patient needs to come in for an appointment to discuss her cholesterol medication. She also no-showed her hospital follow up and needs to be seen for her high blood pressure.   Phill Myron, D.O. 06/24/2016, 5:27 PM PGY-2, [place]

## 2016-06-26 NOTE — Telephone Encounter (Signed)
Spoke to pt. Pt has an appt on 6/28. Ottis Stain, CMA

## 2016-07-01 ENCOUNTER — Ambulatory Visit (INDEPENDENT_AMBULATORY_CARE_PROVIDER_SITE_OTHER): Payer: Self-pay | Admitting: Pharmacist

## 2016-07-01 ENCOUNTER — Encounter: Payer: Self-pay | Admitting: Pharmacist

## 2016-07-01 DIAGNOSIS — E785 Hyperlipidemia, unspecified: Secondary | ICD-10-CM

## 2016-07-01 DIAGNOSIS — I10 Essential (primary) hypertension: Secondary | ICD-10-CM

## 2016-07-01 DIAGNOSIS — F17200 Nicotine dependence, unspecified, uncomplicated: Secondary | ICD-10-CM

## 2016-07-01 MED ORDER — ATORVASTATIN CALCIUM 40 MG PO TABS: 40.0000 mg | ORAL_TABLET | Freq: Every day | ORAL | 3 refills | Status: DC

## 2016-07-01 MED ORDER — NICOTINE POLACRILEX 2 MG MT LOZG: 2.0000 mg | LOZENGE | OROMUCOSAL | 0 refills | Status: DC | PRN

## 2016-07-01 MED ORDER — LISINOPRIL-HYDROCHLOROTHIAZIDE 20-12.5 MG PO TABS: 2.0000 | ORAL_TABLET | Freq: Every day | ORAL | Status: DC

## 2016-07-01 MED ORDER — SPIRONOLACTONE 25 MG PO TABS: 25.0000 mg | ORAL_TABLET | Freq: Every day | ORAL | 3 refills | Status: DC

## 2016-07-01 NOTE — Assessment & Plan Note (Signed)
ASCVD risk reduction: calculated ASCVD risk 23% -Restart atorvastatin 40mg  once daily (Child Birth Risk minimal - hx tubal ligation) -counseled about getting atorvastatin at [facility] with GoodRx coupon for ~$10

## 2016-07-01 NOTE — Assessment & Plan Note (Signed)
Tobacco use: smoked 2 cigarettes today and 0 yesterday, would like to quit today (1 cigarette left) -Sent prescriptions for nicotine patch 7 mg/day as possible option if lozenges 2mg  is too expensive.  -counseled to call 1800 QUITLINE to receive free patches/lozenges - provided written tobacco cessation materials.

## 2016-07-01 NOTE — Assessment & Plan Note (Signed)
Hypertension longstanding diagnosed since 41 YO currently uncontrolled with BP 164/104 on current medications. -Continue all BP medications as above. Refilled lisinopril-HCTZ today. -Start spironolactone 25mg  once daily -counseled about getting spironolactone at [facility] ([facility] with GoodRx coupon for ~$5 -Consider amlodipine if uncontrolled BP at next visit -Next appointment with Dr. Juleen China on 07/03/16 - will reassess BP and obtain BMET

## 2016-07-01 NOTE — Patient Instructions (Addendum)
Thanks for coming to see Katelyn [name] today! Here are the medication changes we discussed today:  - continue all your blood pressure medications - Start spironolactone 25 mg once daily for your blood pressure (~$5 at [facility] with GoodRx coupon on your phone) - Start atorvastatin 40 mg once daily for your cholesterol (~$10 at [facility] with GoodRx coupon on your phone) - Sent your nicotine patch 7mg  a day to [facility]. Please call 1800-QUITLINE to see if you can get free nicotine patches and lozenges through that phone line. - Buy nicotine lozenges 2 mg over-the-counter at your local pharmacy - Please schedule a meeting with [name] for [facility]  We will recheck your blood pressure and get some bloodwork on Thursday when you are here to see Dr. Juleen China.

## 2016-07-01 NOTE — Progress Notes (Signed)
   S:    Patient arrives ambulating without assitance. Patient was recently discharged from hospital on 06/16/16 (hypertensive emergency) and all medications have been reviewed. Presents to the clinic for hypertension evaluation. Patient was referred on 06/20/16. Patient was last seen by Primary Care Provider on 06/20/16 with Dr. Burr Medico.   Patient denies adherence with atorvastatin due to cost.  Patient denies migraines since starting BP medications. Reports adherence with both carvedilol and lisinopril/HCTZ.  Requests refill of Lisinopril/HCTZ.   Family history: HTN diagnosed at 41 YO, extensive family history (mother, father, 2 brothers) Social history: currently unemployed, no insurance Tobacco history: chronic user since 22-22 YO (brand: Maverick), quit cold [place] for 7 years but relapsed in 2009 -Started cutting back when recently hospitalized (went from 1 PPD to 3 cigarettes/day) -Triggers: stress, alcohol intake, smokes first thing in the morning w/ coffee -Motivation: children, mom, BP control, tired of smell of cigarettes  Current BP Medications include:  Lisinopril-HCTZ 40-25mg  once daily, carvedilol 6.25 mg BID  Antihypertensives tried in the past include: metoprolol (GI upset), amlodipine (unable to afford), spironolactone  O:   Last 3 Office BP readings: BP Readings from Last 3 Encounters:  07/01/16 (!) 164/104  06/20/16 (!) 180/110  06/18/16 (!) 154/97   BMET    Component Value Date/Time   NA 136 06/17/2016 0555   K 3.4 (L) 06/17/2016 0555   CL 104 06/17/2016 0555   CO2 21 (L) 06/17/2016 0555   GLUCOSE 105 (H) 06/17/2016 0555   BUN 14 06/17/2016 0555   CREATININE 1.16 (H) 06/17/2016 0555   CREATININE 1.17 (H) 01/12/2015 1631   CALCIUM 9.0 06/17/2016 0555   GFRNONAA 57 (L) 06/17/2016 0555   GFRNONAA 58 (L) 01/12/2015 1631   GFRAA >60 06/17/2016 0555   GFRAA 67 01/12/2015 1631   A/P: Hypertension longstanding diagnosed since 41 YO currently uncontrolled with BP  164/104 on current medications. -Continue all BP medications as above. Refilled lisinopril-HCTZ today. -Start spironolactone 25mg  once daily -counseled about getting spironolactone at [facility] ([facility]) with GoodRx coupon for ~$5 -Consider amlodipine if uncontrolled BP at next visit -Next appointment with Dr. Juleen China on 07/03/16 - will reassess BP and obtain BMET  ASCVD risk reduction: calculated ASCVD risk 23% -Restart atorvastatin 40mg  once daily (Child Birth Risk minimal - hx tubal ligation) -counseled about getting atorvastatin at [facility] with GoodRx coupon for ~$10  Tobacco use: smoked 2 cigarettes today and 0 yesterday, would like to quit today (1 cigarette left) -Sent prescriptions for nicotine patch 7 mg/day as possible option if lozenges 2mg  is too expensive.  -counseled to call [phone] QUITLINE to receive free patches/lozenges - provided written tobacco cessation materials.   Results reviewed and written information provided.  Total time in face-to-face counseling 35 minutes. Patient seen with Shelle Iron, PharmD Candidate, and Loney Hering, PharmD PGY-1 Resident.

## 2016-07-02 NOTE — Progress Notes (Signed)
Patient ID: Katelyn [name], female   DOB: [birth year], 41 y.o.   MRN: [id number] Reviewed: agree with Dr. Graylin Shiver documentation and management.

## 2016-07-03 ENCOUNTER — Encounter: Payer: Self-pay | Admitting: Internal Medicine

## 2016-07-03 ENCOUNTER — Ambulatory Visit (INDEPENDENT_AMBULATORY_CARE_PROVIDER_SITE_OTHER): Payer: Self-pay | Admitting: Internal Medicine

## 2016-07-03 DIAGNOSIS — I10 Essential (primary) hypertension: Secondary | ICD-10-CM

## 2016-07-03 MED ORDER — LISINOPRIL-HYDROCHLOROTHIAZIDE 20-12.5 MG PO TABS: 2.0000 | ORAL_TABLET | Freq: Every day | ORAL | Status: DC

## 2016-07-03 MED ORDER — LISINOPRIL-HYDROCHLOROTHIAZIDE 20-12.5 MG PO TABS: 2.0000 | ORAL_TABLET | Freq: Every day | ORAL | 3 refills | Status: DC

## 2016-07-03 NOTE — Patient Instructions (Signed)
Your blood pressure looks much better today. We will call you with your lab results. Please return for a nurse visit for a blood pressure check in 1-2 weeks. I have sent your Lisinopril to the pharmacy.

## 2016-07-03 NOTE — Assessment & Plan Note (Signed)
Longstanding h/o HTN. BP with significantly better control than at recent visits with addition of Spironolactone to regimen and overall adherence to medications.  -continue all medications; refilled Lisinopril-HCTZ today  -BMET to check electrolytes with addition of Spironolactone  -return in 1-2 weeks for RN BP check

## 2016-07-03 NOTE — Progress Notes (Signed)
   Subjective:    Katelyn [name] - 41 y.o. female MRN [id number]  Date of birth: [birth year]  HPI  Katelyn [name] is here for follow up of HTN.  HTN: Seen by pharmacy team on 6/26 for severe HTN. Patient was hospitalized earlier this month for hypertensive emergency. Spironolactone was started at this visit. Patient reports adherence to this medication without AEs. She does not monitor her BP at home as she has not been able to find her BP cuff. Denies headaches, chest pain, and vision changes.    -  reports that she has been smoking Cigarettes.  She started smoking about 22 years ago. She has a 1.30 pack-year smoking history. She has never used smokeless tobacco. - Review of Systems: Per HPI. - Past Medical History: Patient Active Problem List   Diagnosis Date Noted  . Hyperlipidemia   . Hypertensive emergency 06/17/2016  . Generalized headaches 06/16/2016  . 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 AKI (acute kidney injury) ([place]) 01/14/2015  . Breast lump in upper outer quadrant 12/28/2014  . Ovarian torsion 11/25/2014  . Snoring 06/23/2014  . Abdominal pain, RLQ 06/23/2014  . Atypical chest pain 01/30/2014  . Migraine headache without aura 07/29/2012  . Elevated LFTs 04/26/2012  . GAD (generalized anxiety disorder) 03/20/2011  . Panic disorder with agoraphobia and moderate panic attacks 02/26/2009  . CIGARETTE SMOKER 11/24/2008  . Severe hypertension 03/05/2006   - Medications: reviewed and updated   Objective:   Physical Exam BP 140/80 (BP Location: Right Arm, Patient Position: Sitting, Cuff Size: Normal)   Pulse 80   Temp 98.4 F (36.9 C) (Oral)   Ht 5\' 9"  (1.753 m)   Wt 185 lb 3.2 oz (84 kg)   SpO2 99%   BMI 27.35 kg/m  Gen: NAD, alert, cooperative with exam, well-appearing CV: RRR, good S1/S2, no murmur, no edema, capillary refill brisk  Resp: CTABL, no wheezes, non-labored     Assessment & Plan:   Severe hypertension Longstanding h/o HTN. BP with  significantly better control than at recent visits with addition of Spironolactone to regimen and overall adherence to medications.  -continue all medications; refilled Lisinopril-HCTZ today  -BMET to check electrolytes with addition of Spironolactone  -return in 1-2 weeks for RN BP check      Phill Myron, D.O. 07/03/2016, 2:57 PM PGY-2, [place]

## 2016-07-04 ENCOUNTER — Telehealth: Payer: Self-pay | Admitting: *Deleted

## 2016-07-04 LAB — BASIC METABOLIC PANEL
BUN / CREAT RATIO: 17 (ref 9–23)
BUN: 20 mg/dL (ref 6–24)
CO2: 22 mmol/L (ref 20–29)
CREATININE: 1.19 mg/dL — AB (ref 0.57–1.00)
Calcium: 10.5 mg/dL — ABNORMAL HIGH (ref 8.7–10.2)
Chloride: 96 mmol/L (ref 96–106)
GFR, EST AFRICAN AMERICAN: 65 mL/min/{1.73_m2} (ref >59)
GFR, EST NON AFRICAN AMERICAN: 56 mL/min/{1.73_m2} — AB (ref >59)
Glucose: 90 mg/dL (ref 65–99)
Potassium: 4.3 mmol/L (ref 3.5–5.2)
SODIUM: 139 mmol/L (ref 134–144)

## 2016-07-04 NOTE — Telephone Encounter (Signed)
Pt informed. Zimmerman Rumple, April D, CMA  

## 2016-07-04 NOTE — Telephone Encounter (Signed)
-----   Message from Nicolette Bang, DO sent at 07/04/2016  9:43 AM EDT ----- Please call patient to let her know electrolytes were stable with addition of Spironolactone.   Phill Myron, D.O. 07/04/2016, 9:43 AM PGY-2, [place]

## 2016-07-20 IMAGING — MG MM DIAG BREAST TOMO UNI RIGHT
6 series · 6 of 14 positions shown · non-contrast
Comparison: Previous exam(s).

CLINICAL DATA: 40-year-old female -followup palpable likely benign
feels a palpable abnormality.

EXAM:
2D DIGITAL DIAGNOSTIC RIGHT MAMMOGRAM WITH CAD AND ADJUNCT TOMO
ULTRASOUND RIGHT BREAST

[R MLO]
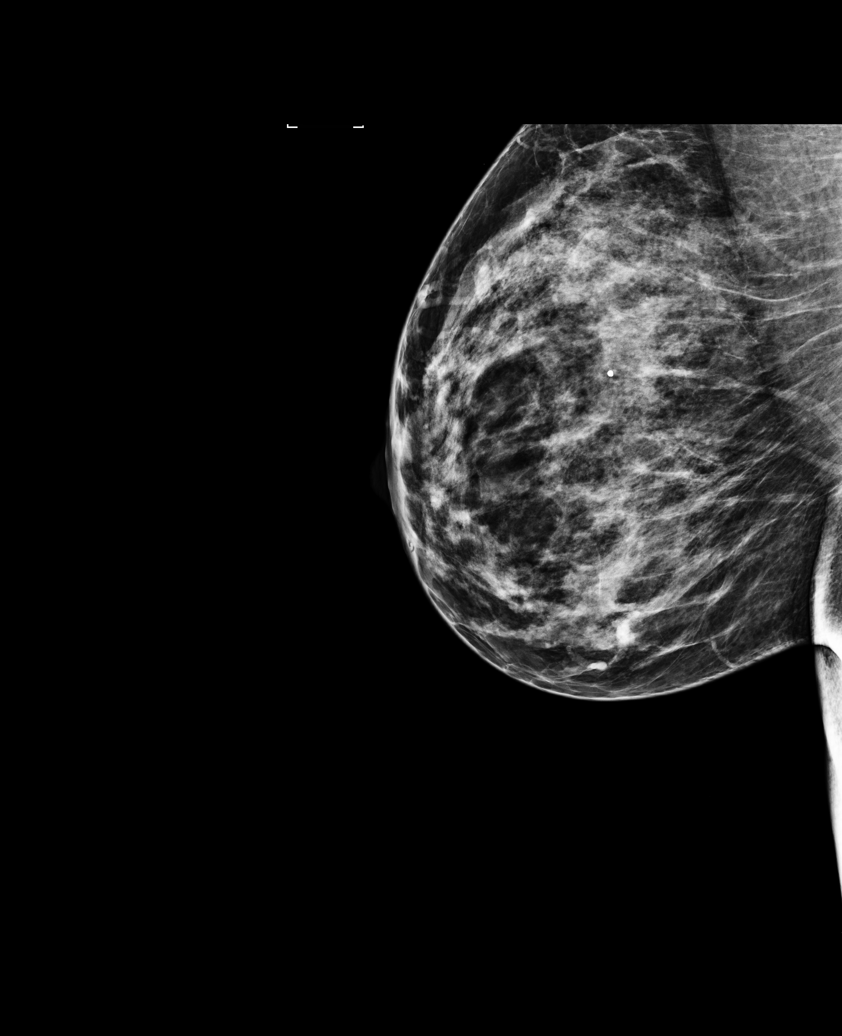

[R CC]
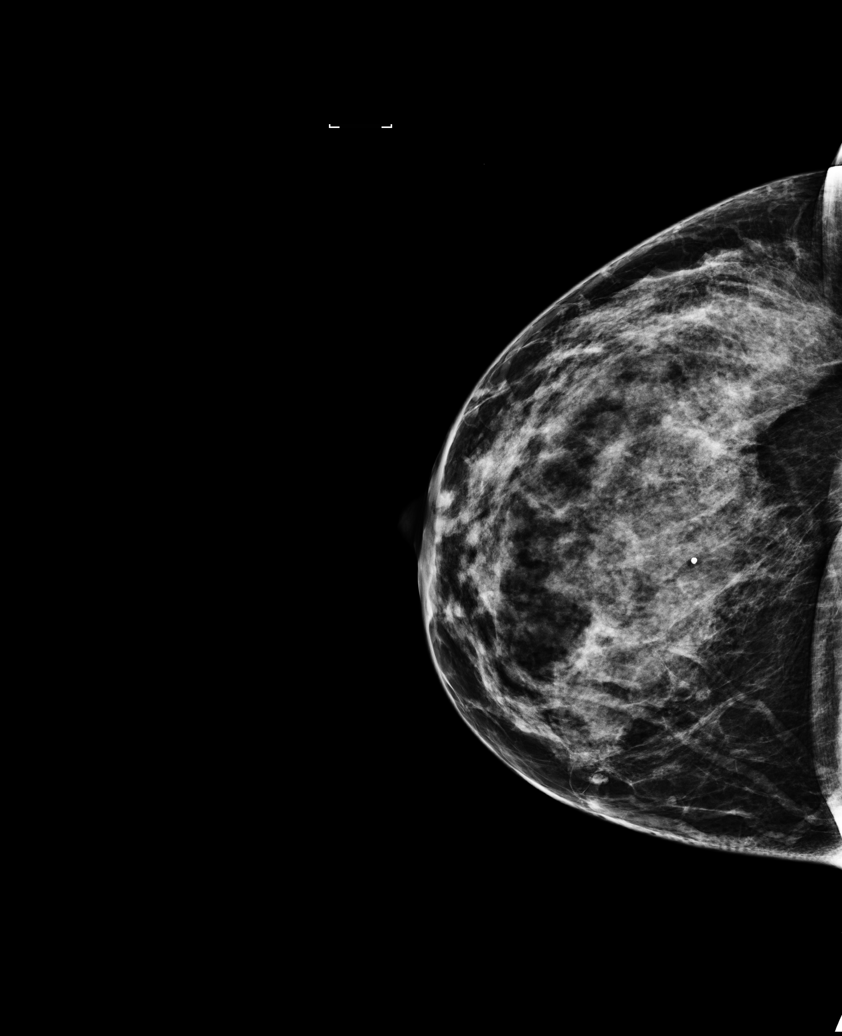

[R MLO synth-2D]
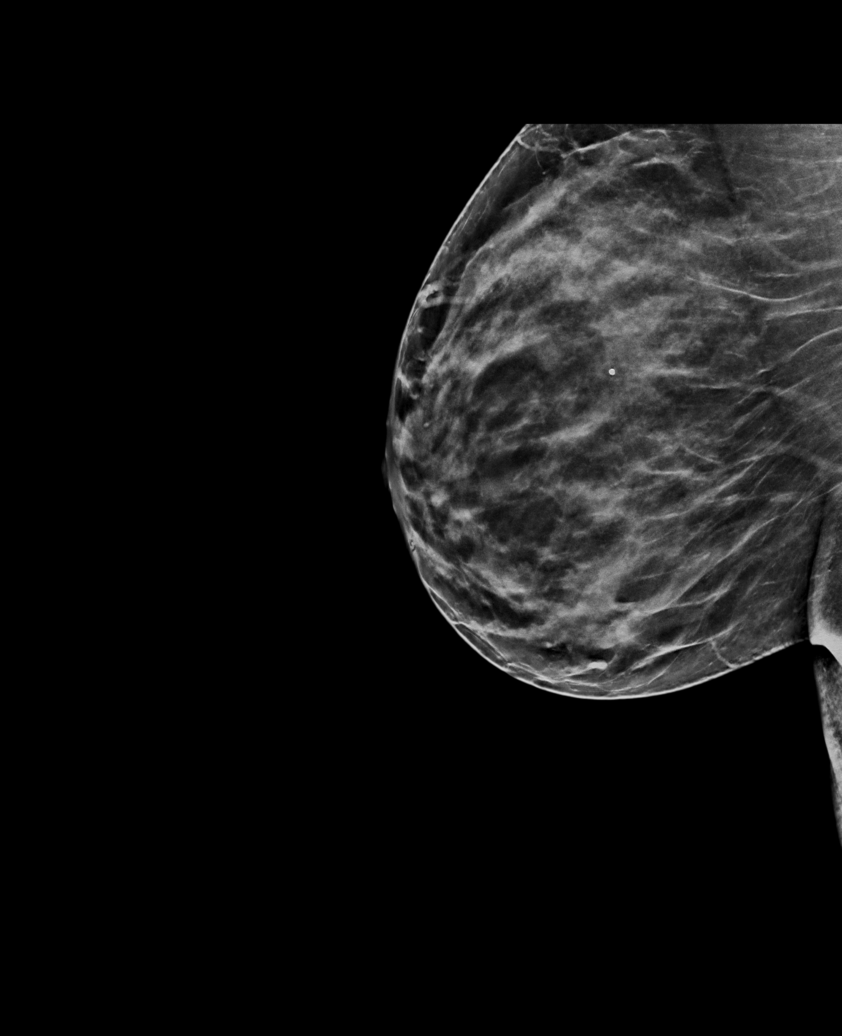

[R CC synth-2D]
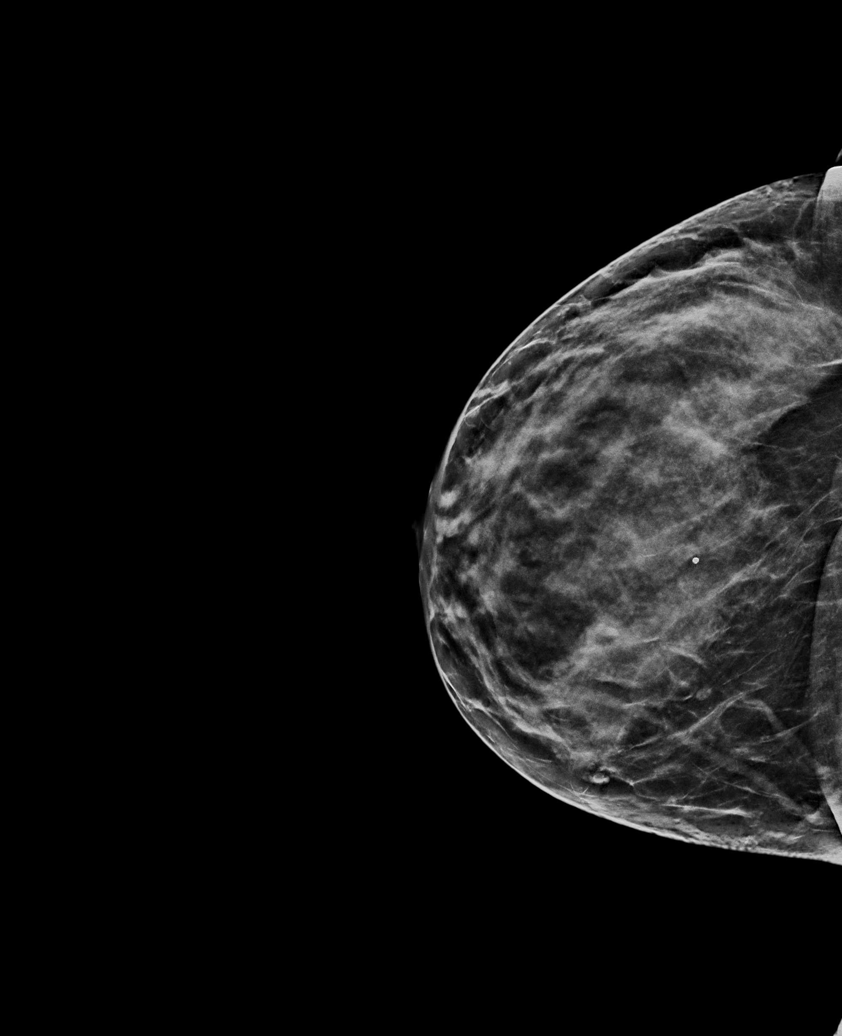

[R MLO tomo · tomo slice 33/65.0]
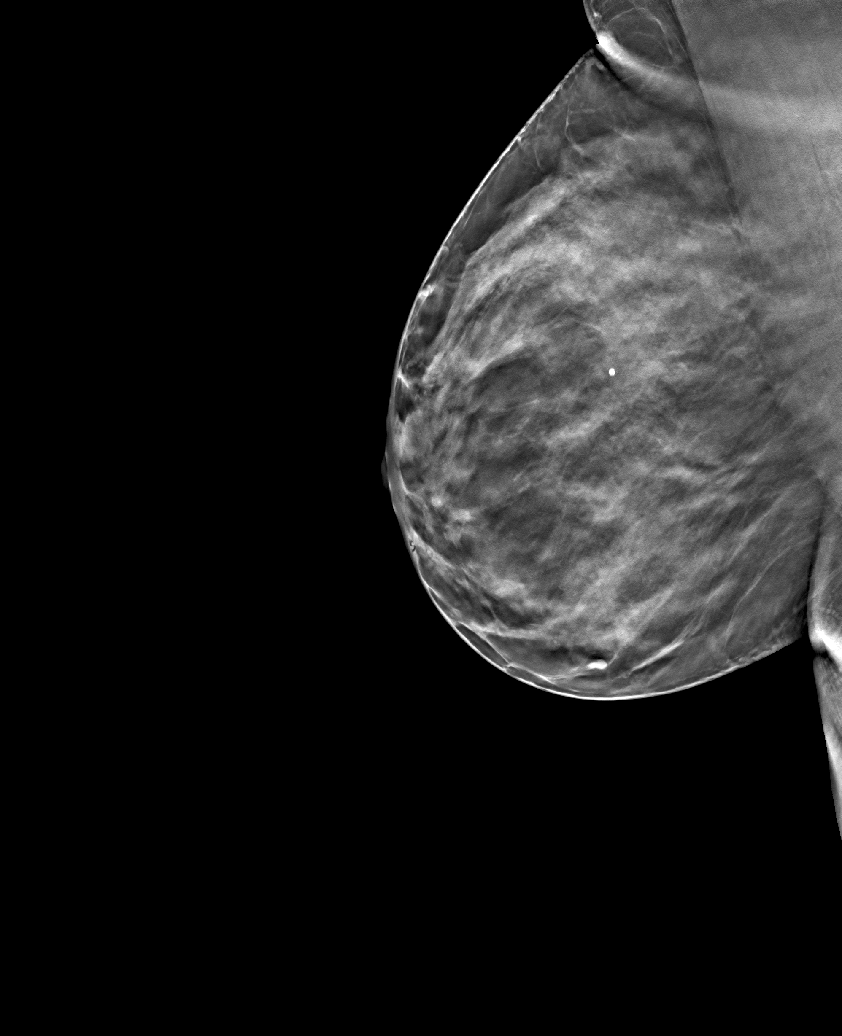

[R CC tomo · tomo slice 32/63.0]
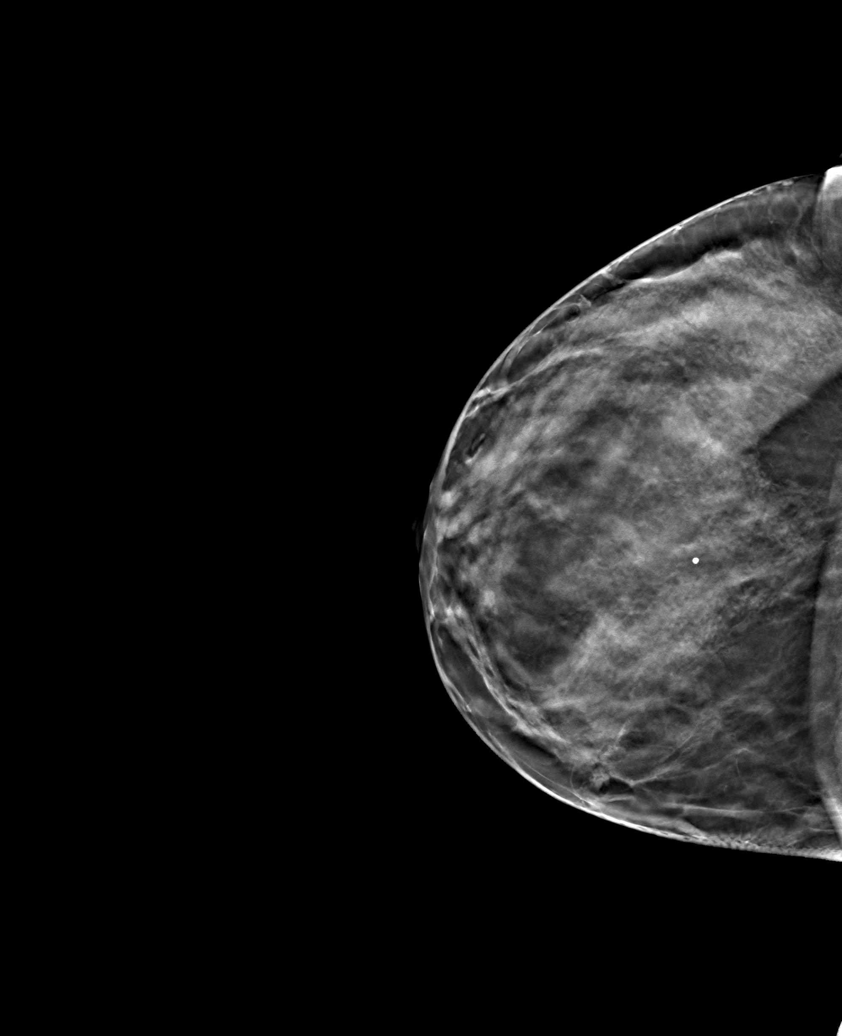

[6 of 14 positions shown; findings below may reference images not displayed]

ACR Breast Density Category c: The breast tissue is heterogeneously
dense, which may obscure small masses.
FINDINGS: 2D and 3D full field views of both breast demonstrate no suspicious
mass, distortion or worrisome calcifications.

Mammographic images were processed with CAD.

On physical exam, no palpable abnormalities are identified within
the upper right breast.

Targeted ultrasound is performed, showing no solid or cystic mass,
distortion or abnormal areas of shadowing within the upper right
breast. The area of fat necrosis identified on the previous study
has resolved.
IMPRESSION: Resolved fat necrosis changes within the upper right breast.

No mammographic evidence of right breast malignancy.

RECOMMENDATION:
Bilateral screening mammograms in 8 months to resume annual
mammogram schedule.

I have discussed the findings and recommendations with the patient.
Results were also provided in writing at the conclusion of the
visit. If applicable, a reminder letter will be sent to the patient
regarding the next appointment.

BI-RADS CATEGORY  1: Negative.

## 2017-02-02 IMAGING — US US TRANSVAGINAL NON-OB
1 series · 15 of 25 positions shown · non-contrast
Comparison: None

CLINICAL DATA: Intermittent right sided pelvic pain. Previous
endometrial ablation.

EXAM:
TRANSABDOMINAL AND TRANSVAGINAL ULTRASOUND OF PELVIS
TECHNIQUE: Both transabdominal and transvaginal ultrasound examinations of the
pelvis were performed. Transabdominal technique was performed for
global imaging of the pelvis including uterus, ovaries, adnexal
regions, and pelvic cul-de-sac. It was necessary to proceed with
endovaginal exam following the transabdominal exam to visualize the
endometrium and ovaries.

[Series 1: us transvaginal non-ob · 53 acquisitions, 15 frames shown]
[im 1/53]
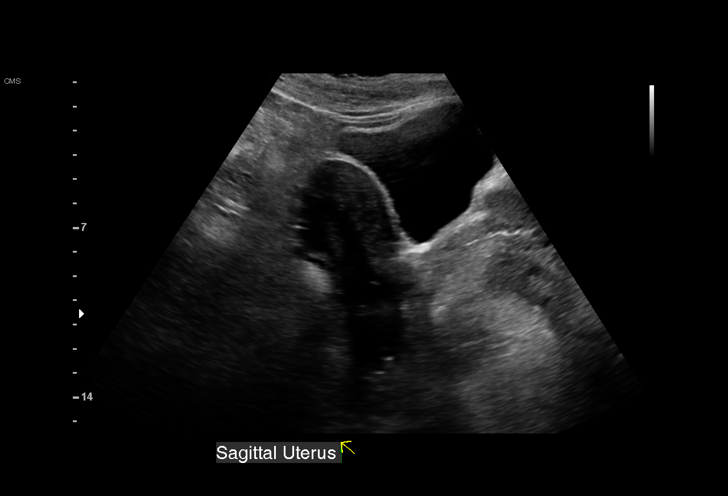
[im 5/53]
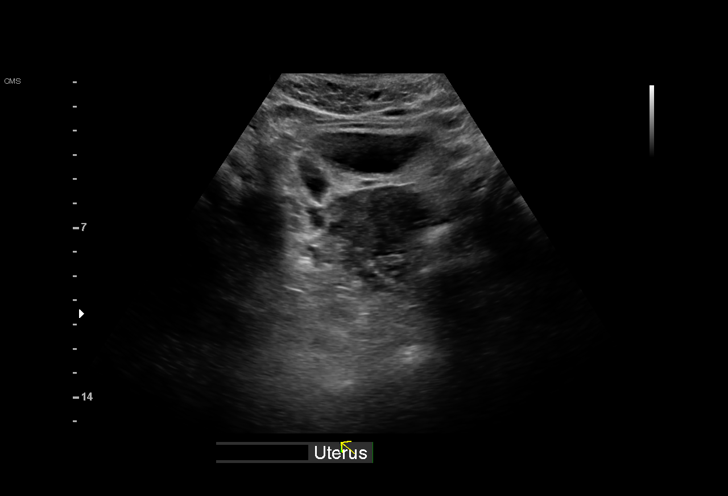
[im 9/53]
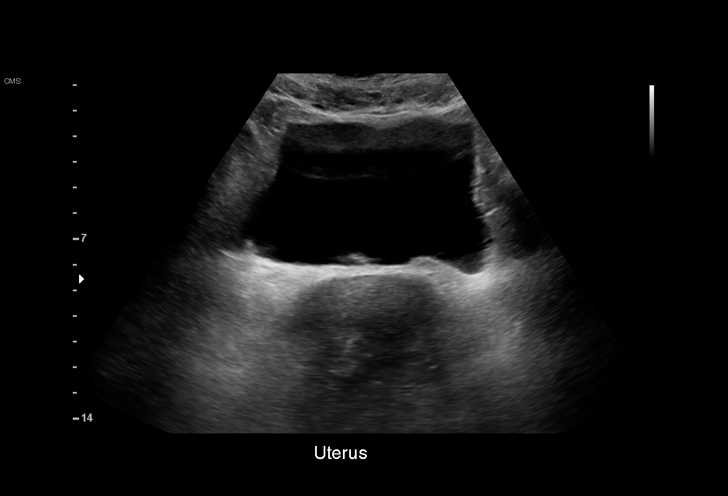
[im 11/53]
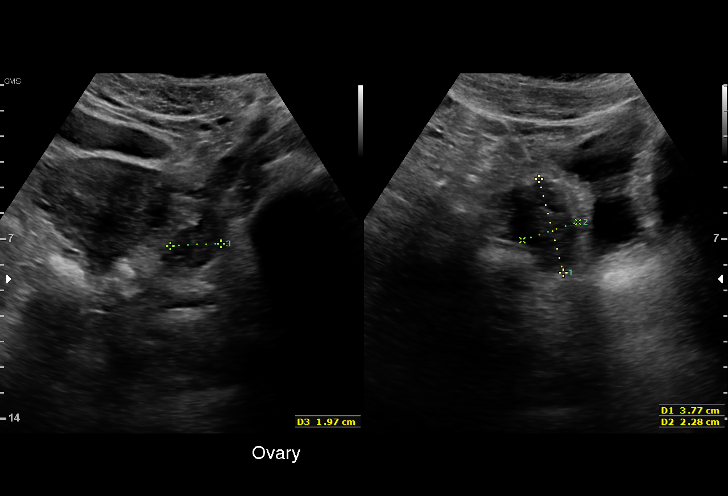
[im 16/53]
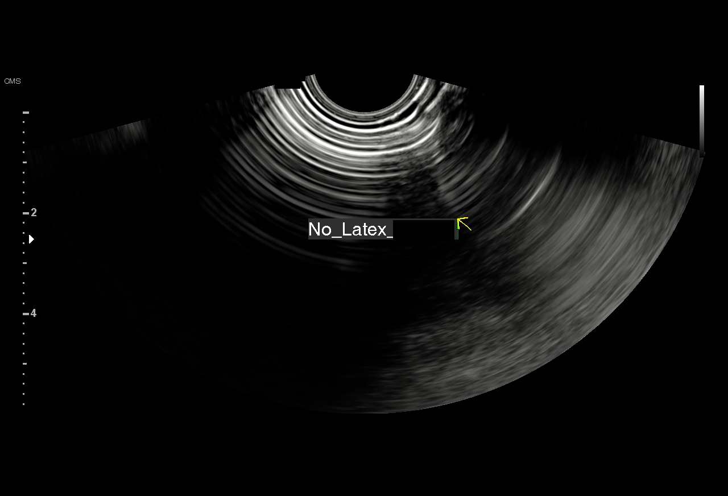
[im 20/53]
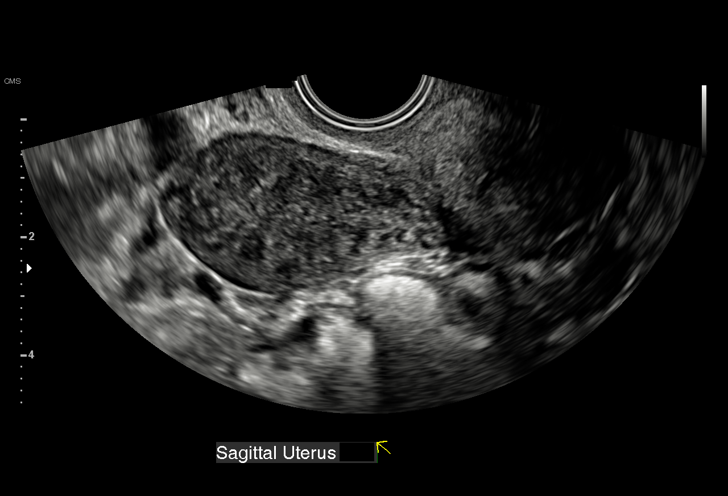
[im 22/53]
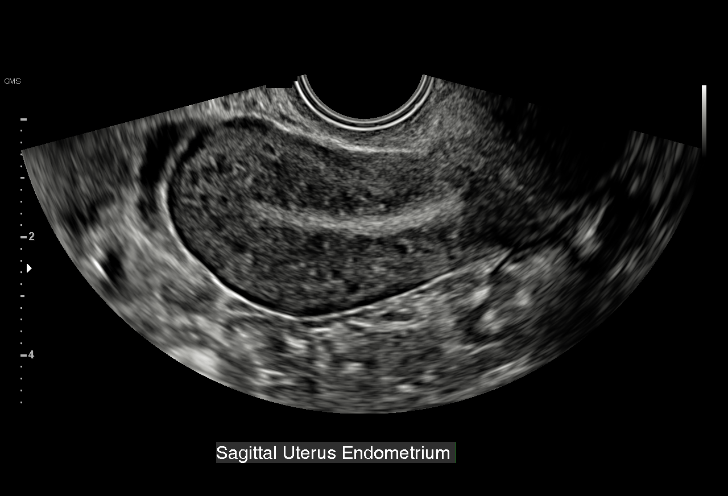
[im 27/53]
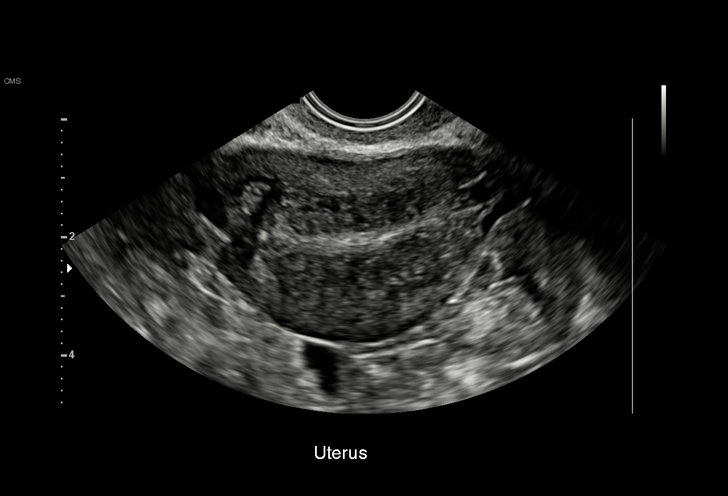
[im 31/53]
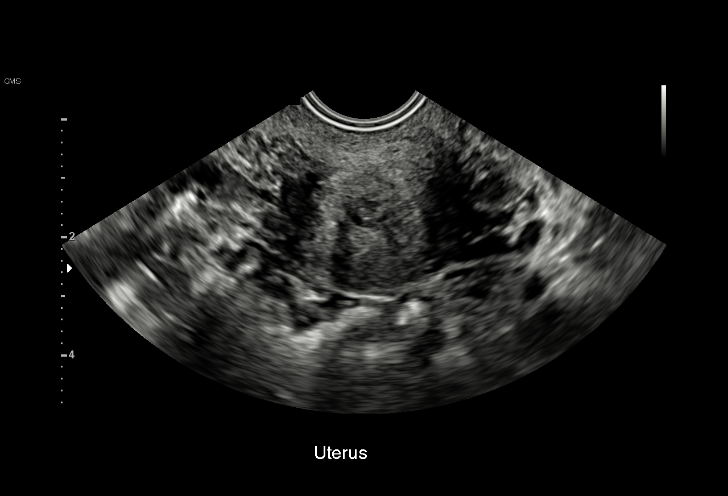
[im 33/53]
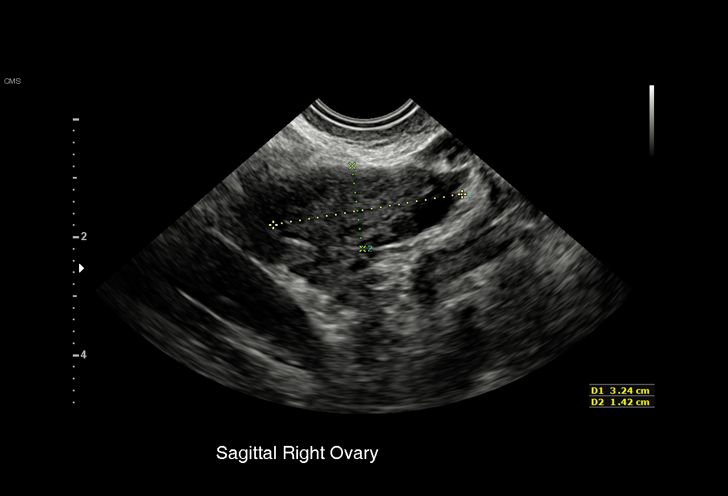
[im 37/53]
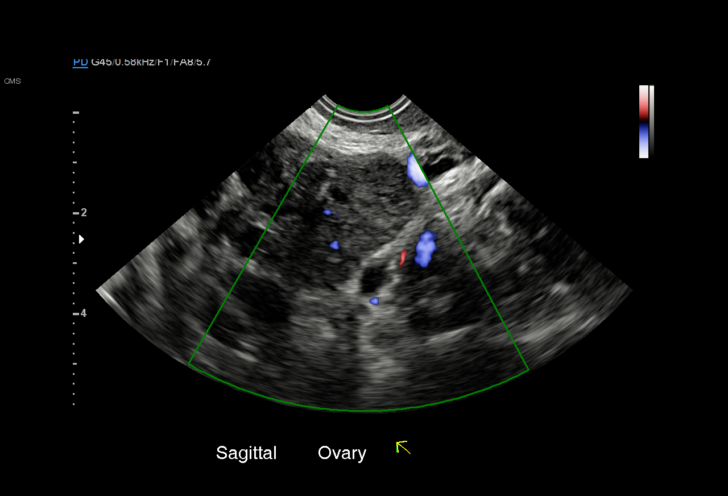
[im 42/53]
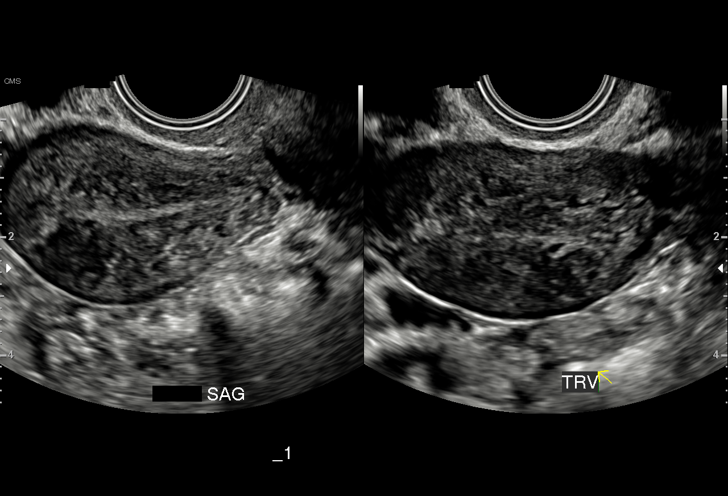
[im 44/53]
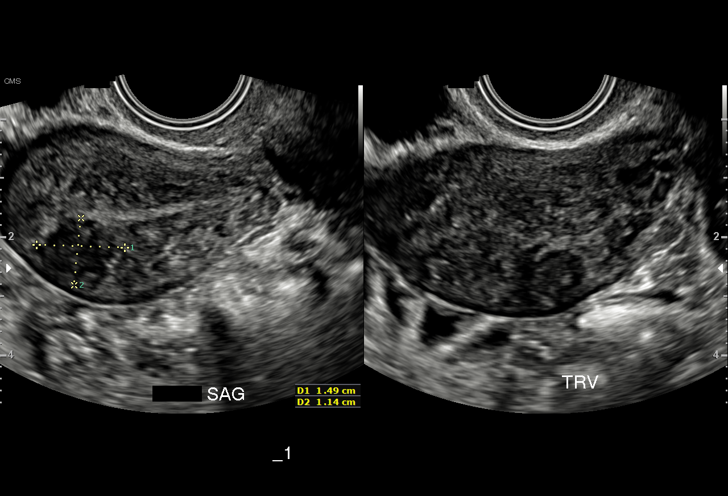
[im 48/53]
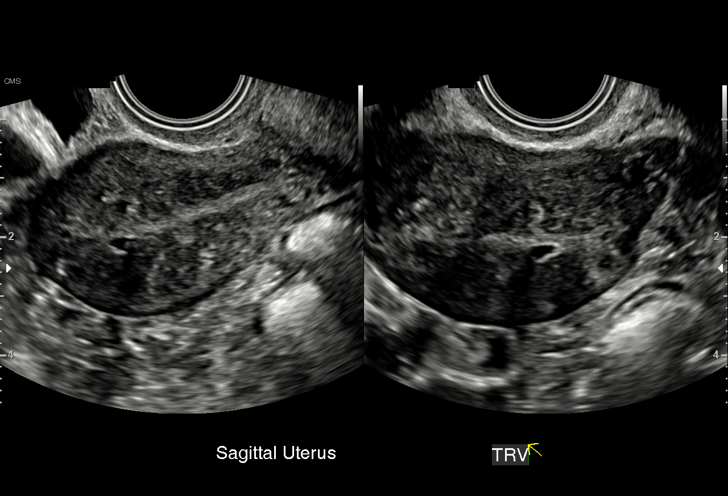
[im 53/53]
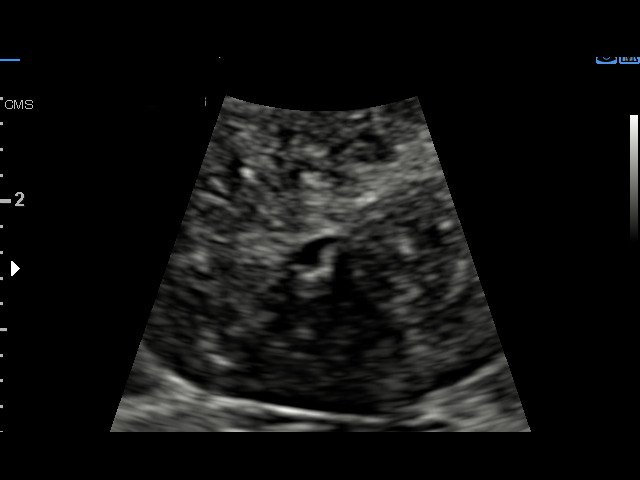

[15 of 25 positions shown; findings below may reference images not displayed]

FINDINGS: Uterus

Measurements: 7.7 x 3.3 x 4.9 cm. Diffusely heterogeneous myometrial
echotexture noted. At least 1 tiny cystic focus is seen at the
posterior endometrial-myometrial junction, consistent with
adenomyosis. Prior C-section scar noted.

Two small posterior fibroids are seen, largest measuring 1.5 cm in
maximum diameter. This fibroid is located in the left posterior
fundus and has a partial submucosal component.

Endometrium

Thickness: 4 mm.  No focal abnormality visualized.

Right ovary

Measurements: 3.2 x 1.4 x 1.7 cm. Normal appearance/no adnexal mass.

Left ovary

Measurements: 3.5 x 2.1 x 1.6 cm. Normal appearance/no adnexal mass.

Other findings

No abnormal free fluid.
IMPRESSION: Uterine adenomyosis and at least 2 small posterior fibroids, largest
measuring 1.5 cm.

Normal appearance of both ovaries.  No adnexal mass identified.

## 2017-03-15 ENCOUNTER — Emergency Department (HOSPITAL_COMMUNITY)
Admission: EM | Admit: 2017-03-15 | Discharge: 2017-03-15 | Disposition: A | Discharge status: Home / Self Care | Payer: Self-pay | Attending: Emergency Medicine | Admitting: Emergency Medicine

## 2017-03-15 ENCOUNTER — Encounter (HOSPITAL_COMMUNITY): Payer: Self-pay | Admitting: Emergency Medicine

## 2017-03-15 DIAGNOSIS — Z79899 Other long term (current) drug therapy: Secondary | ICD-10-CM | POA: Insufficient documentation

## 2017-03-15 DIAGNOSIS — I10 Essential (primary) hypertension: Secondary | ICD-10-CM | POA: Insufficient documentation

## 2017-03-15 DIAGNOSIS — K0889 Other specified disorders of teeth and supporting structures: Secondary | ICD-10-CM

## 2017-03-15 DIAGNOSIS — F17210 Nicotine dependence, cigarettes, uncomplicated: Secondary | ICD-10-CM | POA: Insufficient documentation

## 2017-03-15 DIAGNOSIS — R55 Syncope and collapse: Secondary | ICD-10-CM

## 2017-03-15 LAB — BASIC METABOLIC PANEL
ANION GAP: 15 (ref 5–15)
BUN: 11 mg/dL (ref 6–20)
CHLORIDE: 101 mmol/L (ref 101–111)
CO2: 21 mmol/L — AB (ref 22–32)
Calcium: 9.7 mg/dL (ref 8.9–10.3)
Creatinine, Ser: 1.17 mg/dL — ABNORMAL HIGH (ref 0.44–1.00)
GFR calc Af Amer: >60 mL/min (ref >60)
GFR calc non Af Amer: 56 mL/min — ABNORMAL LOW (ref >60)
GLUCOSE: 142 mg/dL — AB (ref 65–99)
Potassium: 3.3 mmol/L — ABNORMAL LOW (ref 3.5–5.1)
Sodium: 137 mmol/L (ref 135–145)

## 2017-03-15 LAB — URINALYSIS, ROUTINE W REFLEX MICROSCOPIC
Bilirubin Urine: NEGATIVE
GLUCOSE, UA: NEGATIVE mg/dL
HGB URINE DIPSTICK: NEGATIVE
Ketones, ur: NEGATIVE mg/dL
Leukocytes, UA: NEGATIVE
Nitrite: NEGATIVE
PH: 6 (ref 5.0–8.0)
Protein, ur: NEGATIVE mg/dL
SPECIFIC GRAVITY, URINE: 1.017 (ref 1.005–1.030)

## 2017-03-15 LAB — CBC
HEMATOCRIT: 45.3 % (ref 36.0–46.0)
HEMOGLOBIN: 16.3 g/dL — AB (ref 12.0–15.0)
MCH: 32.7 pg (ref 26.0–34.0)
MCHC: 36 g/dL (ref 30.0–36.0)
MCV: 91 fL (ref 78.0–100.0)
Platelets: 208 10*3/uL (ref 150–400)
RBC: 4.98 MIL/uL (ref 3.87–5.11)
RDW: 12.6 % (ref 11.5–15.5)
WBC: 11.8 10*3/uL — ABNORMAL HIGH (ref 4.0–10.5)

## 2017-03-15 LAB — I-STAT BETA HCG BLOOD, ED (MC, WL, AP ONLY): I-stat hCG, quantitative: <5 m[IU]/mL (ref <5)

## 2017-03-15 LAB — CBG MONITORING, ED: GLUCOSE-CAPILLARY: 153 mg/dL — AB (ref 65–99)

## 2017-03-15 MED ORDER — TRAMADOL HCL 50 MG PO TABS: 50.0000 mg | ORAL_TABLET | Freq: Four times a day (QID) | ORAL | 0 refills | Status: DC | PRN

## 2017-03-15 MED ORDER — AMOXICILLIN 500 MG PO CAPS: 500.0000 mg | ORAL_CAPSULE | Freq: Three times a day (TID) | ORAL | 0 refills | Status: DC

## 2017-03-15 NOTE — ED Notes (Signed)
Pt departed in NAD, refused use of wheelchair.  

## 2017-03-15 NOTE — ED Provider Notes (Signed)
[facility] EMERGENCY DEPARTMENT Provider Note   CSN: [id number] Arrival date & time: 03/15/17  0005     History   Chief Complaint Chief Complaint  Patient presents with  . Loss of Consciousness  . Dental Pain    HPI Katelyn [name] is a 42 y.o. female.  Patient presents to the emergency department for evaluation of syncope.  Patient reports that she has been dealing with a painful tooth for the last few days.  She has pain and swelling on the right lower side of her mouth.  She was feeling particular pain right before the episode.  She got up from lying in her bed and went to the bathroom.  When she got to the bathroom she started to feel woozy, dizzy and like she was going to pass out.  She tried to walk out of the bathroom and then did fall.  She thinks she fell up against a TV, does not have any particular area of injury from the fall.  She did not have headache before or after the fall.  She denies neck and back pain.  There was never any chest pain, heart palpitations, shortness of breath.  She has not had any recent illness, nausea, vomiting or diarrhea.      Past Medical History:  Diagnosis Date  . Anemia   . Anxiety   . Chest pain   . Depression   . Hypertension   . Migraine   . Palpitations     Patient Active Problem List   Diagnosis Date Noted  . Hyperlipidemia   . Hypertensive emergency 06/17/2016  . Generalized headaches 06/16/2016  . 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 AKI (acute kidney injury) ([place]) 01/14/2015  . Breast lump in upper outer quadrant 12/28/2014  . Ovarian torsion 11/25/2014  . Snoring 06/23/2014  . Abdominal pain, RLQ 06/23/2014  . Atypical chest pain 01/30/2014  . Migraine headache without aura 07/29/2012  . Elevated LFTs 04/26/2012  . GAD (generalized anxiety disorder) 03/20/2011  . Panic disorder with agoraphobia and moderate panic attacks 02/26/2009  . CIGARETTE SMOKER 11/24/2008  . Severe hypertension  03/05/2006    Past Surgical History:  Procedure Laterality Date  . HYSTEROSCOPY W/ ENDOMETRIAL ABLATION  2010  . LAPAROSCOPY N/A 11/25/2014   Procedure: LAPAROSCOPY OPERATIVE WITH LEFT OVARIAN CYSTECTOMY;  Surgeon: Osborne Oman, MD;  Location: [facility];  Service: Gynecology;  Laterality: N/A;  . TUBAL LIGATION  2006    OB History    Gravida Para Term Preterm AB Living   6 3 3   3 3    SAB TAB Ectopic Multiple Live Births     3             Home Medications    Prior to Admission medications   Medication Sig Start Date End Date Taking? Authorizing Provider  lisinopril-hydrochlorothiazide (PRINZIDE,ZESTORETIC) 20-12.5 MG tablet Take 2 tablets by mouth daily. 07/03/16  Yes Nicolette Bang, DO  atorvastatin (LIPITOR) 40 MG tablet Take 1 tablet (40 mg total) by mouth daily. Patient not taking: Reported on 03/15/2017 07/01/16   Zenia Resides, MD  carvedilol (COREG) 6.25 MG tablet Take 1 tablet (6.25 mg total) by mouth 2 (two) times daily with a meal. Patient not taking: Reported on 03/15/2017 06/18/16   Bufford Lope, DO  nicotine polacrilex (GNP NICOTINE POLACRILEX) 2 MG lozenge Take 1 lozenge (2 mg total) by mouth as needed for smoking cessation. Patient not taking: Reported on 03/15/2017 07/01/16  Zenia Resides, MD  spironolactone (ALDACTONE) 25 MG tablet Take 1 tablet (25 mg total) by mouth daily. Patient not taking: Reported on 03/15/2017 07/01/16   Zenia Resides, MD  citalopram (CELEXA) 10 MG tablet Take 1 tablet (10 mg total) by mouth daily. 09/12/10 03/20/11  Gregor Hams, MD  omeprazole (PRILOSEC) 20 MG capsule Take 1 capsule (20 mg total) by mouth daily. 07/25/10 03/20/11  Gregor Hams, MD  potassium chloride SA (K-DUR,KLOR-CON) 20 MEQ tablet Take 1 tablet (20 mEq total) by mouth daily. For 7 days 08/21/10 03/20/11  Katherina Mires, MD    Family History Family History  Problem Relation Age of Onset  . Diabetes Mother   . Depression Mother   . Hypertension Mother     . Hypertension Father   . Hypertension Brother   . Hypertension Brother     Social History Social History   Tobacco Use  . Smoking status: Current Every Day Smoker    Packs/day: 0.10    Years: 13.00    Pack years: 1.30    Types: Cigarettes    Start date: 01/06/1994  . Smokeless tobacco: Never Used  . Tobacco comment: smoking  hx Max 1 PPD; trying to quit, 06/2016, quit for 7 years  Substance Use Topics  . Alcohol use: Yes    Alcohol/week: 0.0 oz    Comment: LAST DRANK-  THURS- WINE COOLER  . Drug use: No     Allergies   Metoprolol   Review of Systems Review of Systems  HENT: Positive for dental problem.   Neurological: Positive for syncope.  All other systems reviewed and are negative.    Physical Exam Updated Vital Signs BP (!) 132/93 (BP Location: Right Arm)   Pulse 73   Temp 98.7 F (37.1 C) (Oral)   Resp 16   Ht 5\' 7"  (1.702 m)   Wt 90.7 kg (200 lb)   SpO2 99%   BMI 31.32 kg/m   Physical Exam  Constitutional: She is oriented to person, place, and time. She appears well-developed and well-nourished. No distress.  HENT:  Head: Normocephalic and atraumatic.  Right Ear: Hearing normal.  Left Ear: Hearing normal.  Nose: Nose normal.  Mouth/Throat: Oropharynx is clear and moist and mucous membranes are normal. Dental caries (tooth #31) present.    Eyes: Conjunctivae and EOM are normal. Pupils are equal, round, and reactive to light.  Neck: Normal range of motion. Neck supple.  Cardiovascular: Regular rhythm, S1 normal and S2 normal. Exam reveals no gallop and no friction rub.  No murmur heard. Pulmonary/Chest: Effort normal and breath sounds normal. No respiratory distress. She exhibits no tenderness.  Abdominal: Soft. Normal appearance and bowel sounds are normal. There is no hepatosplenomegaly. There is no tenderness. There is no rebound, no guarding, no tenderness at McBurney's point and negative Murphy's sign. No hernia.  Musculoskeletal: Normal  range of motion.  Neurological: She is alert and oriented to person, place, and time. She has normal strength. No cranial nerve deficit or sensory deficit. Coordination normal. GCS eye subscore is 4. GCS verbal subscore is 5. GCS motor subscore is 6.  Skin: Skin is warm, dry and intact. No rash noted. No cyanosis.  Psychiatric: She has a normal mood and affect. Her speech is normal and behavior is normal. Thought content normal.  Nursing note and vitals reviewed.    ED Treatments / Results  Labs (all labs ordered are listed, but only abnormal results are displayed) Labs Reviewed  BASIC METABOLIC PANEL - Abnormal; Notable for the following components:      Result Value   Potassium 3.3 (*)    CO2 21 (*)    Glucose, Bld 142 (*)    Creatinine, Ser 1.17 (*)    GFR calc non Af Amer 56 (*)    All other components within normal limits  CBC - Abnormal; Notable for the following components:   WBC 11.8 (*)    Hemoglobin 16.3 (*)    All other components within normal limits  CBG MONITORING, ED - Abnormal; Notable for the following components:   Glucose-Capillary 153 (*)    All other components within normal limits  URINALYSIS, ROUTINE W REFLEX MICROSCOPIC  I-STAT BETA HCG BLOOD, ED ([facility], [facility], [facility] ONLY)    EKG  EKG Interpretation  Date/Time:  Sunday March 15 2017 00:11:50 EST Ventricular Rate:  86 PR Interval:  206 QRS Duration: 100 QT Interval:  408 QTC Calculation: 488 R Axis:   -18 Text Interpretation:  Normal sinus rhythm Inferior infarct , age undetermined Cannot rule out Anterior infarct , age undetermined Abnormal ECG No significant change since last tracing Confirmed by Orpah Greek [phone]) on 03/15/2017 4:28:12 AM       Radiology No results found.  Procedures Procedures (including critical care time)  Medications Ordered in ED Medications - No data to display   Initial Impression / Assessment and Plan / ED Course  I have reviewed the triage vital signs and  the nursing notes.  Pertinent labs & imaging results that were available during my care of the patient were reviewed by me and considered in my medical decision making (see chart for details).     Patient presents to the emergency department for evaluation after syncopal episode.  Patient was experiencing pain at the time of her syncope.  She did have precursor symptoms of feeling woozy and like she was going to pass out.  There were no warning signs prior to the episode.  She does not have any risk for serious outcome per [facility] syncope rule.  Workup is entirely normal.  Patient reassured, no further workup necessary.  I do not believe that she needs any imaging of her brain as she does not have any evidence of significant injury from the fall and has normal neurologic function at this time.  Final Clinical Impressions(s) / ED Diagnoses   Final diagnoses:  Pain, dental  Syncope, unspecified syncope type    ED Discharge Orders    None       Orpah Greek, MD 03/15/17 [phone]

## 2017-03-15 NOTE — ED Triage Notes (Signed)
Reports syncopal episode today, states she remembers getting up to turn the hotspot on and woke up on the floor. Thinks she fell on the TV. Reports ongoing dental pain. Hx HTN

## 2017-07-13 ENCOUNTER — Other Ambulatory Visit: Payer: Self-pay

## 2017-07-13 DIAGNOSIS — I10 Essential (primary) hypertension: Secondary | ICD-10-CM

## 2017-07-15 ENCOUNTER — Other Ambulatory Visit: Payer: Self-pay

## 2017-07-15 DIAGNOSIS — I10 Essential (primary) hypertension: Secondary | ICD-10-CM

## 2017-07-15 MED ORDER — LISINOPRIL-HYDROCHLOROTHIAZIDE 20-12.5 MG PO TABS: 2.0000 | ORAL_TABLET | Freq: Every day | ORAL | 3 refills | Status: DC

## 2018-05-12 ENCOUNTER — Other Ambulatory Visit (HOSPITAL_COMMUNITY): Payer: Self-pay | Admitting: *Deleted

## 2018-05-12 DIAGNOSIS — Z1231 Encounter for screening mammogram for malignant neoplasm of breast: Secondary | ICD-10-CM

## 2018-06-12 ENCOUNTER — Other Ambulatory Visit: Payer: Self-pay

## 2018-06-12 ENCOUNTER — Emergency Department (HOSPITAL_COMMUNITY): Payer: Self-pay

## 2018-06-12 ENCOUNTER — Observation Stay (HOSPITAL_COMMUNITY)
Admission: EM | Admit: 2018-06-12 | Discharge: 2018-06-13 | Disposition: A | Discharge status: Home / Self Care | Payer: Self-pay | Attending: Family Medicine | Admitting: Family Medicine

## 2018-06-12 ENCOUNTER — Encounter (HOSPITAL_COMMUNITY): Payer: Self-pay | Admitting: Emergency Medicine

## 2018-06-12 DIAGNOSIS — R358 Other polyuria: Secondary | ICD-10-CM | POA: Insufficient documentation

## 2018-06-12 DIAGNOSIS — Z9114 Patient's other noncompliance with medication regimen: Secondary | ICD-10-CM | POA: Insufficient documentation

## 2018-06-12 DIAGNOSIS — Z1159 Encounter for screening for other viral diseases: Secondary | ICD-10-CM | POA: Insufficient documentation

## 2018-06-12 DIAGNOSIS — R0789 Other chest pain: Secondary | ICD-10-CM | POA: Diagnosis present

## 2018-06-12 DIAGNOSIS — G43909 Migraine, unspecified, not intractable, without status migrainosus: Secondary | ICD-10-CM | POA: Insufficient documentation

## 2018-06-12 DIAGNOSIS — D72829 Elevated white blood cell count, unspecified: Secondary | ICD-10-CM | POA: Insufficient documentation

## 2018-06-12 DIAGNOSIS — F329 Major depressive disorder, single episode, unspecified: Secondary | ICD-10-CM | POA: Insufficient documentation

## 2018-06-12 DIAGNOSIS — I10 Essential (primary) hypertension: Principal | ICD-10-CM

## 2018-06-12 DIAGNOSIS — R0602 Shortness of breath: Secondary | ICD-10-CM

## 2018-06-12 DIAGNOSIS — E785 Hyperlipidemia, unspecified: Secondary | ICD-10-CM | POA: Diagnosis present

## 2018-06-12 DIAGNOSIS — R519 Headache, unspecified: Secondary | ICD-10-CM | POA: Diagnosis present

## 2018-06-12 DIAGNOSIS — I160 Hypertensive urgency: Secondary | ICD-10-CM | POA: Diagnosis present

## 2018-06-12 DIAGNOSIS — F17210 Nicotine dependence, cigarettes, uncomplicated: Secondary | ICD-10-CM | POA: Insufficient documentation

## 2018-06-12 DIAGNOSIS — F411 Generalized anxiety disorder: Secondary | ICD-10-CM | POA: Insufficient documentation

## 2018-06-12 DIAGNOSIS — I119 Hypertensive heart disease without heart failure: Secondary | ICD-10-CM | POA: Diagnosis present

## 2018-06-12 DIAGNOSIS — Z79899 Other long term (current) drug therapy: Secondary | ICD-10-CM | POA: Insufficient documentation

## 2018-06-12 DIAGNOSIS — R9431 Abnormal electrocardiogram [ECG] [EKG]: Secondary | ICD-10-CM | POA: Insufficient documentation

## 2018-06-12 LAB — URINALYSIS, ROUTINE W REFLEX MICROSCOPIC
Bilirubin Urine: NEGATIVE
Glucose, UA: NEGATIVE mg/dL
Hgb urine dipstick: NEGATIVE
Ketones, ur: NEGATIVE mg/dL
Leukocytes,Ua: NEGATIVE
Nitrite: NEGATIVE
Protein, ur: NEGATIVE mg/dL
Specific Gravity, Urine: 1.012 (ref 1.005–1.030)
pH: 6 (ref 5.0–8.0)

## 2018-06-12 LAB — CBC
HCT: 45.3 % (ref 36.0–46.0)
Hemoglobin: 15.8 g/dL — ABNORMAL HIGH (ref 12.0–15.0)
MCH: 31.2 pg (ref 26.0–34.0)
MCHC: 34.9 g/dL (ref 30.0–36.0)
MCV: 89.5 fL (ref 80.0–100.0)
Platelets: 209 10*3/uL (ref 150–400)
RBC: 5.06 MIL/uL (ref 3.87–5.11)
RDW: 12.6 % (ref 11.5–15.5)
WBC: 14.1 10*3/uL — ABNORMAL HIGH (ref 4.0–10.5)
nRBC: 0 % (ref 0.0–0.2)

## 2018-06-12 LAB — BASIC METABOLIC PANEL
Anion gap: 15 (ref 5–15)
BUN: 9 mg/dL (ref 6–20)
CO2: 21 mmol/L — ABNORMAL LOW (ref 22–32)
Calcium: 9.4 mg/dL (ref 8.9–10.3)
Chloride: 103 mmol/L (ref 98–111)
Creatinine, Ser: 1.09 mg/dL — ABNORMAL HIGH (ref 0.44–1.00)
GFR calc Af Amer: >60 mL/min (ref >60)
GFR calc non Af Amer: >60 mL/min (ref >60)
Glucose, Bld: 109 mg/dL — ABNORMAL HIGH (ref 70–99)
Potassium: 3.7 mmol/L (ref 3.5–5.1)
Sodium: 139 mmol/L (ref 135–145)

## 2018-06-12 LAB — SARS CORONAVIRUS 2 BY RT PCR (HOSPITAL ORDER, PERFORMED IN ~~LOC~~ HOSPITAL LAB): SARS Coronavirus 2: NEGATIVE

## 2018-06-12 LAB — TROPONIN I
Troponin I: 0.03 ng/mL (ref <0.03)
Troponin I: 0.03 ng/mL (ref <0.03)

## 2018-06-12 LAB — I-STAT BETA HCG BLOOD, ED (MC, WL, AP ONLY): I-stat hCG, quantitative: <5 m[IU]/mL (ref <5)

## 2018-06-12 MED ORDER — HYDRALAZINE HCL 20 MG/ML IJ SOLN
20.0000 mg | INTRAMUSCULAR | Status: DC | PRN
  Administered 2018-06-12: 20 mg via INTRAVENOUS
  Filled 2018-06-12: qty 1

## 2018-06-12 MED ORDER — NICOTINE POLACRILEX 2 MG MT LOZG: 2.0000 mg | LOZENGE | OROMUCOSAL | Status: DC | PRN

## 2018-06-12 MED ORDER — NICOTINE 14 MG/24HR TD PT24
14.0000 mg | MEDICATED_PATCH | Freq: Every day | TRANSDERMAL | Status: DC
  Filled 2018-06-12: qty 1

## 2018-06-12 MED ORDER — LISINOPRIL-HYDROCHLOROTHIAZIDE 20-12.5 MG PO TABS: 2.0000 | ORAL_TABLET | Freq: Every day | ORAL | Status: DC

## 2018-06-12 MED ORDER — ACETAMINOPHEN 650 MG RE SUPP: 650.0000 mg | Freq: Four times a day (QID) | RECTAL | Status: DC | PRN

## 2018-06-12 MED ORDER — ENOXAPARIN SODIUM 40 MG/0.4ML ~~LOC~~ SOLN
40.0000 mg | SUBCUTANEOUS | Status: DC
  Administered 2018-06-12: 40 mg via SUBCUTANEOUS
  Filled 2018-06-12: qty 0.4

## 2018-06-12 MED ORDER — LISINOPRIL 20 MG PO TABS
20.0000 mg | ORAL_TABLET | Freq: Every day | ORAL | Status: DC
  Administered 2018-06-12 – 2018-06-13 (×2): 20 mg via ORAL
  Filled 2018-06-12 (×2): qty 1

## 2018-06-12 MED ORDER — HYDRALAZINE HCL 20 MG/ML IJ SOLN
20.0000 mg | INTRAMUSCULAR | Status: DC | PRN
  Filled 2018-06-12: qty 1

## 2018-06-12 MED ORDER — ACETAMINOPHEN 325 MG PO TABS
650.0000 mg | ORAL_TABLET | Freq: Four times a day (QID) | ORAL | Status: DC | PRN
  Administered 2018-06-12 – 2018-06-13 (×3): 650 mg via ORAL
  Filled 2018-06-12 (×3): qty 2

## 2018-06-12 MED ORDER — HYDRALAZINE HCL 20 MG/ML IJ SOLN: 20.0000 mg | INTRAMUSCULAR | Status: DC | PRN

## 2018-06-12 MED ORDER — ATORVASTATIN CALCIUM 40 MG PO TABS
40.0000 mg | ORAL_TABLET | Freq: Every day | ORAL | Status: DC
  Administered 2018-06-12: 40 mg via ORAL
  Filled 2018-06-12: qty 1

## 2018-06-12 MED ORDER — SODIUM CHLORIDE 0.9% FLUSH
3.0000 mL | Freq: Once | INTRAVENOUS | Status: AC
  Administered 2018-06-12: 3 mL via INTRAVENOUS

## 2018-06-12 MED ORDER — LABETALOL HCL 5 MG/ML IV SOLN
20.0000 mg | Freq: Once | INTRAVENOUS | Status: AC
  Administered 2018-06-12: 20 mg via INTRAVENOUS
  Filled 2018-06-12: qty 4

## 2018-06-12 MED ORDER — CARVEDILOL 6.25 MG PO TABS
6.2500 mg | ORAL_TABLET | Freq: Two times a day (BID) | ORAL | Status: DC
  Administered 2018-06-12 – 2018-06-13 (×2): 6.25 mg via ORAL
  Filled 2018-06-12 (×2): qty 1

## 2018-06-12 MED ORDER — HYDROCHLOROTHIAZIDE 12.5 MG PO CAPS
12.5000 mg | ORAL_CAPSULE | Freq: Every day | ORAL | Status: DC
  Administered 2018-06-12 – 2018-06-13 (×2): 12.5 mg via ORAL
  Filled 2018-06-12 (×2): qty 1

## 2018-06-12 MED ORDER — AZELASTINE HCL 0.1 % NA SOLN
1.0000 | Freq: Two times a day (BID) | NASAL | Status: DC
  Administered 2018-06-12 – 2018-06-13 (×2): 1 via NASAL
  Filled 2018-06-12: qty 30

## 2018-06-12 NOTE — ED Notes (Signed)
Pt mother - Arlan Organ [phone]

## 2018-06-12 NOTE — ED Notes (Signed)
Pt updating family on phone

## 2018-06-12 NOTE — ED Triage Notes (Signed)
Pt states she starting having pressure chest pain in the shower. States he has not had BP meds for 2 days. BP 267/163 on arrival. Describes as pressure and not pain. Took motrin with no relief.

## 2018-06-12 NOTE — ED Notes (Signed)
Patient transported to X-ray 

## 2018-06-12 NOTE — H&P (Addendum)
[facility] Admission History and Physical Service Pager: [phone]  Patient name: Katelyn [name] Medical record number: [id number] Date of birth: [birth year] Age: 43 y.o. Gender: female  Primary Care Provider: Benay Pike, MD Consultants: none Code Status: FULL  Chief Complaint: SOB  Assessment and Plan: Katelyn [name] is a 43 y.o. female presenting with chest discomfort and shortness of breath.  Previous medical history significant for hypertension, medical noncompliance, nicotine use.  Hypertension without signs of endorgan damage She reports feeling shortness of breath at home with a 10-minute episode of arm tingling which resolved spontaneously.  In the ED she was found to have a blood pressure of 259/151.  Labs show creatinine 1.09 (BL 1.1), troponin 0 0.03, WBC 14.1.  In the ED, she was given labetalol 20 mg IV with mild reduction in her blood pressure to 239/134.  She reports that she has not taken any blood pressure medication in at least 2 days.  Most likely cause of her hypertension at this time is poor medication compliance there may be additional contributions.  Plan to admit and restart home medication while monitoring her for signs of endorgan and organ damage. Consider maximizing home agents for highest tolerable dose before adding additional agents (e.g. increasing lisinopril/HCTZ) -admit to progressive, attending Dr. McDiarmid -tele -lisinopril20/HCTZ12.5 daily -carvedilol 6.25 BID (has not been taking at home) -hydralazine 20 mg Q 4 for BHALPFXTK>240 diastolics < 973 -monitor vitals Q 2 hours -STAT head CT for any sign of new focal neural deficits  Chest discomfort She reports an atypical chest pain which feels like a "blockage" of her inspiration in addition to a 10-minute episode of left arm tingling.  Initial EKG shows no signs of ischemia.  Initial troponin 0 0.03. -Trend troponin -A.m. EKG -Telemetry  Polyuria She reports that  she urinates as frequently as every 1.5 hours.  She denies dysuria, suprapubic tenderness or back pain.  WBC 14.1. -UA with reflex culture -A1C  FEN/GI: General diet Prophylaxis: Lovenox  Disposition: Possible discharge home in 6/7  History of Present Illness:  Katelyn [name] is a 43 y.o. female presenting with chest discomfort and shortness of breath.  Previous medical history significant for hypertension, medical noncompliance, nicotine use.  She reports that she began to experience shortness of breath and lightheadedness again at about 10 AM this morning.   She reported that she felt that she had trouble taking big breaths and because she felt something blocking her breaths in.  At about 12:00 she also noted left arm tingling that lasted for about 10 minutes.  She decided to come in to be evaluated.  She specifically denies vision changes, weakness in any part of the body, hearing changes, facial weakness.  In the ED, she is found to have a blood pressure of 169/151.  She was given IV labetalol 20 mg.  Review Of Systems: Per HPI with the following additions:   Review of Systems  Constitutional: Negative for fever and malaise/fatigue.  HENT: Negative for congestion, hearing loss and sore throat.   Eyes: Negative for blurred vision and double vision.    Patient Active Problem List   Diagnosis Date Noted  . Hypertension 06/12/2018  . Hyperlipidemia   . Hypertensive emergency 06/17/2016  . Generalized headaches 06/16/2016  . 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 AKI (acute kidney injury) ([place]) 01/14/2015  . Breast lump in upper outer quadrant 12/28/2014  . Ovarian torsion 11/25/2014  . Snoring 06/23/2014  . Abdominal pain,  RLQ 06/23/2014  . Atypical chest pain 01/30/2014  . Migraine headache without aura 07/29/2012  . Elevated LFTs 04/26/2012  . GAD (generalized anxiety disorder) 03/20/2011  . Panic disorder with agoraphobia and moderate panic attacks 02/26/2009  .  CIGARETTE SMOKER 11/24/2008  . Severe hypertension 03/05/2006    Past Medical History: Past Medical History:  Diagnosis Date  . Anemia   . Anxiety   . Chest pain   . Depression   . Hypertension   . Migraine   . Palpitations     Past Surgical History: Past Surgical History:  Procedure Laterality Date  . HYSTEROSCOPY W/ ENDOMETRIAL ABLATION  2010  . LAPAROSCOPY N/A 11/25/2014   Procedure: LAPAROSCOPY OPERATIVE WITH LEFT OVARIAN CYSTECTOMY;  Surgeon: Osborne Oman, MD;  Location: [facility];  Service: Gynecology;  Laterality: N/A;  . TUBAL LIGATION  2006    Social History: Social History   Tobacco Use  . Smoking status: Current Every Day Smoker    Packs/day: 0.10    Years: 13.00    Pack years: 1.30    Types: Cigarettes    Start date: 01/06/1994  . Smokeless tobacco: Never Used  . Tobacco comment: smoking  hx Max 1 PPD; trying to quit, 06/2016, quit for 7 years  Substance Use Topics  . Alcohol use: Yes    Alcohol/week: 0.0 standard drinks    Comment: LAST DRANK-  THURS- WINE COOLER  . Drug use: No   Additional social history: Lives with boyfriend Please also refer to relevant sections of EMR.  Family History: Family History  Problem Relation Age of Onset  . Diabetes Mother   . Depression Mother   . Hypertension Mother   . Hypertension Father   . Hypertension Brother   . Hypertension Brother      Allergies and Medications: Allergies  Allergen Reactions  . Metoprolol Nausea Only    Makes her feel very sick when taking med   No current facility-administered medications on file prior to encounter.    Current Outpatient Medications on File Prior to Encounter  Medication Sig Dispense Refill  . atorvastatin (LIPITOR) 40 MG tablet Take 1 tablet (40 mg total) by mouth daily. (Patient not taking: Reported on 03/15/2017) 30 tablet 3  . carvedilol (COREG) 6.25 MG tablet Take 1 tablet (6.25 mg total) by mouth 2 (two) times daily with a meal. (Patient not taking:  Reported on 03/15/2017) 60 tablet 0  . lisinopril-hydrochlorothiazide (PRINZIDE,ZESTORETIC) 20-12.5 MG tablet Take 2 tablets by mouth daily. 180 tablet 3  . nicotine polacrilex (GNP NICOTINE POLACRILEX) 2 MG lozenge Take 1 lozenge (2 mg total) by mouth as needed for smoking cessation. (Patient not taking: Reported on 03/15/2017) 100 tablet 0  . spironolactone (ALDACTONE) 25 MG tablet Take 1 tablet (25 mg total) by mouth daily. (Patient not taking: Reported on 03/15/2017) 30 tablet 3  . traMADol (ULTRAM) 50 MG tablet Take 1 tablet (50 mg total) by mouth every 6 (six) hours as needed. 15 tablet 0  . [DISCONTINUED] citalopram (CELEXA) 10 MG tablet Take 1 tablet (10 mg total) by mouth daily. 30 tablet 2  . [DISCONTINUED] omeprazole (PRILOSEC) 20 MG capsule Take 1 capsule (20 mg total) by mouth daily. 30 capsule 2  . [DISCONTINUED] potassium chloride SA (K-DUR,KLOR-CON) 20 MEQ tablet Take 1 tablet (20 mEq total) by mouth daily. For 7 days 7 tablet 0    Objective: BP (!) 242/207   Pulse 96   Temp 99.2 F (37.3 C) (Oral)   Resp  15   SpO2 99%   Physical Exam Constitutional:      General: She is not in acute distress.    Appearance: She is well-developed. She is not ill-appearing.  HENT:     Nose: Nose normal.  Eyes:     Extraocular Movements: Extraocular movements intact.     Pupils: Pupils are equal, round, and reactive to light.  Neck:     Musculoskeletal: Normal range of motion and neck supple.  Cardiovascular:     Rate and Rhythm: Normal rate and regular rhythm.     Heart sounds: Murmur present. Crescendo  systolic murmur present with a grade of 2/6.  Pulmonary:     Effort: Pulmonary effort is normal.     Breath sounds: Normal breath sounds.  Chest:     Chest wall: No mass or tenderness.  Abdominal:     General: Bowel sounds are normal.     Palpations: Abdomen is soft.  Musculoskeletal:        General: No swelling or deformity.  Skin:    General: Skin is warm and dry.      Capillary Refill: Capillary refill takes less than 2 seconds.     Coloration: Skin is not jaundiced or pale.  Neurological:     General: No focal deficit present.     Mental Status: She is alert and oriented to person, place, and time. Mental status is at baseline.     Cranial Nerves: No cranial nerve deficit.     Motor: No weakness.  Psychiatric:        Mood and Affect: Mood normal.        Thought Content: Thought content normal.      Labs and Imaging: CBC BMET  Recent Labs  Lab 06/12/18 1500  WBC 14.1*  HGB 15.8*  HCT 45.3  PLT 209   Recent Labs  Lab 06/12/18 1500  NA 139  K 3.7  CL 103  CO2 21*  BUN 9  CREATININE 1.09*  GLUCOSE 109*  CALCIUM 9.4     Dg Chest 2 View  Result Date: 06/12/2018 CLINICAL DATA:  Chest pain EXAM: CHEST - 2 VIEW COMPARISON:  June 16, 2016 FINDINGS: Lungs are clear. Heart is upper normal in size with pulmonary vascularity normal. No adenopathy. No bone lesions. No pneumothorax. IMPRESSION: No edema or consolidation. Heart upper normal in size. No adenopathy. Electronically Signed   By: Lowella Grip III M.D.   On: 06/12/2018 15:44     Matilde Haymaker, MD 06/12/2018, 5:11 PM PGY-1, [place] Intern pager: [phone], text pages welcome  [place]   I have seen and examined this patient.    I have discussed the findings and exam with the intern and agree with the above note, which I have edited appropriately in [place]. I helped develop the management plan that is described in the resident's note, and I agree with the content.   Marny Lowenstein, MD, MS FAMILY MEDICINE RESIDENT - PGY2 06/12/2018 5:42 PM

## 2018-06-12 NOTE — ED Notes (Signed)
ED TO INPATIENT HANDOFF REPORT  ED Nurse Name and Phone #: [phone] Doreene Adas Name/Age/Gender Katelyn [name] 43 y.o. female Room/Bed: 045C/045C  Code Status   Code Status: Prior  Home/SNF/Other Home Patient oriented to: self, place, time and situation Is this baseline? Yes   Triage Complete: Triage complete  Chief Complaint SOB. N/V/D  Triage Note Pt states she starting having pressure chest pain in the shower. States he has not had BP meds for 2 days. BP 267/163 on arrival. Describes as pressure and not pain. Took motrin with no relief.    Allergies Allergies  Allergen Reactions  . Metoprolol Nausea Only    Makes her feel very sick when taking med    Level of [name]/Admitting Diagnosis ED Disposition    ED Disposition Condition [place]: [place] [100100]  Level of [name]: Progressive [102]  Covid Evaluation: Screening Protocol (No Symptoms)  Diagnosis: Hypertension [174081]  Admitting Physician: Acquanetta Sit Stacy.Beals  Attending Physician: MCDIARMID, TODD D [[id number]]  Estimated length of stay: past midnight tomorrow  Certification:: I certify this patient will need inpatient services for at least 2 midnights  PT Class (Do Not Modify): Inpatient [101]  PT Acc Code (Do Not Modify): Private [1]       B Medical/Surgery History Past Medical History:  Diagnosis Date  . Anemia   . Anxiety   . Chest pain   . Depression   . Hypertension   . Migraine   . Palpitations    Past Surgical History:  Procedure Laterality Date  . HYSTEROSCOPY W/ ENDOMETRIAL ABLATION  2010  . LAPAROSCOPY N/A 11/25/2014   Procedure: LAPAROSCOPY OPERATIVE WITH LEFT OVARIAN CYSTECTOMY;  Surgeon: Osborne Oman, MD;  Location: [facility];  Service: Gynecology;  Laterality: N/A;  . TUBAL LIGATION  2006     A IV Location/Drains/Wounds Patient Lines/Drains/Airways Status   Active Line/Drains/Airways    Name:   Placement date:   Placement time:    Site:   Days:   Incision (Closed) 11/25/14 Vagina   11/25/14    0757     1295   Incision - 3 Ports Abdomen Umbilicus Right;Lower Left;Lower   11/25/14    -     1295          Intake/Output Last 24 hours No intake or output data in the 24 hours ending 06/12/18 1647  Labs/Imaging Results for orders placed or performed during the hospital encounter of 06/12/18 (from the past 48 hour(s))  Basic metabolic panel     Status: Abnormal   Collection Time: 06/12/18  3:00 PM  Result Value Ref Range   Sodium 139 135 - 145 mmol/L   Potassium 3.7 3.5 - 5.1 mmol/L   Chloride 103 98 - 111 mmol/L   CO2 21 (L) 22 - 32 mmol/L   Glucose, Bld 109 (H) 70 - 99 mg/dL   BUN 9 6 - 20 mg/dL   Creatinine, Ser 1.09 (H) 0.44 - 1.00 mg/dL   Calcium 9.4 8.9 - 10.3 mg/dL   GFR calc non Af Amer >60 >60 mL/min   GFR calc Af Amer >60 >60 mL/min   Anion gap 15 5 - 15    Comment: Performed at [facility], [place] [address], [place], [place] [address]  CBC     Status: Abnormal   Collection Time: 06/12/18  3:00 PM  Result Value Ref Range   WBC 14.1 (H) 4.0 - 10.5 K/uL  RBC 5.06 3.87 - 5.11 MIL/uL   Hemoglobin 15.8 (H) 12.0 - 15.0 g/dL   HCT 45.3 36.0 - 46.0 %   MCV 89.5 80.0 - 100.0 fL   MCH 31.2 26.0 - 34.0 pg   MCHC 34.9 30.0 - 36.0 g/dL   RDW 12.6 11.5 - 15.5 %   Platelets 209 150 - 400 K/uL   nRBC 0.0 0.0 - 0.2 %    Comment: Performed at [facility], [place] [address], [place], [place] [address]  Troponin I - ONCE - STAT     Status: Abnormal   Collection Time: 06/12/18  3:00 PM  Result Value Ref Range   Troponin I 0.03 (HH) <0.03 ng/mL    Comment: CRITICAL RESULT CALLED TO, READ BACK BY AND VERIFIED WITH: Moishe Spice RN AT 8828 06/12/2018 BY [facility] Performed at [facility], [address], [place],  [address]   I-Stat beta hCG blood, ED     Status: None   Collection Time: 06/12/18  3:18 PM  Result Value Ref Range   I-stat hCG, quantitative <5.0 <5 mIU/mL   Comment 3             Comment:   GEST. AGE      CONC.  (mIU/mL)   <=1 WEEK        5 - 50     2 WEEKS       50 - 500     3 WEEKS       100 - 10,000     4 WEEKS     1,000 - 30,000        FEMALE AND NON-PREGNANT FEMALE:     LESS THAN 5 mIU/mL    Dg Chest 2 View  Result Date: 06/12/2018 CLINICAL DATA:  Chest pain EXAM: CHEST - 2 VIEW COMPARISON:  June 16, 2016 FINDINGS: Lungs are clear. Heart is upper normal in size with pulmonary vascularity normal. No adenopathy. No bone lesions. No pneumothorax. IMPRESSION: No edema or consolidation. Heart upper normal in size. No adenopathy. Electronically Signed   By: Lowella Grip III M.D.   On: 06/12/2018 15:44    Pending Labs Unresulted Labs (From admission, onward)    Start     Ordered   06/12/18 1508  SARS Coronavirus 2 (CEPHEID - Performed in [place] hospital lab), Hosp Order  (Asymptomatic Patients Labs)  Once,   R    Question:  Rule Out  Answer:  Yes   06/12/18 1507          Vitals/Pain Today's Vitals   06/12/18 1457 06/12/18 1500 06/12/18 1515 06/12/18 1615  BP:  (!) 269/151 (!) 239/134 (!) 242/207  Pulse:  100 96   Resp:  19 17 15   Temp:      TempSrc:      SpO2:  99% 99%   PainSc: 3        Isolation Precautions No active isolations  Medications Medications  sodium chloride flush (NS) 0.9 % injection 3 mL (has no administration in time range)  labetalol (NORMODYNE) injection 20 mg (20 mg Intravenous Given 06/12/18 1514)    Mobility walks Low fall risk   Focused Assessments Cardiac Assessment Handoff:    Lab Results  Component Value Date   ZPHXTAV 697 08/20/2010   TROPONINI 0.03 ([place]) 06/12/2018   No results found for: DDIMER Does the Patient currently have chest pain? no    R Recommendations: See Admitting Provider Note  Report given  to:   Additional Notes:

## 2018-06-12 NOTE — ED Provider Notes (Signed)
[facility] EMERGENCY DEPARTMENT Provider Note   CSN: [id number] Arrival date & time: 06/12/18  1435    History   Chief Complaint Chief Complaint  Patient presents with  . Chest Pain    HPI Katelyn [name] is a 43 y.o. female.     HPI Patient presents with dull chest pain.  Began while she was in the shower today.  Found to have blood pressure of 250/145.  With a history of hypertension.  Has been off her medicines for the last couple days, and states she has been taking regularly before that.  Pain is dull.  Has not had pain with exertion.  No swelling in her legs.  No fevers.  No cough. Past Medical History:  Diagnosis Date  . Anemia   . Anxiety   . Chest pain   . Depression   . Hypertension   . Migraine   . Palpitations     Patient Active Problem List   Diagnosis Date Noted  . Hyperlipidemia   . Hypertensive emergency 06/17/2016  . Generalized headaches 06/16/2016  . 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 AKI (acute kidney injury) ([place]) 01/14/2015  . Breast lump in upper outer quadrant 12/28/2014  . Ovarian torsion 11/25/2014  . Snoring 06/23/2014  . Abdominal pain, RLQ 06/23/2014  . Atypical chest pain 01/30/2014  . Migraine headache without aura 07/29/2012  . Elevated LFTs 04/26/2012  . GAD (generalized anxiety disorder) 03/20/2011  . Panic disorder with agoraphobia and moderate panic attacks 02/26/2009  . CIGARETTE SMOKER 11/24/2008  . Severe hypertension 03/05/2006    Past Surgical History:  Procedure Laterality Date  . HYSTEROSCOPY W/ ENDOMETRIAL ABLATION  2010  . LAPAROSCOPY N/A 11/25/2014   Procedure: LAPAROSCOPY OPERATIVE WITH LEFT OVARIAN CYSTECTOMY;  Surgeon: Osborne Oman, MD;  Location: [facility];  Service: Gynecology;  Laterality: N/A;  . TUBAL LIGATION  2006     OB History    Gravida  6   Para  3   Term  3   Preterm      AB  3   Living  3     SAB      TAB  3   Ectopic      Multiple      Live  Births               Home Medications    Prior to Admission medications   Medication Sig Start Date End Date Taking? Authorizing Provider  amoxicillin (AMOXIL) 500 MG capsule Take 1 capsule (500 mg total) by mouth 3 (three) times daily. 03/15/17   Orpah Greek, MD  atorvastatin (LIPITOR) 40 MG tablet Take 1 tablet (40 mg total) by mouth daily. Patient not taking: Reported on 03/15/2017 07/01/16   Zenia Resides, MD  carvedilol (COREG) 6.25 MG tablet Take 1 tablet (6.25 mg total) by mouth 2 (two) times daily with a meal. Patient not taking: Reported on 03/15/2017 06/18/16   Bufford Lope, DO  lisinopril-hydrochlorothiazide (PRINZIDE,ZESTORETIC) 20-12.5 MG tablet Take 2 tablets by mouth daily. 07/15/17   Benay Pike, MD  nicotine polacrilex (GNP NICOTINE POLACRILEX) 2 MG lozenge Take 1 lozenge (2 mg total) by mouth as needed for smoking cessation. Patient not taking: Reported on 03/15/2017 07/01/16   Zenia Resides, MD  spironolactone (ALDACTONE) 25 MG tablet Take 1 tablet (25 mg total) by mouth daily. Patient not taking: Reported on 03/15/2017 07/01/16   Zenia Resides, MD  traMADol Veatrice Bourbon) 50  MG tablet Take 1 tablet (50 mg total) by mouth every 6 (six) hours as needed. 03/15/17   Orpah Greek, MD  citalopram (CELEXA) 10 MG tablet Take 1 tablet (10 mg total) by mouth daily. 09/12/10 03/20/11  Gregor Hams, MD  omeprazole (PRILOSEC) 20 MG capsule Take 1 capsule (20 mg total) by mouth daily. 07/25/10 03/20/11  Gregor Hams, MD  potassium chloride SA (K-DUR,KLOR-CON) 20 MEQ tablet Take 1 tablet (20 mEq total) by mouth daily. For 7 days 08/21/10 03/20/11  Katherina Mires, MD    Family History Family History  Problem Relation Age of Onset  . Diabetes Mother   . Depression Mother   . Hypertension Mother   . Hypertension Father   . Hypertension Brother   . Hypertension Brother     Social History Social History   Tobacco Use  . Smoking status: Current Every Day  Smoker    Packs/day: 0.10    Years: 13.00    Pack years: 1.30    Types: Cigarettes    Start date: 01/06/1994  . Smokeless tobacco: Never Used  . Tobacco comment: smoking  hx Max 1 PPD; trying to quit, 06/2016, quit for 7 years  Substance Use Topics  . Alcohol use: Yes    Alcohol/week: 0.0 standard drinks    Comment: LAST DRANK-  THURS- WINE COOLER  . Drug use: No     Allergies   Metoprolol   Review of Systems Review of Systems  Constitutional: Negative for appetite change.  HENT: Negative for congestion.   Respiratory: Negative for shortness of breath.   Cardiovascular: Positive for chest pain. Negative for leg swelling.  Gastrointestinal: Negative for abdominal pain.  Genitourinary: Negative for flank pain.  Musculoskeletal: Negative for back pain.  Skin: Negative for rash.  Neurological: Negative for weakness.  Psychiatric/Behavioral: Negative for confusion.     Physical Exam Updated Vital Signs BP (!) 239/134   Pulse 96   Temp 99.2 F (37.3 C) (Oral)   Resp 17   SpO2 99%   Physical Exam Vitals signs and nursing note reviewed.  Constitutional:      Appearance: She is well-developed.  HENT:     Head: Normocephalic.  Cardiovascular:     Rate and Rhythm: Regular rhythm. Tachycardia present.  Pulmonary:     Effort: No respiratory distress.     Breath sounds: No stridor. No wheezing, rhonchi or rales.  Abdominal:     Tenderness: There is no abdominal tenderness.  Musculoskeletal:     Right lower leg: She exhibits no tenderness.     Left lower leg: She exhibits no tenderness.  Skin:    Capillary Refill: Capillary refill takes less than 2 seconds.  Neurological:     Mental Status: She is alert and oriented to person, place, and time.      ED Treatments / Results  Labs (all labs ordered are listed, but only abnormal results are displayed) Labs Reviewed  BASIC METABOLIC PANEL - Abnormal; Notable for the following components:      Result Value   CO2 21  (*)    Glucose, Bld 109 (*)    Creatinine, Ser 1.09 (*)    All other components within normal limits  CBC - Abnormal; Notable for the following components:   WBC 14.1 (*)    Hemoglobin 15.8 (*)    All other components within normal limits  TROPONIN I - Abnormal; Notable for the following components:   Troponin I 0.03 (*)  All other components within normal limits  SARS CORONAVIRUS 2 (HOSPITAL ORDER, [facility])  I-STAT BETA HCG BLOOD, ED ([facility], [facility], [facility] ONLY)    EKG EKG Interpretation  Date/Time:  Saturday June 12 2018 14:49:43 EDT Ventricular Rate:  104 PR Interval:    QRS Duration: 101 QT Interval:  378 QTC Calculation: 498 R Axis:   -38 Text Interpretation:  Sinus tachycardia Biatrial enlargement Left ventricular hypertrophy ST elev, probable normal early repol pattern Borderline prolonged QT interval Baseline wander in lead(s) II III aVF V2 Confirmed by Davonna Belling [id number]) on 06/12/2018 2:55:03 PM   Radiology Dg Chest 2 View  Result Date: 06/12/2018 CLINICAL DATA:  Chest pain EXAM: CHEST - 2 VIEW COMPARISON:  June 16, 2016 FINDINGS: Lungs are clear. Heart is upper normal in size with pulmonary vascularity normal. No adenopathy. No bone lesions. No pneumothorax. IMPRESSION: No edema or consolidation. Heart upper normal in size. No adenopathy. Electronically Signed   By: Lowella Grip III M.D.   On: 06/12/2018 15:44    Procedures Procedures (including critical care time)  Medications Ordered in ED Medications  sodium chloride flush (NS) 0.9 % injection 3 mL (has no administration in time range)  labetalol (NORMODYNE) injection 20 mg (20 mg Intravenous Given 06/12/18 1514)     Initial Impression / Assessment and Plan / ED Course  I have reviewed the triage vital signs and the nursing notes.  Pertinent labs & imaging results that were available during my care of the patient were reviewed by me and considered in my medical decision making  (see chart for details).        Patient presents with chest pain and pressure.  Has been off her blood pressure medicines for least 2 days.  Severely hypertensive here.  Troponin mildly elevated.  Does have dull chest pressure.  I think patient has severe is not hypertension a potential endorgan damage with the mildly elevated troponin to necessitate more urgent lowering of the blood pressure.  Had been given IV labetalol and will be started under drip by admitting physicians.  We will hold off to decision to them to decide what agent they would like.  Will admit to hospita.  CRITICAL CARE Performed by: Davonna Belling Total critical care time: 30 minutes Critical care time was exclusive of separately billable procedures and treating other patients. Critical care was necessary to treat or prevent imminent or life-threatening deterioration. Critical care was time spent personally by me on the following activities: development of treatment plan with patient and/or surrogate as well as nursing, discussions with consultants, evaluation of patient's response to treatment, examination of patient, obtaining history from patient or surrogate, ordering and performing treatments and interventions, ordering and review of laboratory studies, ordering and review of radiographic studies, pulse oximetry and re-evaluation of patient's condition.   Final Clinical Impressions(s) / ED Diagnoses   Final diagnoses:  Hypertension, malignant    ED Discharge Orders    None       Davonna Belling, MD 06/12/18 1623

## 2018-06-13 ENCOUNTER — Other Ambulatory Visit: Payer: Self-pay | Admitting: Family Medicine

## 2018-06-13 ENCOUNTER — Encounter (HOSPITAL_COMMUNITY): Payer: Self-pay | Admitting: Family Medicine

## 2018-06-13 DIAGNOSIS — I119 Hypertensive heart disease without heart failure: Secondary | ICD-10-CM | POA: Diagnosis present

## 2018-06-13 DIAGNOSIS — I160 Hypertensive urgency: Secondary | ICD-10-CM

## 2018-06-13 DIAGNOSIS — I10 Essential (primary) hypertension: Secondary | ICD-10-CM

## 2018-06-13 HISTORY — DX: Hypertensive urgency: I16.0

## 2018-06-13 LAB — CBC
HCT: 42.2 % (ref 36.0–46.0)
Hemoglobin: 14.8 g/dL (ref 12.0–15.0)
MCH: 31.4 pg (ref 26.0–34.0)
MCHC: 35.1 g/dL (ref 30.0–36.0)
MCV: 89.4 fL (ref 80.0–100.0)
Platelets: 210 10*3/uL (ref 150–400)
RBC: 4.72 MIL/uL (ref 3.87–5.11)
RDW: 12.6 % (ref 11.5–15.5)
WBC: 13.3 10*3/uL — ABNORMAL HIGH (ref 4.0–10.5)
nRBC: 0 % (ref 0.0–0.2)

## 2018-06-13 LAB — BASIC METABOLIC PANEL
Anion gap: 10 (ref 5–15)
BUN: 10 mg/dL (ref 6–20)
CO2: 23 mmol/L (ref 22–32)
Calcium: 9.3 mg/dL (ref 8.9–10.3)
Chloride: 104 mmol/L (ref 98–111)
Creatinine, Ser: 1.05 mg/dL — ABNORMAL HIGH (ref 0.44–1.00)
GFR calc Af Amer: >60 mL/min (ref >60)
GFR calc non Af Amer: >60 mL/min (ref >60)
Glucose, Bld: 98 mg/dL (ref 70–99)
Potassium: 3.5 mmol/L (ref 3.5–5.1)
Sodium: 137 mmol/L (ref 135–145)

## 2018-06-13 LAB — HIV ANTIBODY (ROUTINE TESTING W REFLEX): HIV Screen 4th Generation wRfx: NONREACTIVE

## 2018-06-13 LAB — LIPID PANEL
Cholesterol: 249 mg/dL — ABNORMAL HIGH (ref 0–200)
HDL: 37 mg/dL — ABNORMAL LOW (ref >40)
LDL Cholesterol: 154 mg/dL — ABNORMAL HIGH (ref 0–99)
Total CHOL/HDL Ratio: 6.7 RATIO
Triglycerides: 288 mg/dL — ABNORMAL HIGH (ref <150)
VLDL: 58 mg/dL — ABNORMAL HIGH (ref 0–40)

## 2018-06-13 LAB — HEMOGLOBIN A1C
Hgb A1c MFr Bld: 5.6 % (ref 4.8–5.6)
Mean Plasma Glucose: 114.02 mg/dL

## 2018-06-13 LAB — TROPONIN I: Troponin I: 0.04 ng/mL (ref <0.03)

## 2018-06-13 MED ORDER — AMLODIPINE BESYLATE 10 MG PO TABS: 10.0000 mg | ORAL_TABLET | Freq: Every day | ORAL | 0 refills | Status: DC

## 2018-06-13 NOTE — TOC Transition Note (Signed)
Transition of Care [facility]) - CM/SW Discharge Note   Patient Details  Name: Katelyn [name] MRN: [id number] Date of Birth: 21-Jan-1975  Transition of Care [facility]) CM/SW Contact:  Carles Collet, RN Phone Number: 06/13/2018, 9:46 AM   Clinical Narrative:   Damaris Schooner w patient at bedside. She is from home with family. She is independent for ADLs. PCP is [facility]. She states that she has difficulty paying for meds at times. We discussed Good [employer], and she downloaded it on to her phone. We looked up her meds. We discussed the [employer] membership that would provide the lowest cost $3-6 for her medications. She  stated that she could afford the $36 yearly membership. Requested MD to print Rxs at DC so patient could go to pharmacy of choice using Good Rx app/ coupons. NO other CM needs identified. Patient did not express any other barriers to self care.     Final next level of care: Home/Self Care Barriers to Discharge: No Barriers Identified   Patient Goals and CMS Choice        Discharge Placement                       Discharge Plan and Services                                     Social Determinants of Health (SDOH) Interventions     Readmission Risk Interventions No flowsheet data found.

## 2018-06-13 NOTE — Progress Notes (Signed)
BP checked am-   BP was elevated.   Gave morning medications (including BP medications)  Will follow up in 1 hour to see if improved.    1030-  Rechecked BP with dinamap and manual BP still elevated.  Called MD- pt scheduled for discharge and IV is not working.    MD aware-  They said the 160/108 was fine.  No other medication needed at this time.  Concern for BP drop too low once lisinopril get into system.   Will follow up with patient in 2 days.  Encouraged patient to check BP 2x daily or if symptomatic.

## 2018-06-13 NOTE — Progress Notes (Signed)
Discussed discharge instruction including medications and follow up appointments.  Encouraged smoking cessation and to monitor BP 2 times daily

## 2018-06-13 NOTE — Discharge Summary (Signed)
[facility] Discharge Summary  Patient name: Katelyn [name] Medical record number: [id number] Date of birth: [birth year] Age: 43 y.o. Gender: female Date of Admission: 06/12/2018  Date of Discharge: 06/13/2018 Admitting Physician: Blane Ohara McDiarmid, MD  Primary Care Provider: Benay Pike, MD Consultants: None  Indication for Hospitalization: Hypertensive Crisis  Discharge Diagnoses/Problem List:  HTN HLD  Disposition: Home  Discharge Condition: Stable Discharge Exam:  General: NAD, pleasant, able to participate in exam Cardiac: RRR, normal heart sounds, no murmurs. 2+ radial and PT pulses bilaterally Respiratory: CTAB, normal effort, No wheezes, rales or rhonchi Abdomen: soft, nontender, nondistended, no hepatic or splenomegaly, +BS Extremities: no edema or cyanosis. WWP. Skin: warm and dry, no rashes noted Neuro: alert and oriented x4, no focal deficits Psych: Normal affect and mood   Brief Hospital Course:  Patient is a 43 yo female who presented with chest discomfort and shortness of breath and brief self resolved arm tingling and found to have a BP of 259/151 in the ED in the setting of medication non adherence. Patient was only taking lisinopril-HCTZ pill intermittently, could not afford the rest of her meds (coreg, spironolactone, and atorvastatin). She received labetalol and hydralazine during inpatient stay in addition to home meds. Patient had good response. Troponins trended flat (0.03, 0.03, 0.04) and chest pain and SOB resolved with improving BP. Lipid panel showed elevated LDL (154) and T cholesterol (249). UA was unremarkable and leukocytosis was down trended on recheck. Creatinine was also at baseline on discharge. Patient was stable on discharge with gradually down trending BP. She was started on Norvasc 10 mg in addition to lisinopril-HCTZ. Spironolactone and Coreg were discontinued. She has close follow up at [facility] on 6/9 for BP recheck and  BP medications titration.  Issues for Follow Up:  1. BP meds adherence, may need up additional agent on recheck 2. Assess affordability 3. Reinforce need to continue statin therapy, currently on atorvastatin 40 mg  4. Repeat CBC given mild leukocytosis in the hospital 5. Follow up on polyuria 6. Prolonged QTc, could repeat EKG  Significant Procedures: None  Significant Labs and Imaging:  Recent Labs  Lab 06/12/18 1500 06/13/18 0018  WBC 14.1* 13.3*  HGB 15.8* 14.8  HCT 45.3 42.2  PLT 209 210   Recent Labs  Lab 06/12/18 1500 06/13/18 0018  NA 139 137  K 3.7 3.5  CL 103 104  CO2 21* 23  GLUCOSE 109* 98  BUN 9 10  CREATININE 1.09* 1.05*  CALCIUM 9.4 9.3    Trop: 0.03>0.03>0.04  Results/Tests Pending at Time of Discharge: None  Discharge Medications:  Allergies as of 06/13/2018      Reactions   Metoprolol Nausea Only   Makes her feel "very sick" when taking med      Medication List    STOP taking these medications   carvedilol 6.25 MG tablet Commonly known as:  COREG   spironolactone 25 MG tablet Commonly known as:  ALDACTONE   traMADol 50 MG tablet Commonly known as:  ULTRAM     TAKE these medications   amLODipine 10 MG tablet Commonly known as:  NORVASC Take 1 tablet (10 mg total) by mouth daily.   atorvastatin 40 MG tablet Commonly known as:  LIPITOR Take 1 tablet (40 mg total) by mouth daily.   ibuprofen 200 MG tablet Commonly known as:  ADVIL Take 400 mg by mouth every 6 (six) hours as needed for headache or mild pain.   lisinopril-hydrochlorothiazide  20-12.5 MG tablet Commonly known as:  ZESTORETIC Take 2 tablets by mouth daily.   nicotine polacrilex 2 MG lozenge Commonly known as:  GNP Nicotine Polacrilex Take 1 lozenge (2 mg total) by mouth as needed for smoking cessation.       Discharge Instructions: Please refer to Patient Instructions section of EMR for full details.  Patient was counseled important signs and symptoms that  should prompt return to medical care, changes in medications, dietary instructions, activity restrictions, and follow up appointments.   Follow-Up Appointments:   Marjie Skiff, MD 06/13/2018, 9:41 AM PGY-3, [place]

## 2018-06-13 NOTE — Progress Notes (Signed)
Patient discharged from hospital today and does not have any Lipitor at home.  Already in PCPs box as pended order for refill.  Will leave for PCP to order as cannot cancel pended order.

## 2018-06-14 ENCOUNTER — Other Ambulatory Visit: Payer: Self-pay | Admitting: Family Medicine

## 2018-06-14 NOTE — Telephone Encounter (Signed)
Switched to 90 tabs for cost savings.

## 2018-06-15 ENCOUNTER — Ambulatory Visit (INDEPENDENT_AMBULATORY_CARE_PROVIDER_SITE_OTHER): Payer: Self-pay | Admitting: Family Medicine

## 2018-06-15 ENCOUNTER — Other Ambulatory Visit: Payer: Self-pay

## 2018-06-15 VITALS — BP 130/90 | HR 90 | Ht 68.0 in | Wt 185.1 lb

## 2018-06-15 DIAGNOSIS — D72828 Other elevated white blood cell count: Secondary | ICD-10-CM

## 2018-06-15 DIAGNOSIS — E785 Hyperlipidemia, unspecified: Secondary | ICD-10-CM

## 2018-06-15 DIAGNOSIS — I10 Essential (primary) hypertension: Secondary | ICD-10-CM

## 2018-06-15 DIAGNOSIS — D72829 Elevated white blood cell count, unspecified: Secondary | ICD-10-CM | POA: Insufficient documentation

## 2018-06-15 MED ORDER — ATORVASTATIN CALCIUM 40 MG PO TABS: 40.0000 mg | ORAL_TABLET | Freq: Every day | ORAL | 1 refills | Status: DC

## 2018-06-15 NOTE — Assessment & Plan Note (Signed)
She had a mild leukocytosis on the day of her hospital discharge.  She has no signs or symptoms of infection.  This is likely elevated due to stress.  Recheck CBC with differential today.

## 2018-06-15 NOTE — Progress Notes (Signed)
Subjective:  Katelyn [name] is a 43 y.o. female who presents to the [facility] today for hospital follow-up  HPI:  Patient was admitted to [facility] from 06/12/18 to 06/13/18 for hypertensive urgency in the setting of medication noncompliance due to financial issues.  She was restarted on amlodipine 10 mg and lisinopril-HCTZ 40-25 mg daily. She states that since being home she did have a slight headache the day after hospital discharge but has felt well since.  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  She has no lightheadedness, dizziness, chest pain, shortness of breath, palpitations, lower extremity edema. She checks her blood pressure regularly at home and while her systolic blood pressure has been under 140 she has noticed that that diastolic has been in the low 90s. She has not smoked in the last 3 days and voices high motivation to stay away from cigarettes in the future. She plans to start exercising by walking daily.  Patient states that she was not able to pick up her atorvastatin at her pharmacy as it was not available.  She would like to start this medication.  She is also interested in starting fish oil.  ROS: Per HPI   CC, SH/smoking status, and VS noted  Objective:  Physical Exam: BP 130/90   Pulse 90   Ht 5\' 8"  (1.727 m)   Wt 185 lb 2 oz (84 kg)   SpO2 98%   BMI 28.15 kg/m   Gen: NAD, resting comfortably CV: RRR with no murmurs appreciated Pulm: NWOB, CTAB with no crackles, wheezes, or rhonchi GI: Normal bowel sounds present. Soft, Nontender, Nondistended. MSK: no edema, cyanosis, or clubbing noted Skin: warm, dry  Neuro: grossly normal, moves all extremities Psych: Normal affect and thought content  Results for orders placed or performed during the hospital encounter of 06/12/18 (from the past 72 hour(s))  I-Stat beta hCG blood, ED     Status: None   Collection Time: 06/12/18  3:18 PM  Result Value Ref  Range   I-stat hCG, quantitative <5.0 <5 mIU/mL   Comment 3            Comment:   GEST. AGE      CONC.  (mIU/mL)   <=1 WEEK        5 - 50     2 WEEKS       50 - 500     3 WEEKS       100 - 10,000     4 WEEKS     1,000 - 30,000        FEMALE AND NON-PREGNANT FEMALE:     LESS THAN 5 mIU/mL   Troponin I - Now Then Q6H     Status: Abnormal   Collection Time: 06/12/18  6:49 PM  Result Value Ref Range   Troponin I 0.03 (HH) <0.03 ng/mL    Comment: CRITICAL VALUE NOTED.  VALUE IS CONSISTENT WITH PREVIOUSLY REPORTED AND CALLED VALUE. Performed at [facility], [place] [address] [place], [place] [address]   Urinalysis, Routine w reflex microscopic     Status: Abnormal   Collection Time: 06/12/18  8:18 PM  Result Value Ref Range   Color, Urine STRAW (A) YELLOW   APPearance CLEAR CLEAR   Specific Gravity, Urine 1.012 1.005 - 1.030   pH 6.0 5.0 - 8.0   Glucose, UA NEGATIVE NEGATIVE mg/dL   Hgb urine dipstick  NEGATIVE NEGATIVE   Bilirubin Urine NEGATIVE NEGATIVE   Ketones, ur NEGATIVE NEGATIVE mg/dL   Protein, ur NEGATIVE NEGATIVE mg/dL   Nitrite NEGATIVE NEGATIVE   Leukocytes,Ua NEGATIVE NEGATIVE    Comment: Performed at [facility] [address], [place], [place] [address]  HIV antibody (Routine Testing)     Status: None   Collection Time: 06/13/18 12:18 AM  Result Value Ref Range   HIV Screen 4th Generation wRfx Non Reactive Non Reactive    Comment: (NOTE) Performed At: [facility] [place], [place] [id number] Rush Farmer MD YJ:[id number]   CBC     Status: Abnormal   Collection Time: 06/13/18 12:18 AM  Result Value Ref Range   WBC 13.3 (H) 4.0 - 10.5 K/uL   RBC 4.72 3.87 - 5.11 MIL/uL   Hemoglobin 14.8 12.0 - 15.0 g/dL   HCT 42.2 36.0 - 46.0 %   MCV 89.4 80.0 - 100.0 fL   MCH 31.4 26.0 - 34.0 pg   MCHC 35.1 30.0 - 36.0 g/dL   RDW 12.6 11.5 - 15.5 %   Platelets 210 150 - 400 K/uL   nRBC 0.0 0.0 - 0.2 %    Comment: Performed at [facility], [place] [address], [place], [place] [address]  Basic metabolic panel     Status: Abnormal   Collection Time: 06/13/18 12:18 AM  Result Value Ref Range   Sodium 137 135 - 145 mmol/L   Potassium 3.5 3.5 - 5.1 mmol/L   Chloride 104 98 - 111 mmol/L   CO2 23 22 - 32 mmol/L   Glucose, Bld 98 70 - 99 mg/dL   BUN 10 6 - 20 mg/dL   Creatinine, Ser 1.05 (H) 0.44 - 1.00 mg/dL   Calcium 9.3 8.9 - 10.3 mg/dL   GFR calc non Af Amer >60 >60 mL/min   GFR calc Af Amer >60 >60 mL/min   Anion gap 10 5 - 15    Comment: Performed at [facility], [place] [address] [place], [place] [address]  Hemoglobin A1c     Status: None   Collection Time: 06/13/18 12:18 AM  Result Value Ref Range   Hgb A1c MFr Bld 5.6 4.8 - 5.6 %    Comment: (NOTE) Pre diabetes:          5.7%-6.4% Diabetes:              >6.4% Glycemic control for   <7.0% adults with diabetes    Mean Plasma Glucose 114.02 mg/dL    Comment: Performed at [facility] [address] [place], [place] [address]  Troponin I - Now Then Q6H     Status: Abnormal   Collection Time: 06/13/18 12:18 AM  Result Value Ref Range   Troponin I 0.04 (HH) <0.03 ng/mL    Comment: CRITICAL VALUE NOTED.  VALUE IS CONSISTENT WITH PREVIOUSLY REPORTED AND CALLED VALUE. Performed at [facility], [place] [address] [place],  [address]   Lipid panel     Status: Abnormal   Collection Time: 06/13/18 12:18 AM  Result Value Ref Range   Cholesterol 249 (H) 0 - 200 mg/dL   Triglycerides 288 (H) <150 mg/dL   HDL 37 (L) >40 mg/dL   Total CHOL/HDL Ratio 6.7 RATIO   VLDL 58 (H) 0 - 40 mg/dL   LDL Cholesterol 154 (H) 0 - 99 mg/dL    Comment:        Total  Cholesterol/HDL:CHD Risk Coronary Heart Disease Risk Table                     Men   Women  1/2 Average Risk   3.4   3.3  Average Risk       5.0   4.4  2 X Average Risk   9.6   7.1  3 X Average Risk  23.4   11.0        Use the calculated Patient Ratio above and the CHD Risk Table to  determine the patient's CHD Risk.        ATP III CLASSIFICATION (LDL):  <100     mg/dL   Optimal  100-129  mg/dL   Near or Above                    Optimal  130-159  mg/dL   Borderline  160-189  mg/dL   High  >190     mg/dL   Very High Performed at [facility] [address], [place], [place] [address]      Assessment/Plan:  Hypertension Doing well on amlodipine 10 mg daily, lisinopril- HCTZ 40-25 mg daily.  We can titrate up her medications further at this time.  Advised continued blood pressure monitoring at home with follow-up with PCP via telemedicine visit in 1 month.  Counseled on continued smoking cessation and regular exercise.  Patient is able to currently financially afford her medications however she will need orange card application.  Message sent to social worker for aid today.   Hyperlipidemia Patient informed of her lipid panel results.  Counseled on starting atorvastatin 40 mg daily.  Patient preferred a 30-day supply due to cost issues.  She was informed about the [employer] $4 list she continues to have financial issues.  Leukocytosis She had a mild leukocytosis on the day of her hospital discharge.  She has no signs or symptoms of infection.  This is likely elevated due to stress.  Recheck CBC with differential today.    Orders Placed This Encounter  Procedures  . CBC with Differential  . Ambulatory referral to Cardiology    Referral Priority:   Routine    Referral Type:   Consultation    Referral Reason:   Specialty Services Required    Requested Specialty:   Cardiology    Number of Visits Requested:   1    Meds ordered this encounter  Medications  . atorvastatin (LIPITOR) 40 MG tablet    Sig: Take 1 tablet (40 mg total) by mouth daily.    Dispense:  30 tablet    Refill:  [place], DO PGY-3, [facility] 06/15/2018 3:12 PM

## 2018-06-15 NOTE — Assessment & Plan Note (Addendum)
Doing well on amlodipine 10 mg daily, lisinopril- HCTZ 40-25 mg daily.  We can titrate up her medications further at this time.  Advised continued blood pressure monitoring at home with follow-up with PCP via telemedicine visit in 1 month.  Counseled on continued smoking cessation and regular exercise.  Patient is able to currently financially afford her medications however she will need orange card application.  Message sent to social worker for aid today.  Also referred to cardiology today.

## 2018-06-15 NOTE — Patient Instructions (Signed)
Please continue your current medications. You can keep checking your blood pressure at home and follow up with your doctor via telemedicine visit in 1 month.  We are checking some labs today. If results require attention, either myself or my nurse will get in touch with you. If everything is normal, you will get a letter in the mail or a message in My Chart. Please give [place] a call if you do not hear from [place] after 2 weeks.

## 2018-06-15 NOTE — Assessment & Plan Note (Signed)
Patient informed of her lipid panel results.  Counseled on starting atorvastatin 40 mg daily.  Patient preferred a 30-day supply due to cost issues.  She was informed about the [employer] $4 list she continues to have financial issues.

## 2018-06-16 LAB — CBC WITH DIFFERENTIAL/PLATELET
Basophils Absolute: 0.1 10*3/uL (ref 0.0–0.2)
Basos: 1 %
EOS (ABSOLUTE): 0.4 10*3/uL (ref 0.0–0.4)
Eos: 4 %
Hematocrit: 49.5 % — ABNORMAL HIGH (ref 34.0–46.6)
Hemoglobin: 17.2 g/dL — ABNORMAL HIGH (ref 11.1–15.9)
Immature Grans (Abs): 0 10*3/uL (ref 0.0–0.1)
Immature Granulocytes: 0 %
Lymphocytes Absolute: 2.7 10*3/uL (ref 0.7–3.1)
Lymphs: 23 %
MCH: 31.9 pg (ref 26.6–33.0)
MCHC: 34.7 g/dL (ref 31.5–35.7)
MCV: 92 fL (ref 79–97)
Monocytes Absolute: 1 10*3/uL — ABNORMAL HIGH (ref 0.1–0.9)
Monocytes: 9 %
Neutrophils Absolute: 7.5 10*3/uL — ABNORMAL HIGH (ref 1.4–7.0)
Neutrophils: 63 %
Platelets: 269 10*3/uL (ref 150–450)
RBC: 5.4 x10E6/uL — ABNORMAL HIGH (ref 3.77–5.28)
RDW: 13.2 % (ref 11.7–15.4)
WBC: 11.8 10*3/uL — ABNORMAL HIGH (ref 3.4–10.8)

## 2018-06-17 ENCOUNTER — Encounter: Payer: Self-pay | Admitting: Family Medicine

## 2018-06-18 ENCOUNTER — Telehealth: Payer: Self-pay | Admitting: Licensed Clinical Social Worker

## 2018-06-18 NOTE — Telephone Encounter (Signed)
Care Coordination  Telephone Outreach Note 06/18/2018  Type of Service: General Social Work Consult via phone  Name: Katelyn [name] MRN: [id number] DOB: [birth year]  Confirmed patient's name: Yes    Confirmed patient's date of birth  Yes   Katelyn [name] is a 43 y.o. female referred by Dr. Shawna Orleans for getting connected with the [facility] and for concerns with obtaining medications.  Review of patient's consultants notes from appropriate care team members was performed as part of care coordination referral.    LCSW called patient to assess needs and barriers. Patient reports having and taking all medications.  INTERVENTION:. Provided client with information about the [employer].   Plan:  Patient will  1. Pick up copy of the [employer] application from [facility] office 2. LCSW will also e-mail the application and instructions to mail to the [facility] office 3. Patient will call LCSW or [facility] if she has questions.  Dr. Shawna Orleans has been notified of the this outreach and plan.  Casimer Lanius, [facility]   [phone] 12:32 PM

## 2018-08-19 ENCOUNTER — Encounter (HOSPITAL_COMMUNITY): Payer: Self-pay

## 2018-08-19 ENCOUNTER — Ambulatory Visit (HOSPITAL_COMMUNITY)
Admission: RE | Admit: 2018-08-19 | Discharge: 2018-08-19 | Disposition: A | Discharge status: Home / Self Care | Payer: Self-pay | Source: Ambulatory Visit | Attending: Obstetrics and Gynecology | Admitting: Obstetrics and Gynecology

## 2018-08-19 ENCOUNTER — Other Ambulatory Visit: Payer: Self-pay

## 2018-08-19 ENCOUNTER — Ambulatory Visit
Admission: RE | Admit: 2018-08-19 | Discharge: 2018-08-19 | Disposition: A | Discharge status: Home / Self Care | Payer: No Typology Code available for payment source | Source: Ambulatory Visit | Attending: Obstetrics and Gynecology | Admitting: Obstetrics and Gynecology

## 2018-08-19 DIAGNOSIS — Z1239 Encounter for other screening for malignant neoplasm of breast: Secondary | ICD-10-CM | POA: Insufficient documentation

## 2018-08-19 DIAGNOSIS — Z01419 Encounter for gynecological examination (general) (routine) without abnormal findings: Secondary | ICD-10-CM | POA: Insufficient documentation

## 2018-08-19 DIAGNOSIS — Z1231 Encounter for screening mammogram for malignant neoplasm of breast: Secondary | ICD-10-CM

## 2018-08-19 NOTE — Addendum Note (Signed)
Encounter addended by: Loletta Parish, RN on: 08/19/2018 3:33 PM  Actions taken: Clinical Note Signed

## 2018-08-19 NOTE — Progress Notes (Addendum)
No complaints today.   Pap Smear: Pap smear completed today. Last Pap smear was 01/11/2015 at [facility] and [facility] with negative HPV. Per patient her last Pap smear is the only abnormal Pap smear she has had. Last Pap smear result is in Epic.  Physical exam: Breasts Breasts symmetrical. No skin abnormalities bilateral breasts. No nipple retraction bilateral breasts. No nipple discharge bilateral breasts. No lymphadenopathy. No lumps palpated bilateral breasts. No complaints of pain or tenderness on exam. Referred patient to the [facility] for a screening mammogram. Appointment scheduled for Thursday, August 19, 2018 at 1520.        Pelvic/Bimanual   Ext Genitalia No lesions, no swelling and no discharge observed on external genitalia.         Vagina Vagina pink and normal texture. No lesions or discharge observed in vagina.          Cervix Cervix is present. Cervix pink and of normal texture. No discharge observed.     Uterus Uterus is present and palpable. Uterus in normal position and normal size.        Adnexae Bilateral ovaries present and palpable. No tenderness on palpation.         Rectovaginal No rectal exam completed today since patient had no rectal complaints. No skin abnormalities observed on exam.    Smoking History: Patient is a former smoker that quit 06/12/2018.  Patient Navigation: Patient education provided. Access to services provided for patient through [facility] program.   Breast and Cervical Cancer Risk Assessment: Patient has no family history of breast cancer, known genetic mutations, or radiation treatment to the chest before age 24. Patient has no history of cervical dysplasia, immunocompromised, or DES exposure in-utero.  Risk Assessment    Risk Scores      08/19/2018   Last edited by: Armond Hang, LPN   5-year risk: 0.6 %   Lifetime risk: 7.3 %

## 2018-08-19 NOTE — Patient Instructions (Signed)
Explained breast self awareness with [name]. Let patient know that if today's Pap smear is normal that her next Pap smear is due in one year due to her previous Pap smear was abnormal. Referred patient to the [facility] for a screening mammogram. Appointment scheduled for Thursday, August 19, 2018 at 1520. Patient aware of appointment and will be there. Let patient know will follow up with her within the next couple weeks with results of Pap smear by letter or phone. Informed patient that the [facility] will follow-up with her within the next couple of weeks with results of mammogram by letter or phone. [name] verbalized understanding.  Zeeshan Korte, [facility], RN 3:26 PM

## 2018-08-23 LAB — CYTOLOGY - PAP
Diagnosis: NEGATIVE
HPV: NOT DETECTED

## 2018-08-25 ENCOUNTER — Encounter (HOSPITAL_COMMUNITY): Payer: Self-pay | Admitting: *Deleted

## 2018-08-25 NOTE — Progress Notes (Signed)
Letter mailed patient with negative pap smear. HPV was negative. Next pap smear due in one year.

## 2018-08-26 ENCOUNTER — Other Ambulatory Visit: Payer: Self-pay | Admitting: Family Medicine

## 2018-08-26 DIAGNOSIS — I10 Essential (primary) hypertension: Secondary | ICD-10-CM

## 2018-08-31 ENCOUNTER — Other Ambulatory Visit: Payer: Self-pay

## 2018-08-31 ENCOUNTER — Encounter: Payer: Self-pay | Admitting: Internal Medicine

## 2018-08-31 ENCOUNTER — Ambulatory Visit (INDEPENDENT_AMBULATORY_CARE_PROVIDER_SITE_OTHER): Payer: Self-pay | Admitting: Internal Medicine

## 2018-08-31 VITALS — BP 147/96 | HR 86 | Ht 68.0 in | Wt 185.0 lb

## 2018-08-31 DIAGNOSIS — E782 Mixed hyperlipidemia: Secondary | ICD-10-CM

## 2018-08-31 DIAGNOSIS — I10 Essential (primary) hypertension: Secondary | ICD-10-CM

## 2018-08-31 MED ORDER — LISINOPRIL 40 MG PO TABS: 40.0000 mg | ORAL_TABLET | Freq: Every day | ORAL | 3 refills | Status: DC

## 2018-08-31 MED ORDER — SPIRONOLACTONE 25 MG PO TABS: 12.5000 mg | ORAL_TABLET | Freq: Every day | ORAL | 3 refills | Status: DC

## 2018-08-31 NOTE — Progress Notes (Signed)
OFFICE CONSULT NOTE  Chief Complaint:  Uncontrolled hypertension  Primary Care Physician: Benay Pike, MD  HPI:  Katelyn [name] is a 43 y.o. female who is being seen today for the evaluation of uncontrolled hypertension at the request of Hensel, Jamal Collin, MD.  This is a 42 year old female who is followed in the [facility] health family medicine clinic for hypertension.  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  She reports intermittent noncompliance with medications.  She has a longstanding history of hypertension dating back to her first pregnancy 1995 at which time she says she was diagnosed with preeclampsia and is struggled with hypertension since then.  There is a strong family history of hypertension in her family including her twin brothers, her mother and her father all on blood pressure medications.  Her blood pressure is been considered difficult to control.  Recently she had an increase in her lisinopril HCTZ which was doubled to 2 tablets daily.  The dose was 20/12.5 mg/tab.  She says on the 2 tablet dose she felt unwell and it was attributed she thought due to overdiuresis.  She has since decreased the dose back to 20/12.5 mg daily.  Her blood pressure today was elevated at 147/96.  She says she has never seen a blood pressure around what would be considered normal of 120/80 or less.  She denies any chest pain or worsening shortness of breath.  Recently she had a lipid profile which showed an LDL of 154.  She was accordingly started on atorvastatin 40 mg daily which she is taking without any side effects.  PMHx:  Past Medical History:  Diagnosis Date  . Abdominal pain, RLQ 06/23/2014  . AKI (acute kidney injury) ([place]) 01/14/2015  . Anemia   . Anxiety   . Chest pain   . Depression   . Elevated LFTs 04/26/2012  . Hypertension   . Hypertensive urgency 06/13/2018  . Migraine   . Palpitations     Past Surgical History:  Procedure Laterality Date  . HYSTEROSCOPY W/  ENDOMETRIAL ABLATION  2010  . LAPAROSCOPY N/A 11/25/2014   Procedure: LAPAROSCOPY OPERATIVE WITH LEFT OVARIAN CYSTECTOMY;  Surgeon: Osborne Oman, MD;  Location: [facility];  Service: Gynecology;  Laterality: N/A;  . TUBAL LIGATION  2006    FAMHx:  Family History  Problem Relation Age of Onset  . Diabetes Mother   . Depression Mother   . Hypertension Mother   . Hypertension Father   . Hypertension Brother   . Hypertension Brother     SOCHx:   reports that she quit smoking about 2 months ago. Her smoking use included cigarettes. She started smoking about 24 years ago. She has a 1.30 pack-year smoking history. She has never used smokeless tobacco. She reports current alcohol use. She reports that she does not use drugs.  ALLERGIES:  Allergies  Allergen Reactions  . Metoprolol Nausea Only    Makes her feel "very sick" when taking med    ROS: Pertinent items noted in HPI and remainder of comprehensive ROS otherwise negative.  HOME MEDS: Current Outpatient Medications on File Prior to Visit  Medication Sig Dispense Refill  . amLODipine (NORVASC) 10 MG tablet Take 1 tablet (10 mg total) by mouth daily. 90 tablet 0  . atorvastatin (LIPITOR) 40 MG tablet Take 1 tablet (40 mg total) by mouth daily. 30 tablet 1  . ibuprofen (ADVIL) 200 MG tablet Take 400 mg by mouth every 6 (six) hours as  needed for headache or mild pain.     . nicotine polacrilex (GNP NICOTINE POLACRILEX) 2 MG lozenge Take 1 lozenge (2 mg total) by mouth as needed for smoking cessation. 100 tablet 0  . [DISCONTINUED] citalopram (CELEXA) 10 MG tablet Take 1 tablet (10 mg total) by mouth daily. 30 tablet 2  . [DISCONTINUED] omeprazole (PRILOSEC) 20 MG capsule Take 1 capsule (20 mg total) by mouth daily. 30 capsule 2  . [DISCONTINUED] potassium chloride SA (K-DUR,KLOR-CON) 20 MEQ tablet Take 1 tablet (20 mEq total) by mouth daily. For 7 days 7 tablet 0   No current facility-administered medications on file prior to visit.      LABS/IMAGING: No results found for this or any previous visit (from the past 48 hour(s)). No results found.  LIPID PANEL:    Component Value Date/Time   CHOL 249 (H) 06/13/2018 0018   TRIG 288 (H) 06/13/2018 0018   HDL 37 (L) 06/13/2018 0018   CHOLHDL 6.7 06/13/2018 0018   VLDL 58 (H) 06/13/2018 0018   LDLCALC 154 (H) 06/13/2018 0018    WEIGHTS: Wt Readings from Last 3 Encounters:  08/31/18 185 lb (83.9 kg)  08/19/18 183 lb (83 kg)  06/15/18 185 lb 2 oz (84 kg)    VITALS: BP (!) 147/96   Pulse 86   Ht 5\' 8"  (1.727 m)   Wt 185 lb (83.9 kg)   SpO2 100%   BMI 28.13 kg/m   EXAM: General appearance: alert and no distress Neck: no carotid bruit, no JVD and thyroid not enlarged, symmetric, no tenderness/mass/nodules Lungs: clear to auscultation bilaterally Heart: regular rate and rhythm, S1, S2 normal, no murmur, click, rub or gallop Abdomen: soft, non-tender; bowel sounds normal; no masses,  no organomegaly Extremities: extremities normal, atraumatic, no cyanosis or edema Pulses: 2+ and symmetric Skin: Skin color, texture, turgor normal. No rashes or lesions Neurologic: Grossly normal Psych: Pleasant  EKG: Deferred  ASSESSMENT: 1. Uncontrolled hypertension 2. Dyslipidemia  PLAN: 1.   Ms. Corena Pilgrim has had uncontrolled hypertension and recent admission for malignant hypertension.  She was not tolerant of double dose of lisinopril HCTZ which caused her excessive urination and she did not feel well.  I do think she benefit from a higher dose of lisinopril would recommend increasing that to 40 mg daily.  We will discontinue the HCTZ component and replace it with Aldactone 12.5 mg daily.  She will continue on amlodipine.  I have encouraged her to follow blood pressures at home and bring readings back to a follow-up appointment with me in 4 to 6 weeks.  Thanks again for the kind referral.  Pixie Casino, MD, FACC, [place]  Director of the Advanced Lipid Disorders &  Cardiovascular Risk Reduction Clinic Diplomate of the American Board of Clinical Lipidology Attending Cardiologist  Direct Dial: [phone]  Fax: [phone]  Website:  [email or web address] 08/31/2018, 1:35 PM

## 2018-08-31 NOTE — Patient Instructions (Signed)
Medication Instructions:  STOP lisinopril-hctz START lisinopril 40mg  daily START spironolactone 12.5mg  daily Continue other medications If you need a refill on your cardiac medications before your next appointment, please call your pharmacy.     Follow-Up: At [facility], you and your health needs are our priority.  As part of our continuing mission to provide you with exceptional heart care, we have created designated Provider Care Teams.  These Care Teams include your primary Cardiologist (physician) and Advanced Practice Providers (APPs -  Physician Assistants and Nurse Practitioners) who all work together to provide you with the care you need, when you need it. You will need a follow up appointment in 4 weeks in the office. You may see Dr. Debara Pickett or one of the following Advanced Practice Providers on your designated Care Team: Almyra Deforest, [place] . Fabian Sharp, PA-C  Any Other Special Instructions Will Be Listed Below (If Applicable).

## 2018-09-01 ENCOUNTER — Telehealth: Payer: Self-pay

## 2018-09-01 NOTE — Telephone Encounter (Signed)
Left message for patient about rescheduling [facility] appointment. Left name and number for her to call back.

## 2018-09-02 ENCOUNTER — Telehealth: Payer: Self-pay

## 2018-09-02 NOTE — Telephone Encounter (Signed)
Left message with patient about rescheduling [facility] appointment. Left name and number for her to call back.

## 2018-09-06 ENCOUNTER — Ambulatory Visit: Payer: Self-pay

## 2018-09-07 ENCOUNTER — Other Ambulatory Visit: Payer: Self-pay

## 2018-09-07 MED ORDER — AMLODIPINE BESYLATE 10 MG PO TABS: 10.0000 mg | ORAL_TABLET | Freq: Every day | ORAL | 0 refills | Status: DC

## 2018-09-17 ENCOUNTER — Telehealth (HOSPITAL_COMMUNITY): Payer: Self-pay | Admitting: *Deleted

## 2018-09-17 NOTE — Telephone Encounter (Signed)
Normal Pap smear and negative HPV result letter mailed to patient by Cytology.

## 2018-09-27 ENCOUNTER — Other Ambulatory Visit: Payer: Self-pay

## 2018-09-27 ENCOUNTER — Ambulatory Visit (INDEPENDENT_AMBULATORY_CARE_PROVIDER_SITE_OTHER): Payer: Self-pay | Admitting: Medical

## 2018-09-27 ENCOUNTER — Encounter: Payer: Self-pay | Admitting: Medical

## 2018-09-27 VITALS — BP 140/98 | HR 71 | Temp 97.3°F | Ht 67.0 in | Wt 178.2 lb

## 2018-09-27 DIAGNOSIS — I10 Essential (primary) hypertension: Secondary | ICD-10-CM

## 2018-09-27 DIAGNOSIS — E782 Mixed hyperlipidemia: Secondary | ICD-10-CM

## 2018-09-27 MED ORDER — SPIRONOLACTONE 25 MG PO TABS: 25.0000 mg | ORAL_TABLET | Freq: Every day | ORAL | 6 refills | Status: DC

## 2018-09-27 MED ORDER — SPIRONOLACTONE 25 MG PO TABS: 25.0000 mg | ORAL_TABLET | Freq: Every day | ORAL | 3 refills | Status: DC

## 2018-09-27 NOTE — Progress Notes (Signed)
Cardiology Office Note   Date:  09/27/2018   ID:  Katelyn [name], DOB Sep 22, 1975, MRN [id number]  PCP:  Benay Pike, MD  Cardiologist:  Pixie Casino, MD EP: None  No chief complaint on file.     History of Present Illness: Katelyn [name] is a 43 y.o. female with PMH of difficult to control HTN and HLD, who presents for follow-up of her HTN.  She was last evaluated by cardiology at an outpatient visit with Dr. Debara Pickett 08/31/2018 to establish care for management of her HTN after a recent admission to [facility] 06/2018 for malignant HTN. She reportedly has struggled with her blood pressure since her first pregnancy in 1995 which was c/b preeclampsia. She has a strong family history of HTN in her mother, father, and twin brothers all being on BP medications. She was intolerant to high dose lisinopril-HCTZ due to overdiuresis. At her visit with Dr. Debara Pickett, her BP was  147/96 at that time. She was started on spironolactone 12.5mg  daily in leu of HCTZ and continued on lisinopril with increased dose of 40mg  daily. Echo in 2018 with EF 50-55%, G1DD, no RWMA, and no significant valvular abnormalities.   She returns today for follow-up of her blood pressure. BP has typically been in the 130s-140s/90s since her last visit. No complaints of chest pain, SOB, LE edema, dizziness, lightheadedness, headaches, or syncope. She reports being heavy handed with seasoning when she cooks and knows she eats more salt than recommended. We discussed the importance of maintaining a low sodium diet to improve her blood pressure. She works in a [employer] in a fairly strenuous job, easily accomplishing 30 minutes of physical activity a day.    Past Medical History:  Diagnosis Date  . Abdominal pain, RLQ 06/23/2014  . AKI (acute kidney injury) ([name]) 01/14/2015  . Anemia   . Anxiety   . Chest pain   . Depression   . Elevated LFTs 04/26/2012  . Hypertension   . Hypertensive urgency 06/13/2018  . Migraine   .  Palpitations     Past Surgical History:  Procedure Laterality Date  . HYSTEROSCOPY W/ ENDOMETRIAL ABLATION  2010  . LAPAROSCOPY N/A 11/25/2014   Procedure: LAPAROSCOPY OPERATIVE WITH LEFT OVARIAN CYSTECTOMY;  Surgeon: Osborne Oman, MD;  Location: [facility];  Service: Gynecology;  Laterality: N/A;  . TUBAL LIGATION  2006     Current Outpatient Medications  Medication Sig Dispense Refill  . amLODipine (NORVASC) 10 MG tablet Take 1 tablet (10 mg total) by mouth daily. 90 tablet 0  . atorvastatin (LIPITOR) 40 MG tablet Take 1 tablet (40 mg total) by mouth daily. 30 tablet 1  . ibuprofen (ADVIL) 200 MG tablet Take 400 mg by mouth every 6 (six) hours as needed for headache or mild pain.     Marland Kitchen lisinopril (ZESTRIL) 40 MG tablet Take 1 tablet (40 mg total) by mouth daily. 90 tablet 3  . nicotine polacrilex (GNP NICOTINE POLACRILEX) 2 MG lozenge Take 1 lozenge (2 mg total) by mouth as needed for smoking cessation. 100 tablet 0  . spironolactone (ALDACTONE) 25 MG tablet Take 1 tablet (25 mg total) by mouth daily. 30 tablet 6   No current facility-administered medications for this visit.     Allergies:   Metoprolol    Social History:  The patient  reports that she quit smoking about 3 months ago. Her smoking use included cigarettes. She started smoking about 24 years ago. She has a  1.30 pack-year smoking history. She has never used smokeless tobacco. She reports current alcohol use. She reports that she does not use drugs.   Family History:  The patient's family history includes Depression in her mother; Diabetes in her mother; Hypertension in her brother, brother, father, and mother.    ROS:  Please see the history of present illness.   Otherwise, review of systems are positive for none.   All other systems are reviewed and negative.    PHYSICAL EXAM: VS:  BP (!) 140/98   Pulse 71   Temp (!) 97.3 F (36.3 C)   Ht 5\' 7"  (1.702 m)   Wt 178 lb 4 oz (80.9 kg)   BMI 27.92 kg/m  , BMI Body  mass index is 27.92 kg/m. GEN: Well nourished, well developed, in no acute distress HEENT: sclera anicteric Neck: no JVD, carotid bruits, or masses Cardiac: RRR; no murmurs, rubs, or gallops, no edema  Respiratory:  clear to auscultation bilaterally, normal work of breathing GI: soft, nontender, nondistended, + BS MS: no deformity or atrophy Skin: warm and dry, no rash Neuro:  Strength and sensation are intact Psych: euthymic mood, full affect   EKG:  EKG is ordered today. The ekg ordered today demonstrates NSR, rate 71, no STE/D, no TWI   Recent Labs: 06/13/2018: BUN 10; Creatinine, Ser 1.05; Potassium 3.5; Sodium 137 06/15/2018: Hemoglobin 17.2; Platelets 269    Lipid Panel    Component Value Date/Time   CHOL 249 (H) 06/13/2018 0018   TRIG 288 (H) 06/13/2018 0018   HDL 37 (L) 06/13/2018 0018   CHOLHDL 6.7 06/13/2018 0018   VLDL 58 (H) 06/13/2018 0018   LDLCALC 154 (H) 06/13/2018 0018      Wt Readings from Last 3 Encounters:  09/27/18 178 lb 4 oz (80.9 kg)  08/31/18 185 lb (83.9 kg)  08/19/18 183 lb (83 kg)      Other studies Reviewed: Additional studies/ records that were reviewed today include:   Echocardiogram 2018: - Left ventricle: The cavity size was normal. There was severe concentric hypertrophy. Systolic function was normal. The estimated ejection fraction was in the range of 50% to 55%. Wall motion was normal; there were no regional wall motion   abnormalities. Doppler parameters are consistent with abnormal left ventricular relaxation (grade 1 diastolic dysfunction).   Doppler parameters are consistent with high ventricular filling pressure. - Aortic valve: Transvalvular velocity was within the normal range.   There was no stenosis. There was no regurgitation. - Mitral valve: Transvalvular velocity was within the normal range.   There was no evidence for stenosis. There was trivial regurgitation. - Right ventricle: The cavity size was normal. Wall  thickness was   normal. Systolic function was normal. - Tricuspid valve: There was no regurgitation.    ASSESSMENT AND PLAN:  1. HTN: BP 140/98 today; persistently above goal over the past month, though overall improved from her last visit.  - Will increase spironolactone to 25mg  daily - Continue amlodipine and lisinopril  - If BP persistently above goal could consider further increase in spironolactone vs additional agent - has prior intolerance to metoprolol; could trial alternative BBlocker or hydralazine - Recommended a low sodium diet   2. HLD: LDL 154 06/2018. She was started on atorvastatin  - Plan to check FLP and LFTs for medication monitoring - Continue atrovastatin   Current medicines are reviewed at length with the patient today.  The patient does not have concerns regarding medicines.  The following  changes have been made:  Will increase spironolactone to 25mg  daily  Labs/ tests ordered today include:   Orders Placed This Encounter  Procedures  . Lipid panel  . Hepatic function panel     Disposition:   FU with pharmacy in 1 month for HTN management and Dr. Debara Pickett in 6 months  Signed, Abigail Butts, PA-C  09/27/2018 4:20 PM

## 2018-09-27 NOTE — Patient Instructions (Addendum)
Medication Instructions:  Increase Spironolactone 25 mg daily Continue all other medications If you need a refill on your cardiac medications before your next appointment, please call your pharmacy.   Lab work: Fasting Lipid and Hepatic panels   Lab order enclosed   Testing/Procedures: None ordered  Follow-Up: At [facility], you and your health needs are our priority.  As part of our continuing mission to provide you with exceptional heart care, we have created designated Provider Care Teams.  These Care Teams include your primary Cardiologist (physician) and Advanced Practice Providers (APPs -  Physician Assistants and Nurse Practitioners) who all work together to provide you with the care you need, when you need it. . Schedule appointment with Pharmacist in blood pressure clinic in 1 month . Schedule appointment with Dr.Hilty in 6 months    Call 3 months to schedule .   Follow Low salt diet  Guidelines given  Keep a log of Blood Pressures and bring readings to next appointment

## 2018-10-28 ENCOUNTER — Other Ambulatory Visit: Payer: Self-pay

## 2018-10-28 ENCOUNTER — Ambulatory Visit (INDEPENDENT_AMBULATORY_CARE_PROVIDER_SITE_OTHER): Payer: Self-pay | Admitting: Pharmacist

## 2018-10-28 VITALS — BP 138/86 | HR 76 | Wt 179.0 lb

## 2018-10-28 DIAGNOSIS — I10 Essential (primary) hypertension: Secondary | ICD-10-CM

## 2018-10-28 DIAGNOSIS — E782 Mixed hyperlipidemia: Secondary | ICD-10-CM

## 2018-10-28 MED ORDER — CHLORTHALIDONE 25 MG PO TABS: 12.5000 mg | ORAL_TABLET | Freq: Every day | ORAL | 1 refills | Status: DC

## 2018-10-28 NOTE — Patient Instructions (Signed)
Return for a  follow up appointment in 4-5 weeks  Go to the lab in [place] your blood pressure at home daily (if able) and keep record of the readings.  Take your BP meds as follows: *Start taking chlorthalidone 12.5mg  daily if blood work good*  Bring all of your meds, your BP cuff and your record of home blood pressures to your next appointment.  Exercise as you're able, try to walk approximately 30 minutes per day.  Keep salt intake to a minimum, especially watch canned and prepared boxed foods.  Eat more fresh fruits and vegetables and fewer canned items.  Avoid eating in fast food restaurants.    HOW TO TAKE YOUR BLOOD PRESSURE: . Rest 5 minutes before taking your blood pressure. .  Don't smoke or drink caffeinated beverages for at least 30 minutes before. . Take your blood pressure before (not after) you eat. . Sit comfortably with your back supported and both feet on the floor (don't cross your legs). . Elevate your arm to heart level on a table or a desk. . Use the proper sized cuff. It should fit smoothly and snugly around your bare upper arm. There should be enough room to slip a fingertip under the cuff. The bottom edge of the cuff should be 1 inch above the crease of the elbow. . Ideally, take 3 measurements at one sitting and record the average.

## 2018-10-29 LAB — BASIC METABOLIC PANEL
BUN/Creatinine Ratio: 11 (ref 9–23)
BUN: 11 mg/dL (ref 6–24)
CO2: 26 mmol/L (ref 20–29)
Calcium: 9.9 mg/dL (ref 8.7–10.2)
Chloride: 103 mmol/L (ref 96–106)
Creatinine, Ser: 1.04 mg/dL — ABNORMAL HIGH (ref 0.57–1.00)
GFR calc Af Amer: 76 mL/min/{1.73_m2} (ref >59)
GFR calc non Af Amer: 66 mL/min/{1.73_m2} (ref >59)
Glucose: 106 mg/dL — ABNORMAL HIGH (ref 65–99)
Potassium: 4.5 mmol/L (ref 3.5–5.2)
Sodium: 142 mmol/L (ref 134–144)

## 2018-11-01 ENCOUNTER — Encounter: Payer: Self-pay | Admitting: Pharmacist

## 2018-11-01 NOTE — Assessment & Plan Note (Signed)
Blood pressure still above goal. Plan to start chlorthalidone 12.5mg  daily but will obtain BMP 1st to assess renal function and electrolytes. She is to monitor BP twice daily and bring log for follow up in 4 weeks.

## 2018-11-01 NOTE — Progress Notes (Signed)
Patient ID: Katelyn [name]                 DOB: [birth year]                      MRN: [id number]     HPI:  Katelyn [name] is a 43 y.o. female referred by Dr. Debara Pickett to HTN clinic. PMH includes uncontrolled HTN ,preeclamsia, and hyperlipidemia. Noted recent ED admission on June/2020 for malignant hypertension. Patient stopped taking her BP medication for few weeks and it was the cause for the extremely elevated BP . She reports compliance with her current regimen and denies headaches, chest pan, swelling, increase fatigue or dizziness. Noted patient is on lisinopril and spironolactone but no BMET done in the last 30 days.   Patient presents to HTN clinic in good spirit and very excited about her wedding in 2 weeks. She is willing to start diuretic if needed, and is working on positive lifestyle modifications.   Current HTN meds:  Amlodipine 10mg  daily Lisinopril 40mg  daily Spironolactone 25mg  daily  Previously tried:  HCTZ - stopped d/t increase diuresis  BP goal: 130/80 or less  Family History: Mom: HTN, DM, GOUT, DAD: HTN, Twin brothers both have HTN  Social History: stopped smoking   Diet: Lunch - [place] and cheese sandwich, soup broccoli, when cooking it usually is salmon, barbecue chicken, chicken alfredo, bake spaghetti, Dinner - is the same as lunch. Snacks: breakfast bar, fruits (apples, banana, fruits)  Exercise: activities of daily living  Home BP readings: none available  Wt Readings from Last 3 Encounters:  10/28/18 179 lb (81.2 kg)  09/27/18 178 lb 4 oz (80.9 kg)  08/31/18 185 lb (83.9 kg)   BP Readings from Last 3 Encounters:  10/28/18 138/86  09/27/18 (!) 140/98  08/31/18 (!) 147/96   Pulse Readings from Last 3 Encounters:  10/28/18 76  09/27/18 71  08/31/18 86    Renal function: Estimated Creatinine Clearance: 75.6 mL/min (A) (by C-G formula based on SCr of 1.04 mg/dL (H)).  Past Medical History:  Diagnosis Date  . Abdominal pain, RLQ 06/23/2014  . AKI  (acute kidney injury) ([place]) 01/14/2015  . Anemia   . Anxiety   . Chest pain   . Depression   . Elevated LFTs 04/26/2012  . Hypertension   . Hypertensive urgency 06/13/2018  . Migraine   . Palpitations     Current Outpatient Medications on File Prior to Visit  Medication Sig Dispense Refill  . amLODipine (NORVASC) 10 MG tablet Take 1 tablet (10 mg total) by mouth daily. 90 tablet 0  . atorvastatin (LIPITOR) 40 MG tablet Take 1 tablet (40 mg total) by mouth daily. 30 tablet 1  . ibuprofen (ADVIL) 200 MG tablet Take 400 mg by mouth every 6 (six) hours as needed for headache or mild pain.     Marland Kitchen lisinopril (ZESTRIL) 40 MG tablet Take 1 tablet (40 mg total) by mouth daily. 90 tablet 3  . nicotine polacrilex (GNP NICOTINE POLACRILEX) 2 MG lozenge Take 1 lozenge (2 mg total) by mouth as needed for smoking cessation. 100 tablet 0  . spironolactone (ALDACTONE) 25 MG tablet Take 1 tablet (25 mg total) by mouth daily. 90 tablet 3  . [DISCONTINUED] citalopram (CELEXA) 10 MG tablet Take 1 tablet (10 mg total) by mouth daily. 30 tablet 2  . [DISCONTINUED] omeprazole (PRILOSEC) 20 MG capsule Take 1 capsule (20 mg total) by mouth daily. 30 capsule 2  . [  DISCONTINUED] potassium chloride SA (K-DUR,KLOR-CON) 20 MEQ tablet Take 1 tablet (20 mEq total) by mouth daily. For 7 days 7 tablet 0   No current facility-administered medications on file prior to visit.     Allergies  Allergen Reactions  . Metoprolol Nausea Only    Makes her feel "very sick" when taking med    Blood pressure 138/86, pulse 76, weight 179 lb (81.2 kg), SpO2 99 %.  Hypertension Blood pressure still above goal. Plan to start chlorthalidone 12.5mg  daily but will obtain BMP 1st to assess renal function and electrolytes. She is to monitor BP twice daily and bring log for follow up in 4 weeks.    Teshaun Olarte Rodriguez-Guzman PharmD, BCPS, [place] [id number] 11/01/2018 5:03 PM

## 2018-12-03 ENCOUNTER — Other Ambulatory Visit: Payer: Self-pay | Admitting: Family Medicine

## 2018-12-09 ENCOUNTER — Ambulatory Visit (INDEPENDENT_AMBULATORY_CARE_PROVIDER_SITE_OTHER): Payer: Self-pay | Admitting: Pharmacist Clinician (PhC)/ Clinical Pharmacy Specialist

## 2018-12-09 ENCOUNTER — Other Ambulatory Visit: Payer: Self-pay

## 2018-12-09 VITALS — BP 124/76 | Resp 14 | Ht 67.0 in | Wt 178.0 lb

## 2018-12-09 DIAGNOSIS — I10 Essential (primary) hypertension: Secondary | ICD-10-CM

## 2018-12-09 NOTE — Patient Instructions (Signed)
Your blood pressure today is 124/76  Check your blood pressure at home daily (if able) and keep record of the readings.  Take your BP meds as follows:  Continue with all current medications  Bring all of your meds, your BP cuff and your record of home blood pressures to your next appointment.  Exercise as you're able, try to walk approximately 30 minutes per day.  Keep salt intake to a minimum, especially watch canned and prepared boxed foods.  Eat more fresh fruits and vegetables and fewer canned items.  Avoid eating in fast food restaurants.    HOW TO TAKE YOUR BLOOD PRESSURE: . Rest 5 minutes before taking your blood pressure. .  Don't smoke or drink caffeinated beverages for at least 30 minutes before. . Take your blood pressure before (not after) you eat. . Sit comfortably with your back supported and both feet on the floor (don't cross your legs). . Elevate your arm to heart level on a table or a desk. . Use the proper sized cuff. It should fit smoothly and snugly around your bare upper arm. There should be enough room to slip a fingertip under the cuff. The bottom edge of the cuff should be 1 inch above the crease of the elbow. . Ideally, take 3 measurements at one sitting and record the average.

## 2018-12-10 ENCOUNTER — Encounter: Payer: Self-pay | Admitting: Pharmacist Clinician (PhC)/ Clinical Pharmacy Specialist

## 2018-12-10 NOTE — Progress Notes (Signed)
S: CC: Hypertension HPI: Katelyn [name] is a 43YO female referred to the HTN clinic by Dr Debara Pickett. PMH includes HTN, preeclampsia and HLD. She presented to the HTN clinic for the first time about a month ago and was willing to start a diuretic if needed. She was also working on positive lifestyle modifications.  Today she presents for follow-up. Pt states she is feeling well and has no complaints. She is relieved to see her blood pressure under 130 consistently and is feeling much better. She recently got married [place] courthouse and is excited to have a big party to celebrate once COVID is more controlled.  Her BP today was 124/76  FH: Father has hx of HTN; Mother has hx of gout, HTN, DM; twin brothers both with HTN SH: Quit smoking recently and seems to be happy about that change. Drinks wine on the weekends and the amount is variable depending on how the week went. Primarily drinks green tea and water, no soda and drinks 2 cups of coffee daily Diet: Enjoys chicken and fish. She usually cooks at home and does add a little salt while cooking, but none at the table. Eats vegetables every day and prefers fresh (sometimes frozen). 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 She has talked with her husband about walking around the neighborhood and they are considering starting to do that. O: PMH:  HLD Last lipids TC 249, HDL 37, LDL 154 and Trigs 288; atorvastatin  HTN Hx of preeclampsia, last BP 138/86; amlodipine, chlorthalidone, lisinopril, spironolactone   Meds: amlodipine 10mg  PO daily, atorvastatin 40mg  PO daily, chlorthalidone 12.5mg  PO daily, ibuprofen 200mg  2 tablets PO every 6 hours PRN for headache or mild pain, lisinopril 40mg  PO daily, nicotine polacrilex 2mg  lozenge PO daily PRN smoking cessation, spironolactone 25mg  PO daily ALL: Metoprolol (makes her "very sick") VS:  Wt Readings from Last 3  Encounters: 10/28/18  179 lb (81.2 kg) 09/27/18  178 lb 4 oz (80.9 kg) 08/31/18  185 lb (83.9 kg)  BP Readings from Last 3 Encounters: 10/28/18  138/86 09/27/18  140/98 08/31/18  147/96  Pulse Readings from Last 3 Encounters: 10/28/18  76 09/27/18  71 08/31/18  86  BP Goal: less than 130/80 Labs:  A: 1. Controlled hypertension, currently taking amlodipine 10mg  daily, chlorthalidone 12.5mg  daily, lisinopril 40mg  daily and spironolactone 25mg  daily P: 1. Will continue BP medications as prescribed. Discussed continuing to record her BP 3-4 times a week and was instructed to call if her BP was trending >140/80. Discussed ways to get out and exercise more. Pt will contact us if issues arrive. Can follow up after next physician visit if needed.  Orbie Pyo, Pharm D Candidate  [employer], Class of 2021  I was with student and patient for entire interview and agree with the above assessment and plan.   Tommy Medal PharmD CPP [facility] [name] [place], [place] [address]

## 2018-12-25 ENCOUNTER — Other Ambulatory Visit: Payer: Self-pay | Admitting: Internal Medicine

## 2019-01-31 ENCOUNTER — Ambulatory Visit: Payer: No Typology Code available for payment source | Attending: Internal Medicine

## 2019-01-31 DIAGNOSIS — Z20822 Contact with and (suspected) exposure to covid-19: Secondary | ICD-10-CM | POA: Insufficient documentation

## 2019-02-01 LAB — NOVEL CORONAVIRUS, NAA: SARS-CoV-2, NAA: NOT DETECTED

## 2019-02-15 ENCOUNTER — Other Ambulatory Visit: Payer: Self-pay | Admitting: *Deleted

## 2019-02-15 DIAGNOSIS — E785 Hyperlipidemia, unspecified: Secondary | ICD-10-CM

## 2019-02-15 MED ORDER — ATORVASTATIN CALCIUM 40 MG PO TABS: 40.0000 mg | ORAL_TABLET | Freq: Every day | ORAL | 1 refills | Status: DC

## 2019-02-24 ENCOUNTER — Other Ambulatory Visit: Payer: Self-pay | Admitting: Family Medicine

## 2019-02-25 ENCOUNTER — Other Ambulatory Visit: Payer: Self-pay | Admitting: Family Medicine

## 2019-02-25 MED ORDER — AMLODIPINE BESYLATE 10 MG PO TABS: 10.0000 mg | ORAL_TABLET | Freq: Every day | ORAL | 1 refills | Status: DC

## 2019-03-25 ENCOUNTER — Other Ambulatory Visit: Payer: Self-pay | Admitting: Internal Medicine

## 2019-04-04 ENCOUNTER — Other Ambulatory Visit: Payer: Self-pay

## 2019-04-04 ENCOUNTER — Ambulatory Visit (HOSPITAL_COMMUNITY)
Admission: EM | Admit: 2019-04-04 | Discharge: 2019-04-04 | Disposition: A | Discharge status: Home / Self Care | Payer: Self-pay | Attending: Family Medicine | Admitting: Family Medicine

## 2019-04-04 ENCOUNTER — Encounter (HOSPITAL_COMMUNITY): Payer: Self-pay

## 2019-04-04 DIAGNOSIS — R002 Palpitations: Secondary | ICD-10-CM

## 2019-04-04 DIAGNOSIS — R42 Dizziness and giddiness: Secondary | ICD-10-CM

## 2019-04-04 NOTE — ED Provider Notes (Signed)
[place]    CSN: [id number] Arrival date & time: 04/04/19  [id number]      History   Chief Complaint Chief Complaint  Patient presents with  . Dizziness  . Nausea    HPI Tralana Gettelfinger is a 44 y.o. female.   Patient is a 44 year old female past medical history of anemia, anxiety, chest pain, elevated LFTs, hypertension, hypertensive urgency, palpitations.  She presents today with palpitations, dizziness, lightheadedness and nausea that started this morning at work.  Reporting she was more tired than normal and drink a cup of coffee and large red bull.  Symptoms started after this.  Like her heart was palpitating and she felt weird.  Does not typically drink this much caffeine.  Currently her symptoms have mostly resolved and she is feeling much better.  Denies any current chest pain, shortness of breath, nausea, abdominal pain, dizziness or lightheadedness.  She is having intermittent palpitations here and there.  ROS per HPI      Past Medical History:  Diagnosis Date  . Abdominal pain, RLQ 06/23/2014  . AKI (acute kidney injury) ([place]) 01/14/2015  . Anemia   . Anxiety   . Chest pain   . Depression   . Elevated LFTs 04/26/2012  . Hypertension   . Hypertensive urgency 06/13/2018  . Migraine   . Palpitations     Patient Active Problem List   Diagnosis Date Noted  . Well woman exam with routine gynecological exam 08/19/2018  . Leukocytosis 06/15/2018  . Hypertensive left ventricular hypertrophy 06/13/2018  . Hypertension 06/12/2018  . Hyperlipidemia   . Generalized headaches 06/16/2016  . Breast lump in upper outer quadrant 12/28/2014  . Ovarian torsion 11/25/2014  . Snoring 06/23/2014  . Migraine headache without aura 07/29/2012  . GAD (generalized anxiety disorder) 03/20/2011  . Panic disorder with agoraphobia and moderate panic attacks 02/26/2009  . CIGARETTE SMOKER 11/24/2008  . Resistant hypertension 03/05/2006    Past Surgical History:  Procedure  Laterality Date  . HYSTEROSCOPY W/ ENDOMETRIAL ABLATION  2010  . LAPAROSCOPY N/A 11/25/2014   Procedure: LAPAROSCOPY OPERATIVE WITH LEFT OVARIAN CYSTECTOMY;  Surgeon: Osborne Oman, MD;  Location: [facility];  Service: Gynecology;  Laterality: N/A;  . TUBAL LIGATION  2006    OB History    Gravida  6   Para  3   Term  3   Preterm      AB  3   Living  3     SAB      TAB  3   Ectopic      Multiple      Live Births  3            Home Medications    Prior to Admission medications   Medication Sig Start Date End Date Taking? Authorizing Provider  amLODipine (NORVASC) 10 MG tablet Take 1 tablet (10 mg total) by mouth daily. 02/25/19 08/24/19  Benay Pike, MD  atorvastatin (LIPITOR) 40 MG tablet Take 1 tablet (40 mg total) by mouth daily. 02/15/19   Benay Pike, MD  chlorthalidone (HYGROTON) 25 MG tablet TAKE HALF A TABLET BY MOUTH DAILY 03/25/19   Hilty, Nadean Corwin, MD  ibuprofen (ADVIL) 200 MG tablet Take 400 mg by mouth every 6 (six) hours as needed for headache or mild pain.     [provider]  lisinopril (ZESTRIL) 40 MG tablet Take 1 tablet (40 mg total) by mouth daily. 08/31/18 11/29/18  Pixie Casino, MD  spironolactone (ALDACTONE) 25 MG tablet Take 1 tablet (25 mg total) by mouth daily. 09/27/18   Kroeger, Lorelee Cover., PA-C  citalopram (CELEXA) 10 MG tablet Take 1 tablet (10 mg total) by mouth daily. 09/12/10 03/20/11  Gregor Hams, MD  omeprazole (PRILOSEC) 20 MG capsule Take 1 capsule (20 mg total) by mouth daily. 07/25/10 03/20/11  Gregor Hams, MD  potassium chloride SA (K-DUR,KLOR-CON) 20 MEQ tablet Take 1 tablet (20 mEq total) by mouth daily. For 7 days 08/21/10 03/20/11  Katherina Mires, MD    Family History Family History  Problem Relation Age of Onset  . Diabetes Mother   . Depression Mother   . Hypertension Mother   . Hypertension Father   . Hypertension Brother   . Hypertension Brother     Social History Social History   Tobacco Use  .  Smoking status: Former Smoker    Packs/day: 0.10    Years: 13.00    Pack years: 1.30    Types: Cigarettes    Start date: 01/06/1994    Quit date: 06/12/2018    Years since quitting: 0.8  . Smokeless tobacco: Never Used  . Tobacco comment: smoking  hx Max 1 PPD; trying to quit, 06/2016, quit for 7 years  Substance Use Topics  . Alcohol use: Yes    Alcohol/week: 0.0 standard drinks    Comment: occassionally  . Drug use: No     Allergies   Metoprolol   Review of Systems Review of Systems   Physical Exam Triage Vital Signs ED Triage Vitals  Enc Vitals Group     BP 04/04/19 0942 131/88     Pulse Rate 04/04/19 0942 83     Resp 04/04/19 0942 18     Temp 04/04/19 0942 98.1 F (36.7 C)     Temp Source 04/04/19 0942 Oral     SpO2 04/04/19 0942 100 %     Weight --      Height --      Head Circumference --      Peak Flow --      Pain Score 04/04/19 0940 0     Pain Loc --      Pain Edu? --      Excl. in [place]? --    No data found.  Updated Vital Signs BP 131/88 (BP Location: Left Arm)   Pulse 83   Temp 98.1 F (36.7 C) (Oral)   Resp 18   SpO2 100%   Visual Acuity Right Eye Distance:   Left Eye Distance:   Bilateral Distance:    Right Eye Near:   Left Eye Near:    Bilateral Near:     Physical Exam Vitals and nursing note reviewed.  Constitutional:      General: She is not in acute distress.    Appearance: Normal appearance. She is not ill-appearing, toxic-appearing or diaphoretic.  HENT:     Head: Normocephalic.     Nose: Nose normal.  Eyes:     Conjunctiva/sclera: Conjunctivae normal.  Cardiovascular:     Rate and Rhythm: Normal rate and regular rhythm.  Pulmonary:     Effort: Pulmonary effort is normal.     Breath sounds: Normal breath sounds.  Musculoskeletal:        General: Normal range of motion.     Cervical back: Normal range of motion.  Skin:    General: Skin is warm and dry.     Findings: No rash.  Neurological:  Mental Status: She is  alert.  Psychiatric:        Mood and Affect: Mood normal.      UC Treatments / Results  Labs (all labs ordered are listed, but only abnormal results are displayed) Labs Reviewed - No data to display  EKG   Radiology No results found.  Procedures Procedures (including critical care time)  Medications Ordered in UC Medications - No data to display  Initial Impression / Assessment and Plan / UC Course  I have reviewed the triage vital signs and the nursing notes.  Pertinent labs & imaging results that were available during my care of the patient were reviewed by me and considered in my medical decision making (see chart for details).     Palpitations and dizziness most likely due to excess caffeine. Her symptoms have started to resolve and she is feeling much better. Recommended decrease caffeine intake, drink plenty of water. If her symptoms return despite this she will need to go to the ER for further management. EKG with normal sinus rhythm and normal rate at this time. Vital signs normal and she is nontoxic appearing No concern for ACS Final Clinical Impressions(s) / UC Diagnoses   Final diagnoses:  Palpitations  Dizziness     Discharge Instructions     I believe that most of your symptoms are related to the caffeine intake this morning. You should start feeling better throughout the day.  Make sure drinking plenty of water.  Eat small meals. Decrease or limit caffeine Follow up as needed for continued or worsening symptoms      ED Prescriptions    None     PDMP not reviewed this encounter.   Loura Halt A, NP 04/04/19 1031

## 2019-04-04 NOTE — ED Triage Notes (Signed)
Pt reports she started feeling lightheaded, nausea and abdominal pain this morning at work. Pt is concern for her BP 140/108.

## 2019-04-04 NOTE — Discharge Instructions (Signed)
I believe that most of your symptoms are related to the caffeine intake this morning. You should start feeling better throughout the day.  Make sure drinking plenty of water.  Eat small meals. Decrease or limit caffeine Follow up as needed for continued or worsening symptoms

## 2019-06-23 ENCOUNTER — Other Ambulatory Visit: Payer: Self-pay | Admitting: Internal Medicine

## 2019-07-25 ENCOUNTER — Emergency Department (HOSPITAL_COMMUNITY)
Admission: EM | Admit: 2019-07-25 | Discharge: 2019-07-25 | Disposition: A | Discharge status: Home / Self Care | Payer: No Typology Code available for payment source | Attending: Emergency Medicine | Admitting: Emergency Medicine

## 2019-07-25 ENCOUNTER — Encounter (HOSPITAL_COMMUNITY): Payer: Self-pay | Admitting: Emergency Medicine

## 2019-07-25 ENCOUNTER — Other Ambulatory Visit: Payer: Self-pay

## 2019-07-25 DIAGNOSIS — I10 Essential (primary) hypertension: Secondary | ICD-10-CM

## 2019-07-25 DIAGNOSIS — Z87891 Personal history of nicotine dependence: Secondary | ICD-10-CM | POA: Insufficient documentation

## 2019-07-25 DIAGNOSIS — Z79899 Other long term (current) drug therapy: Secondary | ICD-10-CM | POA: Insufficient documentation

## 2019-07-25 LAB — COMPREHENSIVE METABOLIC PANEL
ALT: 31 U/L (ref 0–44)
AST: 34 U/L (ref 15–41)
Albumin: 3.9 g/dL (ref 3.5–5.0)
Alkaline Phosphatase: 76 U/L (ref 38–126)
Anion gap: 9 (ref 5–15)
BUN: 8 mg/dL (ref 6–20)
CO2: 21 mmol/L — ABNORMAL LOW (ref 22–32)
Calcium: 8.9 mg/dL (ref 8.9–10.3)
Chloride: 107 mmol/L (ref 98–111)
Creatinine, Ser: 1.03 mg/dL — ABNORMAL HIGH (ref 0.44–1.00)
GFR calc Af Amer: >60 mL/min (ref >60)
GFR calc non Af Amer: >60 mL/min (ref >60)
Glucose, Bld: 108 mg/dL — ABNORMAL HIGH (ref 70–99)
Potassium: 4.2 mmol/L (ref 3.5–5.1)
Sodium: 137 mmol/L (ref 135–145)
Total Bilirubin: 0.5 mg/dL (ref 0.3–1.2)
Total Protein: 6.6 g/dL (ref 6.5–8.1)

## 2019-07-25 LAB — CBC WITH DIFFERENTIAL/PLATELET
Abs Immature Granulocytes: 0.03 10*3/uL (ref 0.00–0.07)
Basophils Absolute: 0.1 10*3/uL (ref 0.0–0.1)
Basophils Relative: 1 %
Eosinophils Absolute: 0.5 10*3/uL (ref 0.0–0.5)
Eosinophils Relative: 4 %
HCT: 40.2 % (ref 36.0–46.0)
Hemoglobin: 14.3 g/dL (ref 12.0–15.0)
Immature Granulocytes: 0 %
Lymphocytes Relative: 16 %
Lymphs Abs: 1.9 10*3/uL (ref 0.7–4.0)
MCH: 32.6 pg (ref 26.0–34.0)
MCHC: 35.6 g/dL (ref 30.0–36.0)
MCV: 91.6 fL (ref 80.0–100.0)
Monocytes Absolute: 0.7 10*3/uL (ref 0.1–1.0)
Monocytes Relative: 6 %
Neutro Abs: 8.9 10*3/uL — ABNORMAL HIGH (ref 1.7–7.7)
Neutrophils Relative %: 73 %
Platelets: 236 10*3/uL (ref 150–400)
RBC: 4.39 MIL/uL (ref 3.87–5.11)
RDW: 12 % (ref 11.5–15.5)
WBC: 12.1 10*3/uL — ABNORMAL HIGH (ref 4.0–10.5)
nRBC: 0 % (ref 0.0–0.2)

## 2019-07-25 MED ORDER — LISINOPRIL 20 MG PO TABS
40.0000 mg | ORAL_TABLET | Freq: Once | ORAL | Status: AC
  Administered 2019-07-25: 40 mg via ORAL
  Filled 2019-07-25: qty 2

## 2019-07-25 MED ORDER — ACETAMINOPHEN 325 MG PO TABS
650.0000 mg | ORAL_TABLET | Freq: Once | ORAL | Status: AC
  Administered 2019-07-25: 650 mg via ORAL
  Filled 2019-07-25: qty 2

## 2019-07-25 MED ORDER — LISINOPRIL 40 MG PO TABS
40.0000 mg | ORAL_TABLET | Freq: Every day | ORAL | 0 refills | Status: DC
Start: 2019-07-25 — End: 2020-01-06

## 2019-07-25 MED ORDER — CHLORTHALIDONE 25 MG PO TABS
12.5000 mg | ORAL_TABLET | Freq: Every day | ORAL | 0 refills | Status: DC
Start: 2019-07-25 — End: 2020-04-06

## 2019-07-25 NOTE — ED Notes (Signed)
Pt given food and beverage per EDP order 

## 2019-07-25 NOTE — ED Triage Notes (Signed)
Pt. Stated, Katelyn [name] had a headache since yesterday, Katelyn [name] been out of my med. For over a week.

## 2019-07-25 NOTE — ED Provider Notes (Signed)
[facility] EMERGENCY DEPARTMENT Provider Note   CSN: [id number] Arrival date & time: 07/25/19  3614     History Chief Complaint  Patient presents with  . Headache  . Hypertension    Katelyn [name] is a 44 y.o. female with PMH significant for HTN and migraines presents the ED with a 1 day history of generalized headache.  Patient states that this is her typical hypertension headache and that she has had it many times before.  She reports that she has not seen her primary care provider at [place] or her cardiology team at [facility] in "too long".  She regularly takes four antihypertensives, but has only had two of them in the past week.  She states that she had forgotten to refill her medications because she had recently gone to [place] with her husband to celebrate his 49th birthday.  After leaving church yesterday morning, she developed her headache symptoms, but did not want to go to the pharmacy.  She reports that she has refills waiting for her, but she was not going to get paid until Wednesday and she did not want to borrow money from her husband.  She readily admits that her poorly controlled hypertension is due to her not wanting to take her medications when she is feeling well and instead taking them when she becomes symptomatic of her high blood pressure.  She understands that this is not a sustainable long-term plan and states that she will be better with compliance moving forward.  Patient denies any recent illness or infection, fevers or chills, neck pain or stiffness, chest pain or difficulty breathing, back pain, nausea, blurred vision, numbness or weakness, or other neurologic deficits   HPI     Past Medical History:  Diagnosis Date  . Abdominal pain, RLQ 06/23/2014  . AKI (acute kidney injury) ([place]) 01/14/2015  . Anemia   . Anxiety   . Chest pain   . Depression   . Elevated LFTs 04/26/2012  . Hypertension   . Hypertensive urgency  06/13/2018  . Migraine   . Palpitations     Patient Active Problem List   Diagnosis Date Noted  . Well woman exam with routine gynecological exam 08/19/2018  . Leukocytosis 06/15/2018  . Hypertensive left ventricular hypertrophy 06/13/2018  . Hypertension 06/12/2018  . Hyperlipidemia   . Generalized headaches 06/16/2016  . Breast lump in upper outer quadrant 12/28/2014  . Ovarian torsion 11/25/2014  . Snoring 06/23/2014  . Migraine headache without aura 07/29/2012  . GAD (generalized anxiety disorder) 03/20/2011  . Panic disorder with agoraphobia and moderate panic attacks 02/26/2009  . CIGARETTE SMOKER 11/24/2008  . Resistant hypertension 03/05/2006    Past Surgical History:  Procedure Laterality Date  . HYSTEROSCOPY W/ ENDOMETRIAL ABLATION  2010  . LAPAROSCOPY N/A 11/25/2014   Procedure: LAPAROSCOPY OPERATIVE WITH LEFT OVARIAN CYSTECTOMY;  Surgeon: Osborne Oman, MD;  Location: [facility];  Service: Gynecology;  Laterality: N/A;  . TUBAL LIGATION  2006     OB History    Gravida  6   Para  3   Term  3   Preterm      AB  3   Living  3     SAB      TAB  3   Ectopic      Multiple      Live Births  3           Family History  Problem Relation  Age of Onset  . Diabetes Mother   . Depression Mother   . Hypertension Mother   . Hypertension Father   . Hypertension Brother   . Hypertension Brother     Social History   Tobacco Use  . Smoking status: Former Smoker    Packs/day: 0.10    Years: 13.00    Pack years: 1.30    Types: Cigarettes    Start date: 01/06/1994    Quit date: 06/12/2018    Years since quitting: 1.1  . Smokeless tobacco: Never Used  . Tobacco comment: smoking  hx Max 1 PPD; trying to quit, 06/2016, quit for 7 years  Vaping Use  . Vaping Use: Never used  Substance Use Topics  . Alcohol use: Yes    Alcohol/week: 0.0 standard drinks    Comment: occassionally  . Drug use: No    Home Medications Prior to Admission medications     Medication Sig Start Date End Date Taking? Authorizing Provider  amLODipine (NORVASC) 10 MG tablet Take 1 tablet (10 mg total) by mouth daily. 02/25/19 08/24/19 Yes Benay Pike, MD  atorvastatin (LIPITOR) 40 MG tablet Take 1 tablet (40 mg total) by mouth daily. 02/15/19  Yes Benay Pike, MD  ibuprofen (ADVIL) 200 MG tablet Take 400 mg by mouth every 6 (six) hours as needed for fever, headache or mild pain.    Yes [provider]  spironolactone (ALDACTONE) 25 MG tablet Take 1 tablet (25 mg total) by mouth daily. 09/27/18  Yes Kroeger, Daleen Snook M., PA-C  chlorthalidone (HYGROTON) 25 MG tablet Take 0.5 tablets (12.5 mg total) by mouth daily. 07/25/19   Corena Herter, PA-C  lisinopril (ZESTRIL) 40 MG tablet Take 1 tablet (40 mg total) by mouth daily. 07/25/19 08/24/19  Corena Herter, PA-C  citalopram (CELEXA) 10 MG tablet Take 1 tablet (10 mg total) by mouth daily. 09/12/10 03/20/11  Gregor Hams, MD  omeprazole (PRILOSEC) 20 MG capsule Take 1 capsule (20 mg total) by mouth daily. 07/25/10 03/20/11  Gregor Hams, MD  potassium chloride SA (K-DUR,KLOR-CON) 20 MEQ tablet Take 1 tablet (20 mEq total) by mouth daily. For 7 days 08/21/10 03/20/11  Katherina Mires, MD    Allergies    Metoprolol  Review of Systems   Review of Systems  All other systems reviewed and are negative.   Physical Exam Updated Vital Signs BP (!) 157/114   Pulse 71   Temp 98 F (36.7 C) (Oral)   Resp 16   SpO2 97%   Physical Exam Vitals and nursing note reviewed. Exam conducted with a chaperone present.  Constitutional:      Appearance: Normal appearance.  HENT:     Head: Normocephalic and atraumatic.  Eyes:     General: No scleral icterus.    Extraocular Movements: Extraocular movements intact.     Conjunctiva/sclera: Conjunctivae normal.     Pupils: Pupils are equal, round, and reactive to light.  Cardiovascular:     Rate and Rhythm: Normal rate and regular rhythm.     Pulses: Normal pulses.      Heart sounds: Normal heart sounds.  Pulmonary:     Effort: Pulmonary effort is normal. No respiratory distress.     Breath sounds: Normal breath sounds.  Musculoskeletal:     Cervical back: Normal range of motion and neck supple. No rigidity.  Skin:    General: Skin is dry.     Capillary Refill: Capillary refill takes less than 2  seconds.  Neurological:     Mental Status: She is alert.     GCS: GCS eye subscore is 4. GCS verbal subscore is 5. GCS motor subscore is 6.     Comments: CN II to XII grossly intact.  Ambulates without ataxia or difficulty.  No numbness or weakness.  Negative Romberg testing.  Can move all extremities.  Strength intact throughout.  PERRL and EOM intact.  No nystagmus.  Negative Brudzinski test.  Psychiatric:        Mood and Affect: Mood normal.        Behavior: Behavior normal.        Thought Content: Thought content normal.     ED Results / Procedures / Treatments   Labs (all labs ordered are listed, but only abnormal results are displayed) Labs Reviewed  CBC WITH DIFFERENTIAL/PLATELET - Abnormal; Notable for the following components:      Result Value   WBC 12.1 (*)    Neutro Abs 8.9 (*)    All other components within normal limits  COMPREHENSIVE METABOLIC PANEL - Abnormal; Notable for the following components:   CO2 21 (*)    Glucose, Bld 108 (*)    Creatinine, Ser 1.03 (*)    All other components within normal limits    EKG None  Radiology No results found.  Procedures Procedures (including critical care time)  Medications Ordered in ED Medications  lisinopril (ZESTRIL) tablet 40 mg (40 mg Oral Given 07/25/19 1420)  acetaminophen (TYLENOL) tablet 650 mg (650 mg Oral Given 07/25/19 1420)    ED Course  I have reviewed the triage vital signs and the nursing notes.  Pertinent labs & imaging results that were available during my care of the patient were reviewed by me and considered in my medical decision making (see chart for details).     MDM Rules/Calculators/A&P                          Labs CBC with differential: Mild leukocytosis of 12.1, consistent with patient's baseline.  No anemia or other derangement. CMP: Unremarkable.  Patient presents to the ED with symptomatic high blood pressure with clinical manifestation of headache.  She states that her headache is consistent with her prior episodes of hypertensive headache.  She admits that her elevated blood pressures are due to noncompliance and has not been taking her lisinopril or chlorthalidone in weeks.  She has been continuing to take her spironolactone and amlodipine.  She states that she does not require prescription here in the ED, however her headache symptoms were too bothersome that she did not want to go to the pharmacy today and instead wanted to be evaluated here.  She also cites finances as a reason for her noncompliance.  I personally spoke with Gilda Crease and office will call patient about scheduling an appointment for follow-up given her symptomatic high blood pressure.    On subsequent evaluation, patient's blood pressure has improved significantly and is now 121/90.  She states that her headache has entirely resolved with administration of the 40 mg of lisinopril and 60 mg Tylenol.  She now states that she is not entirely sure whether or not her blood pressure medications have been called into the pharmacy.  We will provide patient with a 2-week prescription of antihypertensives to bridge until she can be seen by her cardiology or primary care group.    All of the evaluation and work-up results were discussed with  the patient and any family at bedside.  Patient and/or family were informed that while patient is appropriate for discharge at this time, some medical emergencies may only develop or become detectable after a period of time.  I specifically instructed patient and/or family to return to return to the ED or seek immediate medical attention for any new  or worsening symptoms.  They were provided opportunity to ask any additional questions and have none at this time.  Prior to discharge patient is feeling well, agreeable with plan for discharge home.  They have expressed understanding of verbal discharge instructions as well as return precautions and are agreeable to the plan.    Final Clinical Impression(s) / ED Diagnoses Final diagnoses:  Essential hypertension    Rx / DC Orders ED Discharge Orders         Ordered    lisinopril (ZESTRIL) 40 MG tablet  Daily     Discontinue  Reprint     07/25/19 1526    chlorthalidone (HYGROTON) 25 MG tablet  Daily     Discontinue  Reprint     07/25/19 1526           Corena Herter, PA-C 07/25/19 1527    Carmin Muskrat, MD 07/25/19 1558

## 2019-07-25 NOTE — Discharge Instructions (Addendum)
Please take your medications, as directed.  You will need to follow-up with your primary care provider and/or cardiology group in the next couple of weeks for ongoing evaluation and management of your high blood pressure.  Return to the ED or seek immediate medical attention should you experience any new or worsening symptoms.

## 2019-07-25 NOTE — ED Notes (Signed)
Patient verbalizes understanding of discharge instructions. Opportunity for questioning and answers were provided. Pt discharged from ED. 

## 2019-09-08 ENCOUNTER — Other Ambulatory Visit: Payer: Self-pay | Admitting: Internal Medicine

## 2020-01-06 ENCOUNTER — Emergency Department (HOSPITAL_COMMUNITY)
Admission: EM | Admit: 2020-01-06 | Discharge: 2020-01-06 | Disposition: A | Discharge status: Home / Self Care | Payer: Medicaid Other | Attending: Emergency Medicine | Admitting: Emergency Medicine

## 2020-01-06 ENCOUNTER — Emergency Department (HOSPITAL_COMMUNITY): Payer: Medicaid Other

## 2020-01-06 ENCOUNTER — Other Ambulatory Visit: Payer: Self-pay | Admitting: Family Medicine

## 2020-01-06 ENCOUNTER — Other Ambulatory Visit: Payer: Self-pay

## 2020-01-06 DIAGNOSIS — Z87891 Personal history of nicotine dependence: Secondary | ICD-10-CM | POA: Insufficient documentation

## 2020-01-06 DIAGNOSIS — I160 Hypertensive urgency: Secondary | ICD-10-CM

## 2020-01-06 DIAGNOSIS — E785 Hyperlipidemia, unspecified: Secondary | ICD-10-CM

## 2020-01-06 DIAGNOSIS — I10 Essential (primary) hypertension: Secondary | ICD-10-CM | POA: Insufficient documentation

## 2020-01-06 LAB — CBC
HCT: 45.5 % (ref 36.0–46.0)
Hemoglobin: 15.6 g/dL — ABNORMAL HIGH (ref 12.0–15.0)
MCH: 30.8 pg (ref 26.0–34.0)
MCHC: 34.3 g/dL (ref 30.0–36.0)
MCV: 89.9 fL (ref 80.0–100.0)
Platelets: 243 10*3/uL (ref 150–400)
RBC: 5.06 MIL/uL (ref 3.87–5.11)
RDW: 11.9 % (ref 11.5–15.5)
WBC: 11.6 10*3/uL — ABNORMAL HIGH (ref 4.0–10.5)
nRBC: 0 % (ref 0.0–0.2)

## 2020-01-06 LAB — BASIC METABOLIC PANEL
Anion gap: 14 (ref 5–15)
BUN: 10 mg/dL (ref 6–20)
CO2: 24 mmol/L (ref 22–32)
Calcium: 9.7 mg/dL (ref 8.9–10.3)
Chloride: 104 mmol/L (ref 98–111)
Creatinine, Ser: 0.96 mg/dL (ref 0.44–1.00)
GFR, Estimated: >60 mL/min (ref >60)
Glucose, Bld: 88 mg/dL (ref 70–99)
Potassium: 3.1 mmol/L — ABNORMAL LOW (ref 3.5–5.1)
Sodium: 142 mmol/L (ref 135–145)

## 2020-01-06 LAB — TROPONIN I (HIGH SENSITIVITY): Troponin I (High Sensitivity): 16 ng/L (ref <18)

## 2020-01-06 MED ORDER — LISINOPRIL 40 MG PO TABS
40.0000 mg | ORAL_TABLET | Freq: Every day | ORAL | 0 refills | Status: DC
Start: 2020-01-06 — End: 2020-04-06

## 2020-01-06 MED ORDER — AMLODIPINE BESYLATE 10 MG PO TABS
10.0000 mg | ORAL_TABLET | Freq: Every day | ORAL | 0 refills | Status: DC
Start: 2020-01-06 — End: 2020-04-06

## 2020-01-06 MED ORDER — LISINOPRIL 20 MG PO TABS
40.0000 mg | ORAL_TABLET | Freq: Once | ORAL | Status: AC
  Administered 2020-01-06: 40 mg via ORAL
  Filled 2020-01-06: qty 2

## 2020-01-06 MED ORDER — AMLODIPINE BESYLATE 5 MG PO TABS
10.0000 mg | ORAL_TABLET | Freq: Once | ORAL | Status: AC
  Administered 2020-01-06: 10 mg via ORAL
  Filled 2020-01-06: qty 2

## 2020-01-06 MED ORDER — POTASSIUM CHLORIDE CRYS ER 20 MEQ PO TBCR
50.0000 meq | EXTENDED_RELEASE_TABLET | Freq: Once | ORAL | Status: AC
  Administered 2020-01-06: 50 meq via ORAL
  Filled 2020-01-06: qty 2

## 2020-01-06 MED ORDER — LORAZEPAM 1 MG PO TABS
1.0000 mg | ORAL_TABLET | Freq: Once | ORAL | Status: AC
  Administered 2020-01-06: 1 mg via ORAL
  Filled 2020-01-06: qty 1

## 2020-01-06 NOTE — ED Triage Notes (Signed)
Pt with hx of hypertension, has not taken her rx for same in over two weeks. Pt feels anxious, thinks it is her blood pressure. Systolic bp >200 in triage.

## 2020-01-06 NOTE — ED Notes (Signed)
Discharge instructions provided to patient. Verbalized understanding. Alert and oriented. IV lock removed. Ambulated with steady gait out of ED with family.

## 2020-01-06 NOTE — Discharge Instructions (Addendum)
Like we discussed, I have given you a short prescription for your lisinopril as well as your amlodipine.  You need to still follow-up with [facility] and discuss your blood pressure as well as your medications.  Please be sure that you follow-up with your cardiologist as well.  If your symptoms worsen once again, please return to the emergency department.  It was a pleasure to meet you.

## 2020-01-06 NOTE — ED Provider Notes (Signed)
[facility] EMERGENCY DEPARTMENT Provider Note   CSN: [id number] Arrival date & time: 01/06/20  1543     History Chief Complaint  Patient presents with  . Hypertension    Katelyn [name] is a 44 y.o. female.  HPI Patient is a 44 year old female with a medical history as noted below.  She takes lisinopril, chlorthalidone, spironolactone, as well as amlodipine.  She states that she is followed at [facility] and fills her prescriptions there.  She ran out of her medications about 2 weeks ago.  She went in today but only to have her prescriptions refilled, the chlorthalidone as well as the spironolactone.  She states that she was in the car and began feeling very anxious.  She felt as if her heart was pounding.  She felt short of breath.  She states that she experiences the symptoms when her blood pressure is unmanaged.  She came to the emergency department for evaluation.  She took a dose of chlorthalidone as well as spironolactone upon arrival.  She denies any chest pain or chest tightness.  She notes some mild shortness of breath at this time.  No fevers, chills, abdominal pain.    Past Medical History:  Diagnosis Date  . Abdominal pain, RLQ 06/23/2014  . AKI (acute kidney injury) (HCC) 01/14/2015  . Anemia   . Anxiety   . Chest pain   . Depression   . Elevated LFTs 04/26/2012  . Hypertension   . Hypertensive urgency 06/13/2018  . Migraine   . Palpitations     Patient Active Problem List   Diagnosis Date Noted  . Well woman exam with routine gynecological exam 08/19/2018  . Leukocytosis 06/15/2018  . Hypertensive left ventricular hypertrophy 06/13/2018  . Hypertension 06/12/2018  . Hyperlipidemia   . Generalized headaches 06/16/2016  . Breast lump in upper outer quadrant 12/28/2014  . Ovarian torsion 11/25/2014  . Snoring 06/23/2014  . Migraine headache without aura 07/29/2012  . GAD (generalized anxiety disorder) 03/20/2011  .  Panic disorder with agoraphobia and moderate panic attacks 02/26/2009  . CIGARETTE SMOKER 11/24/2008  . Resistant hypertension 03/05/2006    Past Surgical History:  Procedure Laterality Date  . HYSTEROSCOPY W/ ENDOMETRIAL ABLATION  2010  . LAPAROSCOPY N/A 11/25/2014   Procedure: LAPAROSCOPY OPERATIVE WITH LEFT OVARIAN CYSTECTOMY;  Surgeon: Tereso Newcomer, MD;  Location: [facility];  Service: Gynecology;  Laterality: N/A;  . TUBAL LIGATION  2006     OB History    Gravida  6   Para  3   Term  3   Preterm      AB  3   Living  3     SAB      IAB  3   Ectopic      Multiple      Live Births  3           Family History  Problem Relation Age of Onset  . Diabetes Mother   . Depression Mother   . Hypertension Mother   . Hypertension Father   . Hypertension Brother   . Hypertension Brother     Social History   Tobacco Use  . Smoking status: Former Smoker    Packs/day: 0.10    Years: 13.00    Pack years: 1.30    Types: Cigarettes    Start date: 01/06/1994    Quit date: 06/12/2018    Years since quitting: 1.5  . Smokeless tobacco: Never  Used  . Tobacco comment: smoking  hx Max 1 PPD; trying to quit, 06/2016, quit for 7 years  Vaping Use  . Vaping Use: Never used  Substance Use Topics  . Alcohol use: Yes    Alcohol/week: 0.0 standard drinks    Comment: occassionally  . Drug use: No    Home Medications Prior to Admission medications   Medication Sig Start Date End Date Taking? Authorizing Provider  amLODipine (NORVASC) 10 MG tablet Take 1 tablet (10 mg total) by mouth daily. 02/25/19 08/24/19 Yes Benay Pike, MD  Aspirin-Salicylamide-Caffeine [facility] HEADACHE POWDER PO) Take 1 packet by mouth daily as needed (headache).   Yes [provider]  atorvastatin (LIPITOR) 40 MG tablet Take 1 tablet (40 mg total) by mouth daily. 02/15/19  Yes Benay Pike, MD  Calcium Carbonate Antacid (ALKA-SELTZER ANTACID PO) Take 1 tablet by mouth daily as needed (stomach  acid).   Yes [provider]  chlorthalidone (HYGROTON) 25 MG tablet Take 0.5 tablets (12.5 mg total) by mouth daily. 07/25/19  Yes Corena Herter, PA-C  ibuprofen (ADVIL) 200 MG tablet Take 400 mg by mouth every 6 (six) hours as needed for fever, headache or mild pain.    Yes [provider]  lisinopril (ZESTRIL) 40 MG tablet Take 1 tablet (40 mg total) by mouth daily. 07/25/19 08/24/19 Yes Corena Herter, PA-C  spironolactone (ALDACTONE) 25 MG tablet TAKE HALF A TABLET BY MOUTH DAILY Patient taking differently: Take 12.5 mg by mouth once. 09/09/19  Yes Hilty, Nadean Corwin, MD  citalopram (CELEXA) 10 MG tablet Take 1 tablet (10 mg total) by mouth daily. 09/12/10 03/20/11  Gregor Hams, MD  omeprazole (PRILOSEC) 20 MG capsule Take 1 capsule (20 mg total) by mouth daily. 07/25/10 03/20/11  Gregor Hams, MD  potassium chloride SA (K-DUR,KLOR-CON) 20 MEQ tablet Take 1 tablet (20 mEq total) by mouth daily. For 7 days 08/21/10 03/20/11  Katherina Mires, MD    Allergies    Metoprolol  Review of Systems   Review of Systems  All other systems reviewed and are negative. Ten systems reviewed and are negative for acute change, except as noted in the HPI.   Physical Exam Updated Vital Signs BP (!) 254/142   Pulse 76   Temp 98.8 F (37.1 C) (Oral)   Resp 14   Ht 5\' 7"  (1.702 m)   SpO2 100%   BMI 27.88 kg/m   Physical Exam Vitals and nursing note reviewed.  Constitutional:      General: She is not in acute distress.    Appearance: Normal appearance. She is normal weight. She is not ill-appearing, toxic-appearing or diaphoretic.  HENT:     Head: Normocephalic and atraumatic.     Right Ear: External ear normal.     Left Ear: External ear normal.     Nose: Nose normal.     Mouth/Throat:     Mouth: Mucous membranes are moist.     Pharynx: Oropharynx is clear. No oropharyngeal exudate or posterior oropharyngeal erythema.  Eyes:     Extraocular Movements: Extraocular movements intact.   Cardiovascular:     Rate and Rhythm: Normal rate and regular rhythm.     Pulses: Normal pulses.     Heart sounds: Normal heart sounds. No murmur heard. No friction rub. No gallop.   Pulmonary:     Effort: Pulmonary effort is normal. No respiratory distress.     Breath sounds: Normal breath sounds. No stridor. No  wheezing, rhonchi or rales.     Comments: Lungs are clear to auscultation bilaterally.  Phonating in clear and complete sentences.  Oxygen saturations at 100%. Abdominal:     General: Abdomen is flat.     Palpations: Abdomen is soft.     Tenderness: There is no abdominal tenderness.  Musculoskeletal:        General: Normal range of motion.     Cervical back: Normal range of motion and neck supple. No tenderness.  Skin:    General: Skin is warm and dry.  Neurological:     General: No focal deficit present.     Mental Status: She is alert and oriented to person, place, and time.  Psychiatric:        Mood and Affect: Mood normal.        Behavior: Behavior normal.    ED Results / Procedures / Treatments   Labs (all labs ordered are listed, but only abnormal results are displayed) Labs Reviewed  CBC - Abnormal; Notable for the following components:      Result Value   WBC 11.6 (*)    Hemoglobin 15.6 (*)    All other components within normal limits  BASIC METABOLIC PANEL - Abnormal; Notable for the following components:   Potassium 3.1 (*)    All other components within normal limits  TROPONIN I (HIGH SENSITIVITY)  TROPONIN I (HIGH SENSITIVITY)   EKG EKG Interpretation  Date/Time:  Friday January 06 2020 21:51:48 EST Ventricular Rate:  69 PR Interval:    QRS Duration: 96 QT Interval:  455 QTC Calculation: 488 R Axis:   -44 Text Interpretation: Sinus rhythm Borderline prolonged PR interval RSR' in V1 or V2, right VCD or RVH Left ventricular hypertrophy Borderline prolonged QT interval Confirmed by Gerlene Fee [phone]) on 01/06/2020 11:26:13 PM  Radiology DG  Chest 2 View  Result Date: 01/06/2020 CLINICAL DATA:  Hypertension, anxiety EXAM: CHEST - 2 VIEW COMPARISON:  06/12/2018 FINDINGS: Frontal and lateral views of the chest demonstrate a stable cardiac silhouette. Continued ectasia of the thoracic aorta. No airspace disease, effusion, or pneumothorax. No acute bony abnormalities. IMPRESSION: 1. Stable exam, no acute process. Electronically Signed   By: Randa Ngo M.D.   On: 01/06/2020 16:18   Procedures Procedures   Medications Ordered in ED Medications  lisinopril (ZESTRIL) tablet 40 mg (40 mg Oral Given 01/06/20 2153)  amLODipine (NORVASC) tablet 10 mg (10 mg Oral Given 01/06/20 2152)  potassium chloride (KLOR-CON) CR tablet 50 mEq (50 mEq Oral Given 01/06/20 2152)  LORazepam (ATIVAN) tablet 1 mg (1 mg Oral Given 01/06/20 2301)   ED Course  I have reviewed the triage vital signs and the nursing notes.  Pertinent labs & imaging results that were available during my care of the patient were reviewed by me and considered in my medical decision making (see chart for details).    MDM Rules/Calculators/A&P                          Patient is a 44 year old female who presents the emergency department due to hypertensive urgency.  Patient states she was extremely anxious due to her high blood pressure which seems to be a factor today as well.  She was given a dose of Ativan.  She takes chlorthalidone, spironolactone, lisinopril, as well as amlodipine.  She has been out of her medications for the past 2 weeks.  She just refilled her chlorthalidone as well as her  spironolactone today and she took a dose of each after coming to the emergency department.  I also gave her home dose of lisinopril as well as Norvasc.  Basic labs were obtained.  Mild leukocytosis on CBC.  Likely reactive.  No elevation in troponin at 16.  BMP shows mild hypokalemia which was repleted with Klor-Con.  Creatinine within normal limits.  No sign of endorgan damage.   Patient appears to be stable at this time.  She states she is feeling much better. She noted some mild shortness of breath upon arrival but this is resolved.  No chest pain. Pt discussed with my attending physician who is in agreement with the above plan.   Discussed risks of hypertension at length with the patient.  She was given a new prescription for for amlodipine as well as her lisinopril.  Urged her to fill it as soon as possible and begin taking it tomorrow.  PCP follow-up as well as cardiology follow-up.  We discussed return precautions.  Her questions were answered and she was amicable at the time of discharge.  Final Clinical Impression(s) / ED Diagnoses Final diagnoses:  Hypertensive urgency   Rx / DC Orders ED Discharge Orders         Ordered    lisinopril (ZESTRIL) 40 MG tablet  Daily        01/06/20 2316    amLODipine (NORVASC) 10 MG tablet  Daily        01/06/20 2316           Rayna Sexton, PA-C 01/06/20 2327    Maudie Flakes, MD 01/06/20 2351

## 2020-01-09 NOTE — Telephone Encounter (Signed)
Can we schedule this pt an appointment with me?  I haven't seen her in clinic yet.

## 2020-01-12 ENCOUNTER — Other Ambulatory Visit: Payer: Self-pay

## 2020-01-12 ENCOUNTER — Other Ambulatory Visit: Payer: Medicaid Other

## 2020-01-12 DIAGNOSIS — Z20822 Contact with and (suspected) exposure to covid-19: Secondary | ICD-10-CM

## 2020-01-14 LAB — NOVEL CORONAVIRUS, NAA: SARS-CoV-2, NAA: NOT DETECTED

## 2020-01-14 LAB — SARS-COV-2, NAA 2 DAY TAT

## 2020-02-08 ENCOUNTER — Other Ambulatory Visit: Payer: Self-pay

## 2020-02-08 ENCOUNTER — Encounter (HOSPITAL_COMMUNITY): Payer: Self-pay

## 2020-02-08 ENCOUNTER — Emergency Department (HOSPITAL_COMMUNITY)
Admission: EM | Admit: 2020-02-08 | Discharge: 2020-02-08 | Disposition: A | Discharge status: Home / Self Care | Payer: Self-pay | Attending: Emergency Medicine | Admitting: Emergency Medicine

## 2020-02-08 ENCOUNTER — Emergency Department (HOSPITAL_COMMUNITY): Payer: Self-pay

## 2020-02-08 DIAGNOSIS — L089 Local infection of the skin and subcutaneous tissue, unspecified: Secondary | ICD-10-CM

## 2020-02-08 DIAGNOSIS — L0211 Cutaneous abscess of neck: Secondary | ICD-10-CM | POA: Insufficient documentation

## 2020-02-08 DIAGNOSIS — I10 Essential (primary) hypertension: Secondary | ICD-10-CM | POA: Insufficient documentation

## 2020-02-08 DIAGNOSIS — Z79899 Other long term (current) drug therapy: Secondary | ICD-10-CM | POA: Insufficient documentation

## 2020-02-08 DIAGNOSIS — Z7982 Long term (current) use of aspirin: Secondary | ICD-10-CM | POA: Insufficient documentation

## 2020-02-08 DIAGNOSIS — Z87891 Personal history of nicotine dependence: Secondary | ICD-10-CM | POA: Insufficient documentation

## 2020-02-08 LAB — CBC
HCT: 41.9 % (ref 36.0–46.0)
Hemoglobin: 14.9 g/dL (ref 12.0–15.0)
MCH: 31.7 pg (ref 26.0–34.0)
MCHC: 35.6 g/dL (ref 30.0–36.0)
MCV: 89.1 fL (ref 80.0–100.0)
Platelets: 236 10*3/uL (ref 150–400)
RBC: 4.7 MIL/uL (ref 3.87–5.11)
RDW: 12.1 % (ref 11.5–15.5)
WBC: 14.6 10*3/uL — ABNORMAL HIGH (ref 4.0–10.5)
nRBC: 0 % (ref 0.0–0.2)

## 2020-02-08 LAB — BASIC METABOLIC PANEL
Anion gap: 13 (ref 5–15)
BUN: 9 mg/dL (ref 6–20)
CO2: 25 mmol/L (ref 22–32)
Calcium: 9.4 mg/dL (ref 8.9–10.3)
Chloride: 98 mmol/L (ref 98–111)
Creatinine, Ser: 0.96 mg/dL (ref 0.44–1.00)
GFR, Estimated: >60 mL/min (ref >60)
Glucose, Bld: 107 mg/dL — ABNORMAL HIGH (ref 70–99)
Potassium: 3.1 mmol/L — ABNORMAL LOW (ref 3.5–5.1)
Sodium: 136 mmol/L (ref 135–145)

## 2020-02-08 MED ORDER — SODIUM CHLORIDE 0.9 % IV BOLUS
500.0000 mL | Freq: Once | INTRAVENOUS | Status: AC
  Administered 2020-02-08: 500 mL via INTRAVENOUS

## 2020-02-08 MED ORDER — CLINDAMYCIN HCL 150 MG PO CAPS
300.0000 mg | ORAL_CAPSULE | Freq: Once | ORAL | Status: AC
  Administered 2020-02-08: 300 mg via ORAL
  Filled 2020-02-08: qty 2

## 2020-02-08 MED ORDER — IOHEXOL 300 MG/ML  SOLN
75.0000 mL | Freq: Once | INTRAMUSCULAR | Status: AC | PRN
  Administered 2020-02-08: 75 mL via INTRAVENOUS

## 2020-02-08 MED ORDER — CLINDAMYCIN HCL 150 MG PO CAPS
300.0000 mg | ORAL_CAPSULE | Freq: Three times a day (TID) | ORAL | 0 refills | Status: AC
Start: 2020-02-08 — End: 2020-02-15

## 2020-02-08 NOTE — ED Triage Notes (Signed)
Patient reports that she has lower neck/under chin swelling x 3 days. Currently taking antibiotics and had lower tooth extracted yesterday. Patient complains of pain with swallowing. Alert and oriented

## 2020-02-08 NOTE — ED Provider Notes (Signed)
[facility] EMERGENCY DEPARTMENT Provider Note   CSN: [id number] Arrival date & time: 02/08/20  0918     History No chief complaint on file.   Katelyn [name] is a 45 y.o. female.  HPI Patient presents with concern of difficulty swallowing, speaking, and palpable lesion on her jaw. Patient is generally well aside from history of hypertension.  When she does have known poor dentition as well.  She recently developed right-sided mouth pain and with concern for dental issues she saw her dentist yesterday, had 1 tooth extracted.  Since that time she has felt increasing palpable fullness in her inferior jaw with difficulty swallowing, speaking.  No syncope, no nausea, vomiting, fever.  Symptoms are seemingly worse with talking or chewing.  Minimal relief with Tylenol, ibuprofen, pain is 5/10.     Past Medical History:  Diagnosis Date  . Abdominal pain, RLQ 06/23/2014  . AKI (acute kidney injury) ([facility]) 01/14/2015  . Anemia   . Anxiety   . Chest pain   . Depression   . Elevated LFTs 04/26/2012  . Hypertension   . Hypertensive urgency 06/13/2018  . Migraine   . Palpitations     Patient Active Problem List   Diagnosis Date Noted  . Well woman exam with routine gynecological exam 08/19/2018  . Leukocytosis 06/15/2018  . Hypertensive left ventricular hypertrophy 06/13/2018  . Hypertension 06/12/2018  . Hyperlipidemia   . Generalized headaches 06/16/2016  . Breast lump in upper outer quadrant 12/28/2014  . Ovarian torsion 11/25/2014  . Snoring 06/23/2014  . Migraine headache without aura 07/29/2012  . GAD (generalized anxiety disorder) 03/20/2011  . Panic disorder with agoraphobia and moderate panic attacks 02/26/2009  . CIGARETTE SMOKER 11/24/2008  . Resistant hypertension 03/05/2006    Past Surgical History:  Procedure Laterality Date  . HYSTEROSCOPY W/ ENDOMETRIAL ABLATION  2010  . LAPAROSCOPY N/A 11/25/2014   Procedure: LAPAROSCOPY OPERATIVE WITH LEFT  OVARIAN CYSTECTOMY;  Surgeon: Osborne Oman, MD;  Location: [place] [facility];  Service: Gynecology;  Laterality: N/A;  . TUBAL LIGATION  2006     OB History    Gravida  6   Para  3   Term  3   Preterm      AB  3   Living  3     SAB      IAB  3   Ectopic      Multiple      Live Births  3           Family History  Problem Relation Age of Onset  . Diabetes Mother   . Depression Mother   . Hypertension Mother   . Hypertension Father   . Hypertension Brother   . Hypertension Brother     Social History   Tobacco Use  . Smoking status: Former Smoker    Packs/day: 0.10    Years: 13.00    Pack years: 1.30    Types: Cigarettes    Start date: 01/06/1994    Quit date: 06/12/2018    Years since quitting: 1.6  . Smokeless tobacco: Never Used  . Tobacco comment: smoking  hx Max 1 PPD; trying to quit, 06/2016, quit for 7 years  Vaping Use  . Vaping Use: Never used  Substance Use Topics  . Alcohol use: Yes    Alcohol/week: 0.0 standard drinks    Comment: occassionally  . Drug use: No    Home Medications Prior to Admission medications   Medication Sig  Start Date End Date Taking? Authorizing Provider  amLODipine (NORVASC) 10 MG tablet TAKE ONE TABLET BY MOUTH DAILY 01/09/20   Benay Pike, MD  amLODipine (NORVASC) 10 MG tablet Take 1 tablet (10 mg total) by mouth daily. 01/06/20 02/05/20  Rayna Sexton, PA-C  Aspirin-Salicylamide-Caffeine ([facility] HEADACHE POWDER PO) Take 1 packet by mouth daily as needed (headache).    [provider]  atorvastatin (LIPITOR) 40 MG tablet TAKE ONE TABLET BY MOUTH DAILY 01/09/20   Benay Pike, MD  Calcium Carbonate Antacid (ALKA-SELTZER ANTACID PO) Take 1 tablet by mouth daily as needed (stomach acid).    [provider]  chlorthalidone (HYGROTON) 25 MG tablet Take 0.5 tablets (12.5 mg total) by mouth daily. 07/25/19   Corena Herter, PA-C  ibuprofen (ADVIL) 200 MG tablet Take 400 mg by mouth every 6 (six) hours as needed  for fever, headache or mild pain.     [provider]  lisinopril (ZESTRIL) 40 MG tablet Take 1 tablet (40 mg total) by mouth daily. 01/06/20 02/05/20  Rayna Sexton, PA-C  spironolactone (ALDACTONE) 25 MG tablet TAKE HALF A TABLET BY MOUTH DAILY Patient taking differently: Take 12.5 mg by mouth once. 09/09/19   Hilty, Nadean Corwin, MD  citalopram (CELEXA) 10 MG tablet Take 1 tablet (10 mg total) by mouth daily. 09/12/10 03/20/11  Gregor Hams, MD  omeprazole (PRILOSEC) 20 MG capsule Take 1 capsule (20 mg total) by mouth daily. 07/25/10 03/20/11  Gregor Hams, MD  potassium chloride SA (K-DUR,KLOR-CON) 20 MEQ tablet Take 1 tablet (20 mEq total) by mouth daily. For 7 days 08/21/10 03/20/11  Katherina Mires, MD    Allergies    Metoprolol  Review of Systems   Review of Systems  Constitutional:       Per HPI, otherwise negative  HENT:       Per HPI, otherwise negative  Respiratory:       Per HPI, otherwise negative  Cardiovascular:       Per HPI, otherwise negative  Gastrointestinal: Negative for vomiting.  Endocrine:       Negative aside from HPI  Genitourinary:       Neg aside from HPI   Musculoskeletal:       Per HPI, otherwise negative  Skin: Negative.   Neurological: Negative for syncope.    Physical Exam Updated Vital Signs BP (!) 148/83 (BP Location: Left Arm)   Pulse 90   Temp 98.4 F (36.9 C) (Oral)   Resp 17   Ht 5\' 8"  (1.727 m)   Wt 74.8 kg   SpO2 100%   BMI 25.09 kg/m   Physical Exam Vitals and nursing note reviewed.  Constitutional:      General: She is not in acute distress.    Appearance: She is well-developed and well-nourished.  HENT:     Head: Normocephalic and atraumatic.     Mouth/Throat:     Mouth: Mucous membranes are moist. No oral lesions.     Comments: No obvious bleeding, drainage, substantial erythema right teeth. Eyes:     Extraocular Movements: EOM normal.     Conjunctiva/sclera: Conjunctivae normal.  Neck:   Cardiovascular:      Rate and Rhythm: Normal rate and regular rhythm.  Pulmonary:     Effort: Pulmonary effort is normal. No respiratory distress.     Breath sounds: Normal breath sounds. No stridor.  Abdominal:     General: There is no distension.  Musculoskeletal:  General: No edema.  Skin:    General: Skin is warm and dry.  Neurological:     Mental Status: She is alert and oriented to person, place, and time.     Cranial Nerves: No cranial nerve deficit.  Psychiatric:        Mood and Affect: Mood and affect normal.     ED Results / Procedures / Treatments   Labs (all labs ordered are listed, but only abnormal results are displayed) Labs Reviewed  CBC - Abnormal; Notable for the following components:      Result Value   WBC 14.6 (*)    All other components within normal limits  BASIC METABOLIC PANEL - Abnormal; Notable for the following components:   Potassium 3.1 (*)    Glucose, Bld 107 (*)    All other components within normal limits  I-STAT BETA HCG BLOOD, ED ([facility], [facility], [facility] ONLY)    Radiology CT Soft Tissue Neck W Contrast  Result Date: 02/08/2020 CLINICAL DATA:  Submandibular swelling for 3 days EXAM: CT NECK WITH CONTRAST TECHNIQUE: Multidetector CT imaging of the neck was performed using the standard protocol following the bolus administration of intravenous contrast. CONTRAST:  47mL OMNIPAQUE IOHEXOL 300 MG/ML  SOLN COMPARISON:  None. FINDINGS: PHARYNX AND LARYNX: There is an empty socket at the site of the recent posterior right mandibular molar extraction. There is inflammatory change of the adjacent soft tissues. There is no peritonsillar or retropharyngeal abscess. The larynx is normal. There is mild thickening of the right platysma SALIVARY GLANDS: Normal parotid, submandibular and sublingual glands. THYROID: Normal. LYMPH NODES: There is inflammatory stranding within the right retromolar trigone. There are multiple enlarged level 1 A and 1B lymph nodes bilaterally, right greater than  left. Multiple subcentimeter left level 2A nodes. VASCULAR: Major cervical vessels are patent. LIMITED INTRACRANIAL: Normal. VISUALIZED ORBITS: Normal. MASTOIDS AND VISUALIZED PARANASAL SINUSES: No fluid levels or advanced mucosal thickening. No mastoid effusion. SKELETON: No bony spinal canal stenosis. No lytic or blastic lesions. UPPER CHEST: Clear. OTHER: None. IMPRESSION: 1. Inflammation of the right retromolar trigone surrounding the empty socket at the site of recent posterior right mandibular molar extraction. No abscess or drainable fluid collection. 2. Multiple enlarged level 1A and 1B lymph nodes bilaterally, right greater than left, likely reactive. Electronically Signed   By: Ulyses Jarred M.D.   On: 02/08/2020 19:29     Medications Ordered in ED Medications  sodium chloride 0.9 % bolus 500 mL (0 mLs Intravenous Stopped 02/08/20 1834)  iohexol (OMNIPAQUE) 300 MG/ML solution 75 mL (75 mLs Intravenous Contrast Given 02/08/20 1755)    ED Course  I have reviewed the triage vital signs and the nursing notes.  Pertinent labs & imaging results that were available during my care of the patient were reviewed by me and considered in my medical decision making (see chart for details).  8:30 PM Patient in no distress, awake, alert, sitting upright, speaking clearly. She and I discussed findings, and I reviewed her CT scan. No evidence for deep space infection/abscess. With mild leukocytosis, pain, palpable lesion, concerning for infection, patient will have broadening of her antibiotics, but without oropharyngeal abnormalities, deep space infection, evidence for airway compromise, patient is appropriate for discharge with close outpatient follow-up.   Final Clinical Impression(s) / ED Diagnoses Final diagnoses:  Neck infection    Rx / DC Orders ED Discharge Orders         Ordered    clindamycin (CLEOCIN) 150 MG capsule  3 times daily        02/08/20 2033           Carmin Muskrat,  MD 02/08/20 2034

## 2020-02-08 NOTE — ED Notes (Signed)
Patient transported to CT 

## 2020-02-08 NOTE — ED Notes (Signed)
RN gave pt chicken broth

## 2020-02-08 NOTE — Discharge Instructions (Addendum)
As discussed, today's evaluation has been generally reassuring. Your swelling is likely due to inflammation and early infection from your dental issues. Please take your newly prescribed antibiotic as directed, monitor your condition carefully, and do not hesitate to return here for concerning changes.

## 2020-03-18 ENCOUNTER — Other Ambulatory Visit: Payer: Self-pay | Admitting: Internal Medicine

## 2020-03-20 ENCOUNTER — Other Ambulatory Visit: Payer: Self-pay | Admitting: Family Medicine

## 2020-03-21 NOTE — Telephone Encounter (Signed)
Can we call this patient to let her know she will need to be seen in our clinic if she would like refills on her blood pressure medications.  Has not been seen here since 2020.

## 2020-03-26 NOTE — Telephone Encounter (Signed)
Has anyone reached out to her about an appointment?

## 2020-03-29 NOTE — Telephone Encounter (Signed)
I called the number listed in patients chart. On the voicemail she asked to call another number. I called the number and it said it could not be completed. I am not sure what the best way to get in contact with the patient.

## 2020-04-06 ENCOUNTER — Encounter (HOSPITAL_COMMUNITY): Payer: Self-pay

## 2020-04-06 ENCOUNTER — Other Ambulatory Visit: Payer: Self-pay

## 2020-04-06 ENCOUNTER — Emergency Department (HOSPITAL_COMMUNITY)
Admission: EM | Admit: 2020-04-06 | Discharge: 2020-04-06 | Disposition: A | Discharge status: Home / Self Care | Payer: Self-pay | Attending: Emergency Medicine | Admitting: Emergency Medicine

## 2020-04-06 ENCOUNTER — Emergency Department (HOSPITAL_COMMUNITY): Payer: Self-pay

## 2020-04-06 DIAGNOSIS — Z79899 Other long term (current) drug therapy: Secondary | ICD-10-CM | POA: Insufficient documentation

## 2020-04-06 DIAGNOSIS — Z7982 Long term (current) use of aspirin: Secondary | ICD-10-CM | POA: Insufficient documentation

## 2020-04-06 DIAGNOSIS — I10 Essential (primary) hypertension: Secondary | ICD-10-CM | POA: Insufficient documentation

## 2020-04-06 DIAGNOSIS — Z87891 Personal history of nicotine dependence: Secondary | ICD-10-CM | POA: Insufficient documentation

## 2020-04-06 LAB — BASIC METABOLIC PANEL
Anion gap: 9 (ref 5–15)
BUN: 10 mg/dL (ref 6–20)
CO2: 22 mmol/L (ref 22–32)
Calcium: 9.2 mg/dL (ref 8.9–10.3)
Chloride: 105 mmol/L (ref 98–111)
Creatinine, Ser: 0.8 mg/dL (ref 0.44–1.00)
GFR, Estimated: >60 mL/min (ref >60)
Glucose, Bld: 121 mg/dL — ABNORMAL HIGH (ref 70–99)
Potassium: 3.4 mmol/L — ABNORMAL LOW (ref 3.5–5.1)
Sodium: 136 mmol/L (ref 135–145)

## 2020-04-06 LAB — CBC
HCT: 44.6 % (ref 36.0–46.0)
Hemoglobin: 15.7 g/dL — ABNORMAL HIGH (ref 12.0–15.0)
MCH: 31.7 pg (ref 26.0–34.0)
MCHC: 35.2 g/dL (ref 30.0–36.0)
MCV: 90.1 fL (ref 80.0–100.0)
Platelets: 282 10*3/uL (ref 150–400)
RBC: 4.95 MIL/uL (ref 3.87–5.11)
RDW: 13.5 % (ref 11.5–15.5)
WBC: 16.7 10*3/uL — ABNORMAL HIGH (ref 4.0–10.5)
nRBC: 0 % (ref 0.0–0.2)

## 2020-04-06 LAB — I-STAT BETA HCG BLOOD, ED (MC, WL, AP ONLY): I-stat hCG, quantitative: <5 m[IU]/mL (ref <5)

## 2020-04-06 LAB — TROPONIN I (HIGH SENSITIVITY)
Troponin I (High Sensitivity): 10 ng/L (ref <18)
Troponin I (High Sensitivity): 6 ng/L (ref <18)

## 2020-04-06 MED ORDER — LIDOCAINE VISCOUS HCL 2 % MT SOLN
15.0000 mL | Freq: Once | OROMUCOSAL | Status: AC
  Administered 2020-04-06: 15 mL via ORAL
  Filled 2020-04-06: qty 15

## 2020-04-06 MED ORDER — LISINOPRIL 40 MG PO TABS: 40.0000 mg | ORAL_TABLET | Freq: Every day | ORAL | 0 refills | Status: DC

## 2020-04-06 MED ORDER — HYDRALAZINE HCL 25 MG PO TABS
25.0000 mg | ORAL_TABLET | Freq: Once | ORAL | Status: AC
  Administered 2020-04-06: 25 mg via ORAL
  Filled 2020-04-06: qty 1

## 2020-04-06 MED ORDER — SPIRONOLACTONE 25 MG PO TABS: ORAL_TABLET | ORAL | 0 refills | Status: DC

## 2020-04-06 MED ORDER — AMLODIPINE BESYLATE 10 MG PO TABS: 10.0000 mg | ORAL_TABLET | Freq: Every day | ORAL | 0 refills | Status: DC

## 2020-04-06 MED ORDER — CHLORTHALIDONE 25 MG PO TABS: 12.5000 mg | ORAL_TABLET | Freq: Every day | ORAL | 0 refills | Status: DC

## 2020-04-06 MED ORDER — ALUM & MAG HYDROXIDE-SIMETH 200-200-20 MG/5ML PO SUSP
30.0000 mL | Freq: Once | ORAL | Status: AC
  Administered 2020-04-06: 30 mL via ORAL
  Filled 2020-04-06: qty 30

## 2020-04-06 NOTE — ED Provider Notes (Signed)
[name] DEPT Provider Note   CSN: [id number] Arrival date & time: 04/06/20  1624     History Chief Complaint  Patient presents with  . Chest Pain  . Hypertension    Katelyn [name] is a 45 y.o. female.  HPI Patient is a 45 year old female presented today with sternal chest pain that she describes as more of a discomfort and burning sensation.  She denies any nausea or vomiting.  She states that this began today.  She states her chest discomfort is nonradiating, nonexertional nonpleuritic.  She denies any shortness of breath nausea or vomiting.  No other associate symptoms.  She states that her blood pressure is usually approximately 150/100.  She states that she takes all her medications as prescribed apart from she takes half the dose of spironolactone instead of a full dose of spironolactone.  She states that she does not follow with a primary care doctor she states that she has not followed up with PCP simply because she has not had a chance to.  She states that her blood pressure was elevated this morning approximately 150/100.  She states that she took her medications but states that her blood pressure seemed to become more elevated over the course of the day.     Past Medical History:  Diagnosis Date  . Abdominal pain, RLQ 06/23/2014  . AKI (acute kidney injury) ([place]) 01/14/2015  . Anemia   . Anxiety   . Chest pain   . Depression   . Elevated LFTs 04/26/2012  . Hypertension   . Hypertensive urgency 06/13/2018  . Migraine   . Palpitations     Patient Active Problem List   Diagnosis Date Noted  . Well woman exam with routine gynecological exam 08/19/2018  . Leukocytosis 06/15/2018  . Hypertensive left ventricular hypertrophy 06/13/2018  . Hypertension 06/12/2018  . Hyperlipidemia   . Generalized headaches 06/16/2016  . Breast lump in upper outer quadrant 12/28/2014  . Ovarian torsion 11/25/2014  . Snoring 06/23/2014  . Migraine  headache without aura 07/29/2012  . GAD (generalized anxiety disorder) 03/20/2011  . Panic disorder with agoraphobia and moderate panic attacks 02/26/2009  . CIGARETTE SMOKER 11/24/2008  . Resistant hypertension 03/05/2006    Past Surgical History:  Procedure Laterality Date  . HYSTEROSCOPY W/ ENDOMETRIAL ABLATION  2010  . LAPAROSCOPY N/A 11/25/2014   Procedure: LAPAROSCOPY OPERATIVE WITH LEFT OVARIAN CYSTECTOMY;  Surgeon: Osborne Oman, MD;  Location: [facility];  Service: Gynecology;  Laterality: N/A;  . TUBAL LIGATION  2006     OB History    Gravida  6   Para  3   Term  3   Preterm      AB  3   Living  3     SAB      IAB  3   Ectopic      Multiple      Live Births  3           Family History  Problem Relation Age of Onset  . Diabetes Mother   . Depression Mother   . Hypertension Mother   . Hypertension Father   . Hypertension Brother   . Hypertension Brother     Social History   Tobacco Use  . Smoking status: Former Smoker    Packs/day: 0.10    Years: 13.00    Pack years: 1.30    Types: Cigarettes    Start date: 01/06/1994    Quit date: 06/12/2018  Years since quitting: 1.8  . Smokeless tobacco: Never Used  . Tobacco comment: smoking  hx Max 1 PPD; trying to quit, 06/2016, quit for 7 years  Vaping Use  . Vaping Use: Never used  Substance Use Topics  . Alcohol use: Yes    Alcohol/week: 0.0 standard drinks    Comment: occassionally  . Drug use: No    Home Medications Prior to Admission medications   Medication Sig Start Date End Date Taking? Authorizing Provider  Aspirin-Salicylamide-Caffeine (BC HEADACHE POWDER PO) Take 1 packet by mouth daily as needed (headache).   Yes [provider]  atorvastatin (LIPITOR) 40 MG tablet TAKE ONE TABLET BY MOUTH DAILY Patient taking differently: Take 40 mg by mouth daily. 01/09/20  Yes Benay Pike, MD  Calcium Carbonate Antacid (ALKA-SELTZER ANTACID PO) Take 1 tablet by mouth daily as needed  (stomach acid).   Yes [provider]  amLODipine (NORVASC) 10 MG tablet Take 1 tablet (10 mg total) by mouth daily. 04/06/20   Tedd Sias, PA  chlorthalidone (HYGROTON) 25 MG tablet Take 0.5 tablets (12.5 mg total) by mouth daily. 04/06/20   Tedd Sias, PA  lisinopril (ZESTRIL) 40 MG tablet Take 1 tablet (40 mg total) by mouth daily. 04/06/20 05/06/20  Tedd Sias, PA  spironolactone (ALDACTONE) 25 MG tablet TAKE HALF A TABLET BY MOUTH DAILY 04/06/20   Pati Gallo S, PA  citalopram (CELEXA) 10 MG tablet Take 1 tablet (10 mg total) by mouth daily. 09/12/10 03/20/11  Gregor Hams, MD  omeprazole (PRILOSEC) 20 MG capsule Take 1 capsule (20 mg total) by mouth daily. 07/25/10 03/20/11  Gregor Hams, MD  potassium chloride SA (K-DUR,KLOR-CON) 20 MEQ tablet Take 1 tablet (20 mEq total) by mouth daily. For 7 days 08/21/10 03/20/11  Katherina Mires, MD    Allergies    Metoprolol  Review of Systems   Review of Systems  Constitutional: Negative for chills and fever.  HENT: Negative for congestion.   Eyes: Negative for pain.  Respiratory: Negative for cough and shortness of breath.   Cardiovascular: Negative for leg swelling. Chest pain: burning central.  Gastrointestinal: Negative for abdominal pain and vomiting.  Genitourinary: Negative for dysuria.  Musculoskeletal: Negative for myalgias.  Skin: Negative for rash.  Neurological: Negative for dizziness and headaches.    Physical Exam Updated Vital Signs BP (!) 174/114   Pulse 76   Temp 98.2 F (36.8 C) (Oral)   Resp 17   Ht 5\' 9"  (1.753 m)   SpO2 100%   BMI 24.37 kg/m   Physical Exam Vitals and nursing note reviewed.  Constitutional:      General: She is not in acute distress.    Comments: Pleasant well-appearing 45 year old.  In no acute distress.  Sitting comfortably in bed.  Able answer questions appropriately follow commands. No increased work of breathing. Speaking in full sentences.   HENT:     Head:  Normocephalic and atraumatic.     Nose: Nose normal.     Mouth/Throat:     Mouth: Mucous membranes are moist.  Eyes:     General: No scleral icterus. Cardiovascular:     Rate and Rhythm: Normal rate and regular rhythm.     Pulses: Normal pulses.     Heart sounds: Normal heart sounds.     Comments: Bilateral radial artery pulses 3+ and symmetric Pulmonary:     Effort: Pulmonary effort is normal. No respiratory distress.     Breath sounds:  No wheezing.  Abdominal:     Palpations: Abdomen is soft.     Tenderness: There is no abdominal tenderness. There is no right CVA tenderness, left CVA tenderness, guarding or rebound.  Musculoskeletal:     Cervical back: Normal range of motion.     Right lower leg: No edema.     Left lower leg: No edema.  Skin:    General: Skin is warm and dry.     Capillary Refill: Capillary refill takes less than 2 seconds.  Neurological:     Mental Status: She is alert. Mental status is at baseline.  Psychiatric:        Mood and Affect: Mood normal.        Behavior: Behavior normal.     ED Results / Procedures / Treatments   Labs (all labs ordered are listed, but only abnormal results are displayed) Labs Reviewed  BASIC METABOLIC PANEL - Abnormal; Notable for the following components:      Result Value   Potassium 3.4 (*)    Glucose, Bld 121 (*)    All other components within normal limits  CBC - Abnormal; Notable for the following components:   WBC 16.7 (*)    Hemoglobin 15.7 (*)    All other components within normal limits  I-STAT BETA HCG BLOOD, ED ([facility], [facility], [facility] ONLY)  TROPONIN I (HIGH SENSITIVITY)  TROPONIN I (HIGH SENSITIVITY)    EKG EKG Interpretation  Date/Time:  Friday April 06 2020 16:33:00 EDT Ventricular Rate:  105 PR Interval:  177 QRS Duration: 86 QT Interval:  371 QTC Calculation: 491 R Axis:   -56 Text Interpretation: Sinus tachycardia Consider right atrial enlargement Abnormal R-wave progression, early transition Inferior  infarct, old 12 Lead; Mason-Likar No significant change since last tracing Confirmed by Lacretia Leigh ([id number]) on 04/06/2020 5:02:00 PM   Radiology DG Chest 2 View  Result Date: 04/06/2020 CLINICAL DATA:  Onset hypertension, chest pain and left arm tingling today. EXAM: CHEST - 2 VIEW COMPARISON:  PA and lateral chest 01/06/2020. FINDINGS: Lungs clear. Heart size normal. No pneumothorax or pleural fluid. No bony abnormality. IMPRESSION: Normal chest. Electronically Signed   By: Inge Rise M.D.   On: 04/06/2020 17:16    Procedures Procedures   Medications Ordered in ED Medications  alum & mag hydroxide-simeth (MAALOX/MYLANTA) 200-200-20 MG/5ML suspension 30 mL (30 mLs Oral Given 04/06/20 2042)    And  lidocaine (XYLOCAINE) 2 % viscous mouth solution 15 mL (15 mLs Oral Given 04/06/20 2042)  hydrALAZINE (APRESOLINE) tablet 25 mg (25 mg Oral Given 04/06/20 2042)    ED Course  I have reviewed the triage vital signs and the nursing notes.  Pertinent labs & imaging results that were available during my care of the patient were reviewed by me and considered in my medical decision making (see chart for details).    MDM Rules/Calculators/A&P                          Patient here with elevated blood pressure also states that she is having some burning chest pain since this morning.  Her blood pressure today has been significantly elevated.  During my evaluation of patient her blood pressure was as high as 210/118  I reviewed prior records and it appears the patient has had significantly elevated blood pressure in the past.  She states that she is taking all of her medications as prescribed.  She states that she needs refills  on these medications however.  I will refill these prescriptions as needed to her paresthesias pharmacy.  Given the nature of her chest pain is being sternal, burning, potentially somewhat worse with reclining position I suspect that this is reflux.  Her blood pressure  improved with 1 dose of hydralazine is now 158/98. Vitals with BMI 04/06/2020 04/06/2020 04/06/2020  Height - - -  Weight - - -  BMI - - -  Systolic 644 034 742  Diastolic 595 638 98  Pulse 75 76 -    Patient is agreeable to discharge at this time. She will follow-up with PCP to have her blood pressure rechecked and continue to titrate medications.  She is given strict return precautions.  Chest x-ray is without any pulmonary edema, CBC without any significant changes it appears that she has a chronic leukocytosis.  She also appears somewhat dehydrated this could be some hemoconcentration.  BMP unremarkable.  Troponin x2 within normal limits.  I-STAT Hg negative for pregnancy.  EKG unremarkable essentially unchanged.  There is some evidence of atrial enlargement.  I had a lengthy discussion with patient about the portance of close follow-up with PCP given that longstanding hypertension can lead to stroke, renal failure, heart failure.  She is understanding of this.  Final Clinical Impression(s) / ED Diagnoses Final diagnoses:  Hypertension, unspecified type    Rx / DC Orders ED Discharge Orders         Ordered    lisinopril (ZESTRIL) 40 MG tablet  Daily,   Status:  Discontinued        04/06/20 2149    spironolactone (ALDACTONE) 25 MG tablet       Note to Pharmacy: PLEASE SCHEDULE AN APPT FOR FUTURE REFILLS   04/06/20 2149    chlorthalidone (HYGROTON) 25 MG tablet  Daily        04/06/20 2149    amLODipine (NORVASC) 10 MG tablet  Daily        04/06/20 2149    lisinopril (ZESTRIL) 40 MG tablet  Daily        04/06/20 2149           Tedd Sias, [place] 04/06/20 2157    Daleen Bo, MD 04/09/20 1011

## 2020-04-06 NOTE — ED Triage Notes (Signed)
Pt reports high BP that began this morning even with taking daily medication. Pt endorses intermittent dull central chest pain with tingling in left arm.

## 2020-04-06 NOTE — ED Provider Notes (Signed)
MSE was initiated and I personally evaluated the patient and placed orders (if any) at  4:59 PM on April 06, 2020.  The patient appears stable so that the remainder of the MSE may be completed by another provider.  Patient placed in Quick Look pathway, seen and evaluated   Chief Complaint: Chest pain  HPI:   45 year old female presents with chest pressure x24 hours with intermittent left arm radiation.  Also notes increased blood pressure x1 day.  ROS: Denies any syncope.  (one)  Physical Exam:   Gen: No distress  Neuro: Awake and Alert  Skin: Warm    Focused Exam: Respiratory rate is stable   Initiation of care has begun. The patient has been counseled on the process, plan, and necessity for staying for the completion/evaluation, and the remainder of the medical screening examination   Lacretia Leigh, MD 04/06/20 1700

## 2020-04-06 NOTE — ED Notes (Signed)
[place], PA aware of patients elevated blood pressure before discharge.

## 2020-04-06 NOTE — ED Notes (Signed)
Urine sample has been sent down to lab.  

## 2020-04-06 NOTE — Discharge Instructions (Signed)
It is vitally important to follow-up with the [facility] for continued medication prescription.

## 2020-05-18 ENCOUNTER — Other Ambulatory Visit: Payer: Self-pay | Admitting: Endocrinology

## 2020-05-18 DIAGNOSIS — Z1231 Encounter for screening mammogram for malignant neoplasm of breast: Secondary | ICD-10-CM

## 2020-06-26 ENCOUNTER — Other Ambulatory Visit: Payer: Self-pay | Admitting: Obstetrics and Gynecology

## 2020-06-26 DIAGNOSIS — Z1231 Encounter for screening mammogram for malignant neoplasm of breast: Secondary | ICD-10-CM

## 2020-08-07 ENCOUNTER — Other Ambulatory Visit: Payer: Self-pay

## 2020-08-07 ENCOUNTER — Ambulatory Visit: Payer: Self-pay | Admitting: *Deleted

## 2020-08-07 VITALS — BP 120/78 | Wt 167.4 lb

## 2020-08-07 DIAGNOSIS — Z1239 Encounter for other screening for malignant neoplasm of breast: Secondary | ICD-10-CM

## 2020-08-07 NOTE — Patient Instructions (Signed)
Explained breast self awareness withTracy Gordy Levan. Patient did not need a Pap smear today due to last Pap smear was in June 2022 per patient. Let her know BCCCP will cover Pap smears every 3 years unless has a history of abnormal Pap smears. Referred patient to the [place] for a screening mammogram on mobile unit. Appointment scheduled Thursday, August 16, 2020 at 1400 at the [facility]. Patient aware of appointment and will be there. Discussed smoking cessation with patient. Referred to the [facility] Quitline and gave resources to the free smoking cessation classes at [facility]. Katelyn [name] verbalized understanding.  [name], Arvil Chaco, RN 3:30 PM

## 2020-08-07 NOTE — Progress Notes (Signed)
[name] is a 45 y.o. female who presents to [facility] clinic today with no complaints.    Pap Smear: Pap smear not completed today. Last Pap smear was in June 2022 at [facility] clinic and was normal per patient. Per patient has history of an abnormal Pap smear 01/11/2015 that was ASCUS with negative HPV. All Pap smears have been normal since abnormal Pap smear. Last Pap smear result is not available in Epic.   Physical exam: Breasts Breasts symmetrical. No skin abnormalities bilateral breasts. No nipple retraction bilateral breasts. No nipple discharge bilateral breasts. No lymphadenopathy. No lumps palpated bilateral breasts. No complaints of pain or tenderness on exam.  MS DIGITAL SCREENING TOMO BILATERAL  Result Date: 08/20/2018 CLINICAL DATA:  Screening. EXAM: DIGITAL SCREENING BILATERAL MAMMOGRAM WITH TOMO AND CAD COMPARISON:  Previous exam(s). ACR Breast Density Category c: The breast tissue is heterogeneously dense, which may obscure small masses. FINDINGS: There are no findings suspicious for malignancy. Images were processed with CAD. IMPRESSION: No mammographic evidence of malignancy. A result letter of this screening mammogram will be mailed directly to the patient. RECOMMENDATION: Screening mammogram in one year. (Code:SM-B-01Y) BI-RADS CATEGORY  1: Negative. Electronically Signed   By: Lajean Manes M.D.   On: 08/20/2018 13:47        Pelvic/Bimanual Pap is not indicated today per BCCCP guidelines.   Smoking History: Patient is a current smoker. Discussed smoking cessation with patient. Referred to the [facility] Quitline and gave resources to the free smoking cessation classes at [facility].   Patient Navigation: Patient education provided. Access to services provided for patient through [place] program.   Colorectal Cancer Screening: Per patient has never had colonoscopy completed. No complaints today.    Breast and Cervical Cancer Risk Assessment: Patient does  not have family history of breast cancer, known genetic mutations, or radiation treatment to the chest before age 31. Patient does not have history of cervical dysplasia, immunocompromised, or DES exposure in-utero.  Risk Assessment     Risk Scores       08/07/2020 08/19/2018   Last edited by: Demetrius Revel, LPN Rolena Infante H, LPN   5-year risk: 0.7 % 0.6 %   Lifetime risk: 7.1 % 7.3 %            A: BCCCP exam without pap smear No complaints.  P: Referred patient to the [facility] for a screening mammogram on mobile unit. Appointment scheduled Thursday, August 16, 2020 at 1400 at the [facility].  Katelyn Parish, RN 08/07/2020 3:30 PM

## 2020-08-16 ENCOUNTER — Other Ambulatory Visit: Payer: Self-pay

## 2020-08-16 ENCOUNTER — Ambulatory Visit
Admission: RE | Admit: 2020-08-16 | Discharge: 2020-08-16 | Disposition: A | Discharge status: Home / Self Care | Payer: No Typology Code available for payment source | Source: Ambulatory Visit | Attending: Obstetrics and Gynecology | Admitting: Obstetrics and Gynecology

## 2020-08-16 DIAGNOSIS — Z1231 Encounter for screening mammogram for malignant neoplasm of breast: Secondary | ICD-10-CM

## 2021-02-21 ENCOUNTER — Encounter (HOSPITAL_COMMUNITY): Payer: Self-pay

## 2021-02-21 ENCOUNTER — Emergency Department (HOSPITAL_COMMUNITY): Payer: 59

## 2021-02-21 ENCOUNTER — Ambulatory Visit
Admission: EM | Admit: 2021-02-21 | Discharge: 2021-02-21 | Disposition: A | Discharge status: Home / Self Care | Payer: Medicaid Other

## 2021-02-21 ENCOUNTER — Emergency Department (HOSPITAL_COMMUNITY)
Admission: EM | Admit: 2021-02-21 | Discharge: 2021-02-21 | Disposition: A | Discharge status: Home / Self Care | Payer: 59 | Attending: Emergency Medicine | Admitting: Emergency Medicine

## 2021-02-21 ENCOUNTER — Other Ambulatory Visit: Payer: Self-pay

## 2021-02-21 DIAGNOSIS — I10 Essential (primary) hypertension: Secondary | ICD-10-CM | POA: Insufficient documentation

## 2021-02-21 DIAGNOSIS — Z79899 Other long term (current) drug therapy: Secondary | ICD-10-CM | POA: Diagnosis not present

## 2021-02-21 DIAGNOSIS — R10811 Right upper quadrant abdominal tenderness: Secondary | ICD-10-CM

## 2021-02-21 DIAGNOSIS — R1011 Right upper quadrant pain: Secondary | ICD-10-CM | POA: Insufficient documentation

## 2021-02-21 DIAGNOSIS — R1013 Epigastric pain: Secondary | ICD-10-CM | POA: Diagnosis not present

## 2021-02-21 DIAGNOSIS — R109 Unspecified abdominal pain: Secondary | ICD-10-CM

## 2021-02-21 LAB — COMPREHENSIVE METABOLIC PANEL
ALT: 49 U/L — ABNORMAL HIGH (ref 0–44)
AST: 55 U/L — ABNORMAL HIGH (ref 15–41)
Albumin: 4.7 g/dL (ref 3.5–5.0)
Alkaline Phosphatase: 88 U/L (ref 38–126)
Anion gap: 11 (ref 5–15)
BUN: 18 mg/dL (ref 6–20)
CO2: 23 mmol/L (ref 22–32)
Calcium: 9.6 mg/dL (ref 8.9–10.3)
Chloride: 102 mmol/L (ref 98–111)
Creatinine, Ser: 1.02 mg/dL — ABNORMAL HIGH (ref 0.44–1.00)
GFR, Estimated: >60 mL/min (ref >60)
Glucose, Bld: 99 mg/dL (ref 70–99)
Potassium: 3.6 mmol/L (ref 3.5–5.1)
Sodium: 136 mmol/L (ref 135–145)
Total Bilirubin: 0.8 mg/dL (ref 0.3–1.2)
Total Protein: 8.5 g/dL — ABNORMAL HIGH (ref 6.5–8.1)

## 2021-02-21 LAB — CBC WITH DIFFERENTIAL/PLATELET
Abs Immature Granulocytes: 0.03 10*3/uL (ref 0.00–0.07)
Basophils Absolute: 0.1 10*3/uL (ref 0.0–0.1)
Basophils Relative: 1 %
Eosinophils Absolute: 0.6 10*3/uL — ABNORMAL HIGH (ref 0.0–0.5)
Eosinophils Relative: 4 %
HCT: 47 % — ABNORMAL HIGH (ref 36.0–46.0)
Hemoglobin: 17.1 g/dL — ABNORMAL HIGH (ref 12.0–15.0)
Immature Granulocytes: 0 %
Lymphocytes Relative: 24 %
Lymphs Abs: 3.4 10*3/uL (ref 0.7–4.0)
MCH: 33.3 pg (ref 26.0–34.0)
MCHC: 36.4 g/dL — ABNORMAL HIGH (ref 30.0–36.0)
MCV: 91.6 fL (ref 80.0–100.0)
Monocytes Absolute: 0.9 10*3/uL (ref 0.1–1.0)
Monocytes Relative: 6 %
Neutro Abs: 9.5 10*3/uL — ABNORMAL HIGH (ref 1.7–7.7)
Neutrophils Relative %: 65 %
Platelets: 248 10*3/uL (ref 150–400)
RBC: 5.13 MIL/uL — ABNORMAL HIGH (ref 3.87–5.11)
RDW: 12 % (ref 11.5–15.5)
WBC: 14.4 10*3/uL — ABNORMAL HIGH (ref 4.0–10.5)
nRBC: 0 % (ref 0.0–0.2)

## 2021-02-21 LAB — URINALYSIS, ROUTINE W REFLEX MICROSCOPIC
Bilirubin Urine: NEGATIVE
Glucose, UA: NEGATIVE mg/dL
Hgb urine dipstick: NEGATIVE
Ketones, ur: NEGATIVE mg/dL
Leukocytes,Ua: NEGATIVE
Nitrite: NEGATIVE
Protein, ur: NEGATIVE mg/dL
Specific Gravity, Urine: 1.013 (ref 1.005–1.030)
pH: 6 (ref 5.0–8.0)

## 2021-02-21 LAB — TROPONIN I (HIGH SENSITIVITY)
Troponin I (High Sensitivity): 10 ng/L (ref <18)
Troponin I (High Sensitivity): 8 ng/L (ref <18)

## 2021-02-21 LAB — LIPASE, BLOOD: Lipase: 42 U/L (ref 11–51)

## 2021-02-21 MED ORDER — OMEPRAZOLE 20 MG PO CPDR: 20.0000 mg | DELAYED_RELEASE_CAPSULE | Freq: Every day | ORAL | 0 refills | Status: DC

## 2021-02-21 NOTE — ED Triage Notes (Signed)
Pt presents with c/o hypertension and chest pain that has been off and on today. Pt has a hx of hypertension, on medication for same.

## 2021-02-21 NOTE — ED Provider Notes (Signed)
[facility]  ____________________________________________  Time seen: Approximately 1:59 PM  I have reviewed the triage vital signs and the nursing notes.   HISTORY  Chief Complaint Abdominal Pain and Gastroesophageal Reflux   Historian Patient    HPI Katelyn [name] is a 46 y.o. female with history of hypertension, hypertensive urgency, palpitations, AKI and ventricular hypertrophy presents to the urgent care with chest pain and epigastric abdominal discomfort.  Patient states that symptoms have occurred for the past 2 to 3 days.  She states that she did recently run out of one of her medications but she cannot remember which one.  She states that she takes multiple medications for hypertension and she has been compliant with what she has.  She denies fever and chills.  No cough.  She states she has had some nausea but no vomiting   Past Medical History:  Diagnosis Date   Abdominal pain, RLQ 06/23/2014   AKI (acute kidney injury) ([place]) 01/14/2015   Anemia    Anxiety    Chest pain    Depression    Elevated LFTs 04/26/2012   Hypertension    Hypertensive urgency 06/13/2018   Migraine    Palpitations      Immunizations up to date:  Yes.     Past Medical History:  Diagnosis Date   Abdominal pain, RLQ 06/23/2014   AKI (acute kidney injury) ([place]) 01/14/2015   Anemia    Anxiety    Chest pain    Depression    Elevated LFTs 04/26/2012   Hypertension    Hypertensive urgency 06/13/2018   Migraine    Palpitations     Patient Active Problem List   Diagnosis Date Noted   Well woman exam with routine gynecological exam 08/19/2018   Leukocytosis 06/15/2018   Hypertensive left ventricular hypertrophy 06/13/2018   Hypertension 06/12/2018   Hyperlipidemia    Generalized headaches 06/16/2016   Breast lump in upper outer quadrant 12/28/2014   Ovarian torsion 11/25/2014   Snoring 06/23/2014   Migraine headache without aura 07/29/2012   GAD (generalized anxiety  disorder) 03/20/2011   Panic disorder with agoraphobia and moderate panic attacks 02/26/2009   CIGARETTE SMOKER 11/24/2008   Resistant hypertension 03/05/2006    Past Surgical History:  Procedure Laterality Date   HYSTEROSCOPY W/ ENDOMETRIAL ABLATION  2010   LAPAROSCOPY N/A 11/25/2014   Procedure: LAPAROSCOPY OPERATIVE WITH LEFT OVARIAN CYSTECTOMY;  Surgeon: Osborne Oman, MD;  Location: [facility];  Service: Gynecology;  Laterality: N/A;   TUBAL LIGATION  2006    Prior to Admission medications   Medication Sig Start Date End Date Taking? Authorizing Provider  omeprazole (PRILOSEC) 20 MG capsule Take 20 mg by mouth daily.   Yes [provider]  amLODipine (NORVASC) 10 MG tablet Take 1 tablet (10 mg total) by mouth daily. Patient not taking: Reported on 08/07/2020 04/06/20   Tedd Sias, PA  Aspirin-Salicylamide-Caffeine (BC HEADACHE POWDER PO) Take 1 packet by mouth daily as needed (headache).    [provider]  atorvastatin (LIPITOR) 40 MG tablet TAKE ONE TABLET BY MOUTH DAILY Patient taking differently: Take 40 mg by mouth daily. 01/09/20   Benay Pike, MD  atorvastatin (LIPITOR) 80 MG tablet Take 80 mg by mouth daily.    [provider]  Calcium Carbonate Antacid (ALKA-SELTZER ANTACID PO) Take 1 tablet by mouth daily as needed (stomach acid).    [provider]  chlorthalidone (HYGROTON) 25 MG tablet Take 0.5 tablets (12.5 mg total)  by mouth daily. 04/06/20   Tedd Sias, PA  diltiazem (DILACOR XR) 240 MG 24 hr capsule Take 240 mg by mouth daily.    [provider]  lisinopril (ZESTRIL) 10 MG tablet Take 10 mg by mouth daily.    [provider]  lisinopril (ZESTRIL) 40 MG tablet Take 1 tablet (40 mg total) by mouth daily. 04/06/20 05/06/20  Tedd Sias, PA  metoprolol succinate (TOPROL-XL) 50 MG 24 hr tablet Take 50 mg by mouth daily. Take with or immediately following a meal.    [provider]  spironolactone  (ALDACTONE) 25 MG tablet TAKE HALF A TABLET BY MOUTH DAILY 04/06/20   Pati Gallo S, PA  triamterene-hydrochlorothiazide (MAXZIDE-25) 37.5-25 MG tablet Take 1 tablet by mouth daily.    [provider]  citalopram (CELEXA) 10 MG tablet Take 1 tablet (10 mg total) by mouth daily. 09/12/10 03/20/11  Gregor Hams, MD  potassium chloride SA (K-DUR,KLOR-CON) 20 MEQ tablet Take 1 tablet (20 mEq total) by mouth daily. For 7 days 08/21/10 03/20/11  Katherina Mires, MD    Allergies Metoprolol  Family History  Problem Relation Age of Onset   Diabetes Mother    Depression Mother    Hypertension Mother    Hypertension Father    Hypertension Brother    Hypertension Brother    Breast cancer Neg Hx     Social History Social History   Tobacco Use   Smoking status: Every Day    Packs/day: 0.50    Years: 13.00    Pack years: 6.50    Types: Cigarettes    Start date: 01/06/1994    Last attempt to quit: 06/12/2018    Years since quitting: 2.6   Smokeless tobacco: Never   Tobacco comments:    smoking  hx Max 1 PPD; trying to quit, 06/2016, quit for 7 years  Vaping Use   Vaping Use: Never used  Substance Use Topics   Alcohol use: Yes    Alcohol/week: 0.0 standard drinks    Comment: occassionally   Drug use: No     Review of Systems  Constitutional: No fever/chills Eyes:  No discharge ENT: No upper respiratory complaints. Respiratory: no cough. No SOB/ use of accessory muscles to breath Gastrointestinal: Patient has epigastric abdominal discomfort. Musculoskeletal: Negative for musculoskeletal pain. Skin: Negative for rash, abrasions, lacerations, ecchymosis.   ____________________________________________   PHYSICAL EXAM:  VITAL SIGNS: ED Triage Vitals  Enc Vitals Group     BP 02/21/21 1330 (!) 170/119     Pulse Rate 02/21/21 1330 95     Resp 02/21/21 1330 20     Temp 02/21/21 1330 99 F (37.2 C)     Temp Source 02/21/21 1330 Oral     SpO2 02/21/21 1330 96 %     Weight --       Height --      Head Circumference --      Peak Flow --      Pain Score 02/21/21 1327 10     Pain Loc --      Pain Edu? --      Excl. in [place]? --      Constitutional: Alert and oriented. Well appearing and in no acute distress. Eyes: Conjunctivae are normal. PERRL. EOMI. Head: Atraumatic. ENT:      Nose: No congestion/rhinnorhea.      Mouth/Throat: Mucous membranes are moist.  Neck: No stridor.  No cervical spine tenderness to palpation. Cardiovascular: Normal rate,  regular rhythm. Normal S1 and S2.  Good peripheral circulation. Respiratory: Normal respiratory effort without tachypnea or retractions. Lungs CTAB. Good air entry to the bases with no decreased or absent breath sounds Gastrointestinal: Bowel sounds x 4 quadrants. Soft and nontender to palpation. No guarding or rigidity. No distention. Musculoskeletal: Full range of motion to all extremities. No obvious deformities noted Neurologic:  Normal for age. No gross focal neurologic deficits are appreciated.  Skin:  Skin is warm, dry and intact. No rash noted. Psychiatric: Mood and affect are normal for age. Speech and behavior are normal.   ____________________________________________   LABS (all labs ordered are listed, but only abnormal results are displayed)  Labs Reviewed - No data to display ____________________________________________  EKG   ____________________________________________  RADIOLOGY   No results found.  ____________________________________________    PROCEDURES  Procedure(s) performed:     Procedures     Medications - No data to display   ____________________________________________   INITIAL IMPRESSION / ASSESSMENT AND PLAN / ED COURSE  Pertinent labs & imaging results that were available during my care of the patient were reviewed by me and considered in my medical decision making (see chart for details).    Assessment and plan Epigastric abdominal  discomfort 46 year old female presents to the urgent care with epigastric abdominal discomfort, black tarry schools and complaints of chest discomfort.  Patient was hypertensive at triage but vital signs were otherwise reassuring.  She was alert, active and nontoxic-appearing.  She had no wheezes or rhonchi and had breath sounds the lung bases bilaterally.  Recommended further work-up in the emergency department setting given symptoms detailed in HPI.  Patient states that she has easy access to the emergency department and feels comfortable going POV.      ____________________________________________  FINAL CLINICAL IMPRESSION(S) / ED DIAGNOSES  Final diagnoses:  Abdominal discomfort      NEW MEDICATIONS STARTED DURING THIS VISIT:  ED Discharge Orders     None           This chart was dictated using voice recognition software/Dragon. Despite best efforts to proofread, errors can occur which can change the meaning. Any change was purely unintentional.     Lannie Fields, PA-C 02/21/21 1403

## 2021-02-21 NOTE — ED Provider Triage Note (Signed)
Emergency Medicine Provider Triage Evaluation Note  Katelyn [name] , a 46 y.o. female  was evaluated in triage.  On Saturday  she started feeling unwell and had epigastric/lower chest pain in the middle.  Her pain worsens with PO intake.  She reports water doesn't hurt, but when she tried to eat a salad it hurt.  She has been taking alkaselzer and pepto.  She did have right shoulder pain two days ago for under 5 minutes. She vomited once yesterday.   She has been taking her BP medications but ran out of her omeprazole a week ago.  Physical Exam  BP (!) 143/101 (BP Location: Left Arm)    Pulse 98    Temp 98.5 F (36.9 C) (Oral)    Resp 18    SpO2 100%  Gen:   Awake, no distress   Resp:  Normal effort  MSK:   Moves extremities without difficulty  Other:  Abdomen is tender to palpation in epigastric and right upper quadrant.  Minimal/no left upper quadrant tenderness to palpation.  Medical Decision Making  Medically screening exam initiated at 3:43 PM.  Appropriate orders placed.  Katelyn [name] was informed that the remainder of the evaluation will be completed by another provider, this initial triage assessment does not replace that evaluation, and the importance of remaining in the ED until their evaluation is complete.     Lorin Glass, [place] 02/21/21 1555

## 2021-02-21 NOTE — Discharge Instructions (Signed)
Please go to the Emergency Department for further care and management.

## 2021-02-21 NOTE — Discharge Instructions (Addendum)
Follow-up with your primary care doctor on Wednesday as planned.

## 2021-02-21 NOTE — ED Triage Notes (Signed)
Pt reports this weekend after eating she has pain to epigastric region (states it feels like food gets stuck there and won't move) LBM: this morning

## 2021-02-21 NOTE — ED Notes (Signed)
Patient is being discharged from the Urgent Care and sent to the Emergency Department via [place]. Per [employer], patient is in need of higher level of care due to need for further evaluation. Patient is aware and verbalizes understanding of plan of care.  Vitals:   02/21/21 1330 02/21/21 1332  BP: (!) 170/119 (!) 162/122  Pulse: 95   Resp: 20   Temp: 99 F (37.2 C)   SpO2: 96%

## 2021-02-22 NOTE — ED Provider Notes (Signed)
[facility] DEPT Provider Note   CSN: [id number] Arrival date & time: 02/21/21  1458     History  Chief Complaint  Patient presents with   Hypertension   Chest Pain    Katelyn [name] is a 46 y.o. female.   Hypertension Associated symptoms include chest pain and abdominal pain.  Chest Pain Associated symptoms: abdominal pain   Associated symptoms: no nausea, no vomiting and no weakness   Patient presents with chest pain and abdominal pain.  Epigastric pain that will sometimes go up to her chest.  Worsened by eating.  Worsens with certain foods.  Worsening with spicy foods and fatty foods.  Will go to the back at time.  Used to be on antacid medicine but not on anymore.  Is on blood pressure medicines and states been taking it.  States she is on Norvasc and has an appointment to see her primary care doctor in a few days.  No fevers or chills.  No cough.  Not exertional pain.    Home Medications Prior to Admission medications   Medication Sig Start Date End Date Taking? Authorizing Provider  amLODipine (NORVASC) 10 MG tablet Take 1 tablet (10 mg total) by mouth daily. Patient not taking: Reported on 08/07/2020 04/06/20   Tedd Sias, PA  Aspirin-Salicylamide-Caffeine (BC HEADACHE POWDER PO) Take 1 packet by mouth daily as needed (headache).    [provider]  atorvastatin (LIPITOR) 40 MG tablet TAKE ONE TABLET BY MOUTH DAILY Patient taking differently: Take 40 mg by mouth daily. 01/09/20   Benay Pike, MD  atorvastatin (LIPITOR) 80 MG tablet Take 80 mg by mouth daily.    [provider]  Calcium Carbonate Antacid (ALKA-SELTZER ANTACID PO) Take 1 tablet by mouth daily as needed (stomach acid).    [provider]  chlorthalidone (HYGROTON) 25 MG tablet Take 0.5 tablets (12.5 mg total) by mouth daily. 04/06/20   Tedd Sias, PA  diltiazem (DILACOR XR) 240 MG 24 hr capsule Take 240 mg by mouth daily.    [provider]  lisinopril (ZESTRIL) 10 MG tablet Take 10 mg by mouth daily.    [provider]  lisinopril (ZESTRIL) 40 MG tablet Take 1 tablet (40 mg total) by mouth daily. 04/06/20 05/06/20  Tedd Sias, PA  metoprolol succinate (TOPROL-XL) 50 MG 24 hr tablet Take 50 mg by mouth daily. Take with or immediately following a meal.    [provider]  omeprazole (PRILOSEC) 20 MG capsule Take 1 capsule (20 mg total) by mouth daily. 02/21/21   Davonna Belling, MD  spironolactone (ALDACTONE) 25 MG tablet TAKE HALF A TABLET BY MOUTH DAILY 04/06/20   Pati Gallo S, PA  triamterene-hydrochlorothiazide (MAXZIDE-25) 37.5-25 MG tablet Take 1 tablet by mouth daily.    [provider]  citalopram (CELEXA) 10 MG tablet Take 1 tablet (10 mg total) by mouth daily. 09/12/10 03/20/11  Gregor Hams, MD  potassium chloride SA (K-DUR,KLOR-CON) 20 MEQ tablet Take 1 tablet (20 mEq total) by mouth daily. For 7 days 08/21/10 03/20/11  Katherina Mires, MD      Allergies    Metoprolol    Review of Systems   Review of Systems  Constitutional:  Negative for appetite change.  Cardiovascular:  Positive for chest pain.  Gastrointestinal:  Positive for abdominal pain. Negative for nausea and vomiting.  Genitourinary:  Negative for flank pain.  Neurological:  Negative for weakness.   Physical Exam Updated  Vital Signs BP (!) 140/94    Pulse 73    Temp 98.5 F (36.9 C) (Oral)    Resp 17    SpO2 98%  Physical Exam Vitals and nursing note reviewed.  Cardiovascular:     Rate and Rhythm: Normal rate and regular rhythm.  Pulmonary:     Breath sounds: No decreased breath sounds or wheezing.  Chest:     Chest wall: No tenderness.  Abdominal:     Tenderness: There is abdominal tenderness.     Comments:  mild epigastric tenderness out rebound or guarding.  Musculoskeletal:     Cervical back: Neck supple.  Neurological:     Mental Status: She is alert.    ED Results / Procedures / Treatments    Labs (all labs ordered are listed, but only abnormal results are displayed) Labs Reviewed  COMPREHENSIVE METABOLIC PANEL - Abnormal; Notable for the following components:      Result Value   Creatinine, Ser 1.02 (*)    Total Protein 8.5 (*)    AST 55 (*)    ALT 49 (*)    All other components within normal limits  CBC WITH DIFFERENTIAL/PLATELET - Abnormal; Notable for the following components:   WBC 14.4 (*)    RBC 5.13 (*)    Hemoglobin 17.1 (*)    HCT 47.0 (*)    MCHC 36.4 (*)    Neutro Abs 9.5 (*)    Eosinophils Absolute 0.6 (*)    All other components within normal limits  LIPASE, BLOOD  URINALYSIS, ROUTINE W REFLEX MICROSCOPIC  TROPONIN I (HIGH SENSITIVITY)  TROPONIN I (HIGH SENSITIVITY)    EKG EKG Interpretation  Date/Time:  Thursday February 21 2021 20:24:22 EST Ventricular Rate:  68 PR Interval:  227 QRS Duration: 95 QT Interval:  426 QTC Calculation: 454 R Axis:   -54 Text Interpretation: Sinus rhythm Prolonged PR interval Abnormal R-wave progression, early transition Probable left ventricular hypertrophy No significant change since last tracing Confirmed by Davonna Belling [phone]) on 02/22/2021 12:11:37 AM  Radiology DG Chest 2 View  Result Date: 02/21/2021 CLINICAL DATA:  Intermittent chest pain. EXAM: CHEST - 2 VIEW COMPARISON:  Chest x-ray dated April 06, 2020. FINDINGS: The heart size and mediastinal contours are within normal limits. Both lungs are clear. The visualized skeletal structures are unremarkable. IMPRESSION: No active cardiopulmonary disease. Electronically Signed   By: Titus Dubin M.D.   On: 02/21/2021 16:30   US Abdomen Limited RUQ (LIVER/GB)  Result Date: 02/21/2021 CLINICAL DATA:  Right upper quadrant abdominal tenderness EXAM: ULTRASOUND ABDOMEN LIMITED RIGHT UPPER QUADRANT COMPARISON:  11/25/2014 FINDINGS: Gallbladder: No gallstones or wall thickening visualized. No sonographic Murphy sign noted by sonographer. Common bile duct:  Diameter: 4 mm Liver: Diffuse increased liver echotexture without focal parenchymal abnormality. No intrahepatic duct dilation. Portal vein is patent on color Doppler imaging with normal direction of blood flow towards the liver. Other: None. IMPRESSION: 1. Increased liver echotexture consistent with hepatic steatosis. 2. No evidence of cholelithiasis or cholecystitis. Electronically Signed   By: Randa Ngo M.D.   On: 02/21/2021 20:25    Procedures Procedures    Medications Ordered in ED Medications - No data to display  ED Course/ Medical Decision Making/ A&P                           Medical Decision Making  Patient with epigastric pain and chest pain.  Initial differential diagnosis is broad and  includes life-threatening conditions such as coronary artery disease and biliary problem.  Patient with chest pain.  Epigastric pain.  Lab work reassuring.  LFTs just mildly elevated.  Ultrasound done and reassuring.  Does have liver showing hepatic steatosis.  No cholelithiasis does also drink alcohol heavily.  Potentially could be a cause.  Potentially could have GERD or gastritis.  Does not appear to be a cardiac cause of the pain.  EKG reassuring.  Low risk cardiac.  Negative troponin.  Lab work otherwise near baseline.  Discharge home with GI follow-up.  Has not seen GI in the past        Final Clinical Impression(s) / ED Diagnoses Final diagnoses:  RUQ abdominal tenderness  Epigastric pain  Hypertension, unspecified type    Rx / DC Orders ED Discharge Orders          Ordered    omeprazole (PRILOSEC) 20 MG capsule  Daily        02/21/21 2127              Davonna Belling, MD 02/22/21 0013

## 2021-02-27 ENCOUNTER — Other Ambulatory Visit: Payer: Self-pay

## 2021-02-27 ENCOUNTER — Encounter: Payer: Self-pay | Admitting: Student

## 2021-02-27 ENCOUNTER — Ambulatory Visit (INDEPENDENT_AMBULATORY_CARE_PROVIDER_SITE_OTHER): Payer: PRIVATE HEALTH INSURANCE | Admitting: Student

## 2021-02-27 VITALS — BP 119/82 | HR 72 | Temp 98.6°F | Wt 173.9 lb

## 2021-02-27 DIAGNOSIS — I10 Essential (primary) hypertension: Secondary | ICD-10-CM | POA: Diagnosis not present

## 2021-02-27 DIAGNOSIS — Z833 Family history of diabetes mellitus: Secondary | ICD-10-CM | POA: Diagnosis not present

## 2021-02-27 DIAGNOSIS — N63 Unspecified lump in unspecified breast: Secondary | ICD-10-CM

## 2021-02-27 DIAGNOSIS — F17200 Nicotine dependence, unspecified, uncomplicated: Secondary | ICD-10-CM

## 2021-02-27 DIAGNOSIS — Z1211 Encounter for screening for malignant neoplasm of colon: Secondary | ICD-10-CM

## 2021-02-27 DIAGNOSIS — Z1159 Encounter for screening for other viral diseases: Secondary | ICD-10-CM | POA: Diagnosis not present

## 2021-02-27 DIAGNOSIS — F411 Generalized anxiety disorder: Secondary | ICD-10-CM

## 2021-02-27 DIAGNOSIS — K219 Gastro-esophageal reflux disease without esophagitis: Secondary | ICD-10-CM

## 2021-02-27 DIAGNOSIS — I1A0 Resistant hypertension: Secondary | ICD-10-CM

## 2021-02-27 DIAGNOSIS — E782 Mixed hyperlipidemia: Secondary | ICD-10-CM

## 2021-02-27 DIAGNOSIS — Z0000 Encounter for general adult medical examination without abnormal findings: Secondary | ICD-10-CM

## 2021-02-27 LAB — POCT GLYCOSYLATED HEMOGLOBIN (HGB A1C): Hemoglobin A1C: 5.4 % (ref 4.0–5.6)

## 2021-02-27 LAB — GLUCOSE, CAPILLARY: Glucose-Capillary: 101 mg/dL — ABNORMAL HIGH (ref 70–99)

## 2021-02-27 MED ORDER — HYDROXYZINE HCL 25 MG PO TABS: 25.0000 mg | ORAL_TABLET | Freq: Four times a day (QID) | ORAL | 2 refills | Status: DC | PRN

## 2021-02-27 MED ORDER — OMEPRAZOLE 20 MG PO CPDR: 20.0000 mg | DELAYED_RELEASE_CAPSULE | Freq: Every day | ORAL | 2 refills | Status: DC

## 2021-02-27 MED ORDER — ATORVASTATIN CALCIUM 80 MG PO TABS: 80.0000 mg | ORAL_TABLET | Freq: Every day | ORAL | 1 refills | Status: DC

## 2021-02-27 MED ORDER — LISINOPRIL 10 MG PO TABS: 10.0000 mg | ORAL_TABLET | Freq: Every day | ORAL | 2 refills | Status: DC

## 2021-02-27 MED ORDER — METOPROLOL SUCCINATE ER 50 MG PO TB24: 50.0000 mg | ORAL_TABLET | Freq: Every day | ORAL | 2 refills | Status: DC

## 2021-02-27 MED ORDER — TRIAMTERENE-HCTZ 37.5-25 MG PO TABS: 1.0000 | ORAL_TABLET | Freq: Every day | ORAL | 2 refills | Status: DC

## 2021-02-27 NOTE — Assessment & Plan Note (Signed)
Patient reports a diagnosis of anxiety disorder.  On chart review, there is potential panic disorder element to this as well. She reports that she overthink any problems in her life which makes her more anxious. She is not currently on any treatment for this. GAD-7 of 17 today.  We will trial patient on as needed antianxiety medication and monitor for improvement with GAD-7. Will consider SSRIs if patient's symptoms become persistent.  Plan: -- Start hydroxyzine 25 mg every 6 hours as needed for anxiety -- Referral to Dr. Theodis Shove for CBT -- Follow-up in 4 weeks

## 2021-02-27 NOTE — Patient Instructions (Signed)
Thank you, Ms.Veneda Melter Yim for allowing [place] to provide your care today. Today we discussed your recent ER visit.  We have established you in our clinic and we will get some more records from [facility].  Continue taking medications as prescribed.  I have started you on anxiety medication to take when you are anxious.  I will also refer you to our counselor.   I have ordered the following labs for you:  Lab Orders         BMP8+Anion Gap         Hepatitis C Ab reflex to Quant PCR         Lipid Profile         POC Hbg A1C      I will call if any are abnormal. All of your labs can be accessed through "My Chart".  I have place a referrals to GI and therapy with Dr. Theodis Shove   I have ordered the following medication/changed the following medications:  Start hydroxyzine 25 mg every 6 hours as needed for anxiety  My Chart Access: [email or web address]  Please follow-up in 4 weeks  Please make sure to arrive 15 minutes prior to your next appointment. If you arrive late, you may be asked to reschedule.    We look forward to seeing you next time. Please call our clinic at [phone] if you have any questions or concerns. The best time to call is Monday-Friday from 9am-4pm, but there is someone available 24/7. If after hours or the weekend, call the main hospital number and ask for the Internal Medicine Resident On-Call. If you need medication refills, please notify your pharmacy one week in advance and they will send [place] a request.   Thank you for letting us take part in your care. Wishing you the best!  Lacinda Axon, MD 02/27/2021, 10:42 AM IM Resident, PGY-2 [facility] 41:10

## 2021-02-27 NOTE — Assessment & Plan Note (Addendum)
Patient with a history of resistant hypertension here to establish care after unable to continue medical care at [facility] due to her insurance.  Patient has a history of hypertensive urgency requiring multiple visits to the ER since 2014.  She has been placed on multiple antihypertensive agents including spironolactone, chlorthalidone, lisinopril, metoprolol and triamterene-HCTZ.  Patient's current regimen includes lisinopril 10 mg daily, triamterene-HCTZ 37.5-25 mg daily, metoprolol 50 mg daily and diltiazem 240 mg daily.  Patient's BP is currently well controlled with her current regimen.  SBP improved from the 120s to 110s with repeat BP. Vitals:   02/27/21 0940 02/27/21 1034  BP: (!) 129/96 119/82   Plan: --Continue lisinopril 10 mg daily, triamterene-HCTZ 37.5-25 mg daily, metoprolol 50 mg daily and diltiazem 240 mg daily. --Requesting medical records from [facility]. --Follow-up BMP --Office follow-up in 4 weeks  Addendum: BMP showed normal kidney functions with no electrolyte abnormalities.  Patient has been updated about her lab results.

## 2021-02-27 NOTE — Progress Notes (Signed)
° °  CC: To establish care  HPI:  [name] is a 46 y.o. female with PMH as below who presents to the clinic as a patient from [place] reestablish care with our practice. Please see problem based charting for evaluation, assessment and plan.  Past Medical History:  Diagnosis Date   Abdominal pain, RLQ 06/23/2014   AKI (acute kidney injury) ([place]) 01/14/2015   Anemia    Anxiety    Chest pain    Depression    Elevated LFTs 04/26/2012   Hypertension    Hypertensive urgency 06/13/2018   Migraine    Palpitations    Allergies: NKDA  Surgical history: Hysteroscopy with endometrial ablation in 2020, laparoscopy in 2020, tubal ligation in 2006.  Family history: History of diabetes in mother. History of heart disease in father. History of hypertension in mother, father and twin brothers. History of lung cancer in great aunt.  Social history: Patient lives at home with her current husband. She has 3 children (2 boys, 66 and 1) and 1 girl who is 16. She works as a Associate Professor at [employer]. She has 18-pack-year history of tobacco use currently smoking about half a pack per day. She has 1-2 drinks per day. She denies any illicit drug use.  Review of Systems:  Constitutional: Negative for fever, weight changes or fatigue Eyes: Negative for visual changes Respiratory: Negative for shortness of breath Cardiac: Negative for chest pain MSK: Negative for back pain Abdomen: Positive for occasional epigastric pain. Negative for constipation, nausea, vomiting or diarrhea Neuro: Negative for headache, dizziness or weakness Psych: Positive for anxiety  Physical Exam: General: Pleasant, well-appearing middle-age Black woman.  No acute distress. HEENT: MMM. PERRLA. No scleral icterus. Cardiac: RRR. No murmurs, rubs or gallops. No LE edema Respiratory: Lungs CTAB. No wheezing or crackles. Abdominal: Soft, symmetric and non tender. Normal BS. Skin: Warm, dry and intact without  rashes or lesions Extremities: Atraumatic. Full ROM. Radial and DP pulses 2+ and symmetric. Neuro: A&O x 3. Moves all extremities.  Normal sensation to gross touch. Psych: Appropriate mood and affect.  Vitals:   02/27/21 0940 02/27/21 1034  BP: (!) 129/96 119/82  Pulse: 87 72  Temp: 98.6 F (37 C)   TempSrc: Oral   SpO2: 100%   Weight: 173 lb 14.4 oz (78.9 kg)     Assessment & Plan:   See Encounters Tab for problem based charting.  Patient discussed with Dr. Emelia Salisbury, MD, MPH

## 2021-02-27 NOTE — Assessment & Plan Note (Signed)
Patient states she has been smoking cigarettes since the age of 46. She was able to quit for period of 7 years during her first marriage. She started smoking again after her marriage fell apart. She has smoked 1 pack/day for 18 years and continues to smoke. She is currently down to half a pack a day but not ready to quit at the moment. -- Continue to revisit readiness to quit smoking at next office visit

## 2021-02-27 NOTE — Assessment & Plan Note (Addendum)
Patient currently on atorvastatin 80 mg daily.  No recent lipid panel on file. -- Follow-up repeat lipid panel -- Refilled atorvastatin 80 mg daily  Addendum: Lipid panel shows T cholesterol of 279, triglyceride of 574, LDL 100 and HDL 52.  Patient updated on results and would like to try lifestyle modifications.  Patient counseled on decreasing her fat intake, eating foods high in omega-3 fatty acids, limiting her intake of fried foods and eating more vegetables and whole grains. Patient also instructed to get at least 30 minutes of moderate exercise 5 times per week.  Patient also informed that if she is unable to bring down her triglycerides with lifestyle modifications, we will likely start her on a second agent such as fibrate.  -- Instructed to get fish oil w/ omega-3 fatty acid supplements over-the-counter -- Lifestyle modifications with dietary changes and exercise -- Follow-up in about 4 weeks, please assess adherence to this plan.

## 2021-02-27 NOTE — Assessment & Plan Note (Signed)
No mammographic evidence of malignancy on mammography on 08/2020. -- Continue yearly mammography

## 2021-02-28 ENCOUNTER — Encounter: Payer: Self-pay | Admitting: Student

## 2021-02-28 DIAGNOSIS — Z0000 Encounter for general adult medical examination without abnormal findings: Secondary | ICD-10-CM | POA: Insufficient documentation

## 2021-02-28 DIAGNOSIS — K219 Gastro-esophageal reflux disease without esophagitis: Secondary | ICD-10-CM | POA: Insufficient documentation

## 2021-02-28 DIAGNOSIS — Z1211 Encounter for screening for malignant neoplasm of colon: Secondary | ICD-10-CM | POA: Insufficient documentation

## 2021-02-28 LAB — LIPID PANEL
Chol/HDL Ratio: 4.8 ratio — ABNORMAL HIGH (ref 0.0–4.4)
Cholesterol, Total: 249 mg/dL — ABNORMAL HIGH (ref 100–199)
HDL: 52 mg/dL (ref >39)
LDL Chol Calc (NIH): 100 mg/dL — ABNORMAL HIGH (ref 0–99)
Triglycerides: 574 mg/dL (ref 0–149)
VLDL Cholesterol Cal: 97 mg/dL — ABNORMAL HIGH (ref 5–40)

## 2021-02-28 LAB — BMP8+ANION GAP
Anion Gap: 18 mmol/L (ref 10.0–18.0)
BUN/Creatinine Ratio: 12 (ref 9–23)
BUN: 11 mg/dL (ref 6–24)
CO2: 22 mmol/L (ref 20–29)
Calcium: 10.4 mg/dL — ABNORMAL HIGH (ref 8.7–10.2)
Chloride: 100 mmol/L (ref 96–106)
Creatinine, Ser: 0.9 mg/dL (ref 0.57–1.00)
Glucose: 91 mg/dL (ref 70–99)
Potassium: 4.4 mmol/L (ref 3.5–5.2)
Sodium: 140 mmol/L (ref 134–144)
eGFR: 80 mL/min/{1.73_m2} (ref >59)

## 2021-02-28 LAB — HCV AB W REFLEX TO QUANT PCR: HCV Ab: NONREACTIVE

## 2021-02-28 LAB — HCV INTERPRETATION

## 2021-02-28 NOTE — Assessment & Plan Note (Addendum)
Negative hep C screening. Referral sent to GI for colonoscopy.

## 2021-02-28 NOTE — Assessment & Plan Note (Signed)
Patient was recently evaluated in the ER on 2/16 for epigastric pain. Work-up overall was unrevealing.  ACS was ruled out. RUQ ultrasound showed hepatic steatosis but no other intra-abdominal pathology. Patient was discharged home on omeprazole and instructed to follow-up with GI.  Today, patient reports that her epigastric pain has resolved.  Patient currently due for colonoscopy.  Plan: -- Refill omeprazole 20 mg daily -- Continue as needed antacid -- Referral to GI for evaluation and colonoscopy

## 2021-03-01 NOTE — Progress Notes (Signed)
Internal Medicine Clinic Attending  Case discussed with Dr. Amponsah  At the time of the visit.  We reviewed the resident's history and exam and pertinent patient test results.  I agree with the assessment, diagnosis, and plan of care documented in the resident's note.  

## 2021-03-19 ENCOUNTER — Encounter: Payer: Self-pay | Admitting: Student

## 2021-03-27 ENCOUNTER — Encounter: Payer: Self-pay | Admitting: Student

## 2021-03-27 ENCOUNTER — Other Ambulatory Visit: Payer: Self-pay

## 2021-03-27 ENCOUNTER — Ambulatory Visit (INDEPENDENT_AMBULATORY_CARE_PROVIDER_SITE_OTHER): Payer: 59 | Admitting: Student

## 2021-03-27 VITALS — BP 134/96 | HR 89 | Temp 98.2°F | Ht 68.0 in | Wt 177.0 lb

## 2021-03-27 DIAGNOSIS — K219 Gastro-esophageal reflux disease without esophagitis: Secondary | ICD-10-CM | POA: Diagnosis not present

## 2021-03-27 DIAGNOSIS — R109 Unspecified abdominal pain: Secondary | ICD-10-CM

## 2021-03-27 MED ORDER — PANTOPRAZOLE SODIUM 40 MG PO TBEC: 40.0000 mg | DELAYED_RELEASE_TABLET | Freq: Every day | ORAL | 1 refills | Status: DC

## 2021-03-27 NOTE — Patient Instructions (Signed)
[name], it was a pleasure seeing you today! ? ?Today we discussed: ?- I would like for you to switch from omeprazole to pantoprazole. Continue taking this until you no longer have the pain. You can also take Alka-Seltzer/Tums to also help. ? ?- After your symptoms have stopped, please give [place] a call and stop taking pantoprazole. I would like to test you for a bacteria that could be causing your symptoms. You need to be off of your medication for two weeks. Once you have been off of this, we will schedule for a lab appointment. ? ?I have ordered the following labs today: ? ? ?Lab Orders    ?     H. pylori breath test     ? ?I have ordered the following medication/changed the following medications:  ? ?Stop the following medications: ?Medications Discontinued During This Encounter  ?Medication Reason  ? omeprazole (PRILOSEC) 20 MG capsule   ?  ? ?Start the following medications: ?Meds ordered this encounter  ?Medications  ? pantoprazole (PROTONIX) 40 MG tablet  ?  Sig: Take 1 tablet (40 mg total) by mouth daily.  ?  Dispense:  30 tablet  ?  Refill:  1  ?  ? ?Follow-up:  1 month   ? ?Please make sure to arrive 15 minutes prior to your next appointment. If you arrive late, you may be asked to reschedule.  ? ?We look forward to seeing you next time. Please call our clinic at [phone] if you have any questions or concerns. The best time to call is Monday-Friday from 9am-4pm, but there is someone available 24/7. If after hours or the weekend, call the main hospital number and ask for the Internal Medicine Resident On-Call. If you need medication refills, please notify your pharmacy one week in advance and they will send [place] a request. ? ?Thank you for letting us take part in your care. Wishing you the best! ? ?Thank you, ?Sanjuan Dame, MD ? ?

## 2021-03-28 NOTE — Assessment & Plan Note (Signed)
Patient is returning to clinic today to discuss abdominal pain. Per chart review, patient was evaluated 02/21/21 in ED for epigastric abdominal pain and was discharged with omeprazole. Katelyn [name] reports her symptoms initially went away after taking this. Over the last week or so patient reports her abdominal pain has returned. Describes the pain as achy, sharp, and worse after eating. No association with exertion. She does report some heartburn, but otherwise denies fevers, chills, dyspnea, chest pain, nausea, vomiting, or change in bowel movements. Katelyn [name] denies eating spicy foods, but does report alcohol use. She also denies weight changes, night sweats, melena, or bright red blood.  ? ?Patient's symptoms most consistent with GERD. Given she has failed trial of omeprazole, will switch to Protonix. In addition, given recurrent symptoms will test for H. Pylori. I have also given her instructions to reduce alcohol intake to reduce symptoms. Patient will need to be off of her PPI for at least two weeks for the H. pylori breath test. We will have her continue with protonix until symptoms have resolved. At that time patient is to stop taking Protonix and use over the counter Akla-Seltzer/calcium carbonate to control symptoms until the test is done. I have given these instructions to Katelyn [name], who verbalizes understanding.  ? ?- STOP omeprazole ?- Start pantoprazole '40mg'$  daily ?- Continue calcium carbonate as needed ?- H. pylori breath test ordered ?- Patient is to call in when symptoms stop. At that point, hold PPI and schedule for lab-only appointment in two weeks. ?

## 2021-03-28 NOTE — Progress Notes (Signed)
? ?  CC: abdominal pain ? ?HPI: ? ?Katelyn [name] is a 46 y.o. person with medical history as below presenting to [facility] for abdominal pain. ? ?Please see problem-based list for further details, assessments, and plans. ? ?Past Medical History:  ?Diagnosis Date  ? Abdominal pain, RLQ 06/23/2014  ? AKI (acute kidney injury) ([place]) 01/14/2015  ? Anemia   ? Anxiety   ? Chest pain   ? Depression   ? Elevated LFTs 04/26/2012  ? Hypertension   ? Hypertensive urgency 06/13/2018  ? Migraine   ? Palpitations   ? ?Review of Systems:  As per HPI ? ?Physical Exam: ? ?Vitals:  ? 03/27/21 1532 03/27/21 1541  ?BP: (!) 139/107 (!) 134/96  ?Pulse: 98 89  ?Temp: 98.2 ?F (36.8 ?C)   ?TempSrc: Oral   ?SpO2: 100%   ?Weight: 177 lb (80.3 kg)   ?Height: '5\' 8"'$  (1.727 m)   ? ?General: Resting comfortably in no acute distress ?CV: Regular rate, rhythm. No murmurs appreciated ?Pulm: Normal respiratory effort on room air. Clear to ausculation bilaterally. ?GI: Abdomen soft, non-distended, non-tender. Normoactive bowel sounds. ?MSK: Normal bulk, tone. No lower extremity edema bilaterally. ?Neuro: Awake, alert, conversing appropriately. ?Psych: Normal mood, affect, speech. ? ?Assessment & Plan:  ? ?GERD (gastroesophageal reflux disease) ?Patient is returning to clinic today to discuss abdominal pain. Per chart review, patient was evaluated 02/21/21 in ED for epigastric abdominal pain and was discharged with omeprazole. Ms. Cage reports her symptoms initially went away after taking this. Over the last week or so patient reports her abdominal pain has returned. Describes the pain as achy, sharp, and worse after eating. No association with exertion. She does report some heartburn, but otherwise denies fevers, chills, dyspnea, chest pain, nausea, vomiting, or change in bowel movements. [name] denies eating spicy foods, but does report alcohol use. She also denies weight changes, night sweats, melena, or bright red blood.  ? ?Patient's symptoms most  consistent with GERD. Given she has failed trial of omeprazole, will switch to Protonix. In addition, given recurrent symptoms will test for H. Pylori. I have also given her instructions to reduce alcohol intake to reduce symptoms. Patient will need to be off of her PPI for at least two weeks for the H. pylori breath test. We will have her continue with protonix until symptoms have resolved. At that time patient is to stop taking Protonix and use over the counter Akla-Seltzer/calcium carbonate to control symptoms until the test is done. I have given these instructions to Ms. Ergle, who verbalizes understanding.  ? ?- STOP omeprazole ?- Start pantoprazole '40mg'$  daily ?- Continue calcium carbonate as needed ?- H. pylori breath test ordered ?- Patient is to call in when symptoms stop. At that point, hold PPI and schedule for lab-only appointment in two weeks. ? ?Patient discussed with Dr. Dareen Piano ? ?Sanjuan Dame, MD ?Internal Medicine PGY-2 ?Pager: [phone] ? ?

## 2021-03-29 LAB — HM PAP SMEAR: HPV, high-risk: NEGATIVE

## 2021-03-29 NOTE — Progress Notes (Signed)
Internal Medicine Clinic Attending ? ?Case discussed with Dr. Braswell  At the time of the visit.  We reviewed the resident?s history and exam and pertinent patient test results.  I agree with the assessment, diagnosis, and plan of care documented in the resident?s note.  ?

## 2021-04-02 ENCOUNTER — Telehealth: Payer: Self-pay | Admitting: Behavioral Health

## 2021-04-02 ENCOUNTER — Institutional Professional Consult (permissible substitution): Payer: 59 | Admitting: Behavioral Health

## 2021-04-02 NOTE — Telephone Encounter (Signed)
Contacted Pt 4 times, twice on ea avail phone contact. Lft msg on [phone] for Pt, unable to lv msg on the addt'l number as provided by Pt [phone]) due to phone continuing to ring w/o ans. ? ?Dr. Theodis Shove ?

## 2021-04-03 ENCOUNTER — Encounter: Payer: Self-pay | Admitting: Gastroenterology

## 2021-04-19 ENCOUNTER — Encounter: Payer: Self-pay | Admitting: Gastroenterology

## 2021-04-19 ENCOUNTER — Ambulatory Visit (INDEPENDENT_AMBULATORY_CARE_PROVIDER_SITE_OTHER): Payer: Self-pay | Admitting: Gastroenterology

## 2021-04-19 ENCOUNTER — Other Ambulatory Visit (INDEPENDENT_AMBULATORY_CARE_PROVIDER_SITE_OTHER): Payer: Self-pay

## 2021-04-19 ENCOUNTER — Other Ambulatory Visit: Payer: Self-pay

## 2021-04-19 VITALS — BP 142/88 | HR 96 | Ht 68.0 in | Wt 175.2 lb

## 2021-04-19 DIAGNOSIS — R1013 Epigastric pain: Secondary | ICD-10-CM

## 2021-04-19 DIAGNOSIS — K760 Fatty (change of) liver, not elsewhere classified: Secondary | ICD-10-CM

## 2021-04-19 LAB — H. PYLORI ANTIBODY, IGG: H Pylori IgG: POSITIVE — AB

## 2021-04-19 MED ORDER — DOXYCYCLINE HYCLATE 100 MG PO CAPS
100.0000 mg | ORAL_CAPSULE | Freq: Two times a day (BID) | ORAL | 0 refills | Status: AC
Start: 2021-04-19 — End: 2021-05-03

## 2021-04-19 MED ORDER — METRONIDAZOLE 500 MG PO TABS: 500.0000 mg | ORAL_TABLET | Freq: Three times a day (TID) | ORAL | 0 refills | Status: AC

## 2021-04-19 MED ORDER — BISMUTH SUBSALICYLATE 262 MG PO TABS: 524.0000 mg | ORAL_TABLET | Freq: Four times a day (QID) | ORAL | 0 refills | Status: AC

## 2021-04-19 NOTE — Progress Notes (Signed)
? ? ?[facility] Consult Note: ? ?History: ?Katelyn [name] ?04/19/2021 ? ?Referring provider: Lacinda Axon, MD ? ?Reason for consult/chief complaint: Gastroesophageal Reflux and Colonoscopy (Patient wants to discuss colon. Patient has never had a colon in the past. ) ? ? ?Subjective  ?HPI: ? ?This is a very pleasant 46 year old woman referred by medical resident clinic for epigastric pain. ? ?Katelyn [name] was seen in primary care clinic last month after an ED visit in February for epigastric pain.  Symptoms were felt consistent with GERD, did not improve on PPI and was switched to pantoprazole.  UBT ordered, but she has not yet done it. ? ?She describes a few months of dull upper abdominal discomfort that is usually bandlike and may be worse after foods and definitely after alcohol use.  Sometimes she feels that discomfort rising to her chest and it is nonexertional.  She denies dysphagia or odynophagia.  Bowel movements frequent and occasionally loose, no blood.  Intermittent nausea, no vomiting or weight loss.  She takes Pepto-Bismol sometimes and that is made her stool dark.  She also wish to discuss screening colonoscopy ? ?She has had no antibiotics or travel in recent months.  Lives at home with her 3 children and husband.  Her ex-husband and father of her children recently died and this has been a great stress on them. ? ?ROS: ? ?Review of Systems  ?Constitutional:  Negative for appetite change and unexpected weight change.  ?HENT:  Negative for mouth sores and voice change.   ?Eyes:  Negative for pain and redness.  ?Respiratory:  Negative for cough and shortness of breath.   ?Cardiovascular:  Negative for chest pain and palpitations.  ?Genitourinary:  Negative for dysuria and hematuria.  ?Musculoskeletal:  Negative for arthralgias and myalgias.  ?Skin:  Negative for pallor and rash.  ?Neurological:  Negative for weakness and headaches.  ?Hematological:  Negative for adenopathy.   ?Psychiatric/Behavioral:    ?     Self-reported anxiety  ? ? ?Past Medical History: ?Past Medical History:  ?Diagnosis Date  ? Abdominal pain, RLQ 06/23/2014  ? AKI (acute kidney injury) ([place]) 01/14/2015  ? Anemia   ? Anxiety   ? Chest pain   ? Depression   ? Elevated LFTs 04/26/2012  ? Hypertension   ? Hypertensive urgency 06/13/2018  ? Migraine   ? Palpitations   ? ? ? ?Past Surgical History: ?Past Surgical History:  ?Procedure Laterality Date  ? HYSTEROSCOPY W/ ENDOMETRIAL ABLATION  2010  ? LAPAROSCOPY N/A 11/25/2014  ? Procedure: LAPAROSCOPY OPERATIVE WITH LEFT OVARIAN CYSTECTOMY;  Surgeon: Osborne Oman, MD;  Location: [facility];  Service: Gynecology;  Laterality: N/A;  ? TUBAL LIGATION  2006  ? ? ? ?Family History: ?Family History  ?Problem Relation Age of Onset  ? Diabetes Mother   ? Depression Mother   ? Hypertension Mother   ? Hypertension Father   ? Hypertension Brother   ? Hypertension Brother   ? Breast cancer Neg Hx   ? Colon cancer Neg Hx   ? Stomach cancer Neg Hx   ? Esophageal cancer Neg Hx   ? ? ?Social History: ?Social History  ? ?Socioeconomic History  ? Marital status: Married  ?  Spouse name: Not on file  ? Number of children: 3  ? Years of education: Not on file  ? Highest education level: 11th grade  ?Occupational History  ? Occupation: Health visitor  ?Tobacco Use  ? Smoking status: Every Day  ?  Packs/day: 0.50  ?  Years: 13.00  ?  Pack years: 6.50  ?  Types: Cigarettes  ?  Start date: 01/06/1994  ?  Last attempt to quit: 06/12/2018  ?  Years since quitting: 2.8  ? Smokeless tobacco: Never  ? Tobacco comments:  ?  smoking  hx Max 1 PPD; trying to quit, 06/2016, quit for 7 years  ?Vaping Use  ? Vaping Use: Never used  ?Substance and Sexual Activity  ? Alcohol use: Yes  ?  Alcohol/week: 0.0 standard drinks  ?  Comment: occassionally  ? Drug use: No  ? Sexual activity: Yes  ?  Birth control/protection: Surgical  ?Other Topics Concern  ? Not on file  ?Social History Narrative  ? Not on file  ? ?Social  Determinants of Health  ? ?Financial Resource Strain: Not on file  ?Food Insecurity: No Food Insecurity  ? Worried About Charity fundraiser in the Last Year: Never true  ? Ran Out of Food in the Last Year: Never true  ?Transportation Needs: No Transportation Needs  ? Lack of Transportation (Medical): No  ? Lack of Transportation (Non-Medical): No  ?Physical Activity: Not on file  ?Stress: Not on file  ?Social Connections: Not on file  ? ?She still smokes occasionally ?Reports that she is safe at work and at home ? ?Allergies: ?Allergies  ?Allergen Reactions  ? Metoprolol Nausea Only  ?  Makes her feel "very sick" when taking med  ? ? ?Outpatient Meds: ?Current Outpatient Medications  ?Medication Sig Dispense Refill  ? atorvastatin (LIPITOR) 80 MG tablet Take 1 tablet (80 mg total) by mouth daily. 90 tablet 1  ? Calcium Carbonate Antacid (ALKA-SELTZER ANTACID PO) Take 1 tablet by mouth daily as needed (stomach acid).    ? diltiazem (DILACOR XR) 240 MG 24 hr capsule Take 240 mg by mouth daily.    ? hydrOXYzine (ATARAX) 25 MG tablet Take 1 tablet (25 mg total) by mouth every 6 (six) hours as needed for anxiety. 30 tablet 2  ? lisinopril (ZESTRIL) 10 MG tablet Take 1 tablet (10 mg total) by mouth daily. 60 tablet 2  ? pantoprazole (PROTONIX) 40 MG tablet Take 1 tablet (40 mg total) by mouth daily. 30 tablet 1  ? triamterene-hydrochlorothiazide (MAXZIDE-25) 37.5-25 MG tablet Take 1 tablet by mouth daily. 60 tablet 2  ? ?No current facility-administered medications for this visit.  ? ? ? ? ?___________________________________________________________________ ?Objective  ? ?Exam: ? ?BP (!) 142/88   Pulse 96   Ht '5\' 8"'$  (1.727 m)   Wt 175 lb 3.2 oz (79.5 kg)   SpO2 98%   BMI 26.64 kg/m?  ?Wt Readings from Last 3 Encounters:  ?04/19/21 175 lb 3.2 oz (79.5 kg)  ?03/27/21 177 lb (80.3 kg)  ?02/27/21 173 lb 14.4 oz (78.9 kg)  ? ? ?General: Well-appearing, pleasant and conversational ?Eyes: sclera anicteric, no redness ?ENT:  oral mucosa moist without lesions, no cervical or supraclavicular lymphadenopathy ?CV: RRR without murmur, S1/S2, no JVD, no peripheral edema ?Resp: clear to auscultation bilaterally, normal RR and effort noted ?GI: soft, no tenderness, with active bowel sounds. No guarding or palpable organomegaly noted.  No bruit ?Skin; warm and dry, no rash or jaundice noted ?Neuro: awake, alert and oriented x 3. Normal gross motor function and fluent speech ? ?Labs: ? ? ?  Latest Ref Rng & Units 02/21/2021  ?  4:00 PM 04/06/2020  ?  4:53 PM 02/08/2020  ?  9:56 AM  ?CBC  ?  WBC 4.0 - 10.5 K/uL 14.4   16.7   14.6    ?Hemoglobin 12.0 - 15.0 g/dL 17.1   15.7   14.9    ?Hematocrit 36.0 - 46.0 % 47.0   44.6   41.9    ?Platelets 150 - 400 K/uL 248   282   236    ? ? ?  Latest Ref Rng & Units 02/27/2021  ? 11:05 AM 02/21/2021  ?  4:00 PM 04/06/2020  ?  4:53 PM  ?CMP  ?Glucose 70 - 99 mg/dL 91   99   121    ?BUN 6 - 24 mg/dL '11   18   10    '$ ?Creatinine 0.57 - 1.00 mg/dL 0.90   1.02   0.80    ?Sodium 134 - 144 mmol/L 140   136   136    ?Potassium 3.5 - 5.2 mmol/L 4.4   3.6   3.4    ?Chloride 96 - 106 mmol/L 100   102   105    ?CO2 20 - 29 mmol/L '22   23   22    '$ ?Calcium 8.7 - 10.2 mg/dL 10.4   9.6   9.2    ?Total Protein 6.5 - 8.1 g/dL  8.5     ?Total Bilirubin 0.3 - 1.2 mg/dL  0.8     ?Alkaline Phos 38 - 126 U/L  88     ?AST 15 - 41 U/L  55     ?ALT 0 - 44 U/L  49     ? ? ?Also, total cholesterol 249, triglyceride 574 ?Radiologic Studies: ?CLINICAL DATA:  Right upper quadrant abdominal tenderness ?  ?EXAM: ?ULTRASOUND ABDOMEN LIMITED RIGHT UPPER QUADRANT ?  ?COMPARISON:  11/25/2014 ?  ?FINDINGS: ?Gallbladder: ?  ?No gallstones or wall thickening visualized. No sonographic Percell Miller ?sign noted by sonographer. ?  ?Common bile duct: ?  ?Diameter: 4 mm ?  ?Liver: ?  ?Diffuse increased liver echotexture without focal parenchymal ?abnormality. No intrahepatic duct dilation. Portal vein is patent on ?color Doppler imaging with normal direction of blood flow  towards ?the liver. ?  ?Other: None. ?  ?IMPRESSION: ?1. Increased liver echotexture consistent with hepatic steatosis. ?2. No evidence of cholelithiasis or cholecystitis. ?  ?  ?Electronically Signed ?  By: Jerilynn Mages

## 2021-04-19 NOTE — Patient Instructions (Signed)
Your provider has requested that you go to the basement level for lab work before leaving today. Press "B" on the elevator. The lab is located at the first door on the left as you exit the elevator. ? ?If you are age 47 or older, your body mass index should be between 23-30. Your Body mass index is 26.64 kg/m?Marland Kitchen If this is out of the aforementioned range listed, please consider follow up with your Primary Care Provider. ? ?If you are age 17 or younger, your body mass index should be between 19-25. Your Body mass index is 26.64 kg/m?Marland Kitchen If this is out of the aformentioned range listed, please consider follow up with your Primary Care Provider.  ? ?________________________________________________________ ? ?The [facility] providers would like to encourage you to use [facility] to communicate with providers for non-urgent requests or questions.  Due to long hold times on the telephone, sending your provider a message by [facility] may be a faster and more efficient way to get a response.  Please allow 48 business hours for a response.  Please remember that this is for non-urgent requests.  ?_______________________________________________________ ? ?Due to recent changes in healthcare laws, you may see the results of your imaging and laboratory studies on MyChart before your provider has had a chance to review them.  We understand that in some cases there may be results that are confusing or concerning to you. Not all laboratory results come back in the same time frame and the provider may be waiting for multiple results in order to interpret others.  Please give [place] 48 hours in order for your provider to thoroughly review all the results before contacting the office for clarification of your results.  ? ?

## 2021-05-27 ENCOUNTER — Telehealth: Payer: Self-pay

## 2021-05-27 DIAGNOSIS — A048 Other specified bacterial intestinal infections: Secondary | ICD-10-CM

## 2021-05-27 NOTE — Telephone Encounter (Signed)
Lab reminder letter sent to patient along with written order for Urea breath test and [facility] location.

## 2021-05-27 NOTE — Telephone Encounter (Signed)
-----   Message from Yevette Edwards, RN sent at 04/19/2021  1:55 PM EDT ----- Regarding: PPI Hold Stop Protonix Urea breath test due on/after 6/5

## 2021-06-04 ENCOUNTER — Telehealth: Payer: Self-pay

## 2021-06-04 NOTE — Telephone Encounter (Signed)
-----   Message from Yevette Edwards, RN sent at 04/19/2021  1:53 PM EDT ----- Regarding: Urea Breath test Urea breath test due on/after 06/10/21 Mail order and instructions to patient

## 2021-06-04 NOTE — Telephone Encounter (Signed)
Attempted to reach patient at her listed phone #. It states that her phone is not working and to reach her at [phone]. I attempted to reach the 2nd phone # twice, the line rings and then goes to a fast busy signal. I attempted to reach pt at her listed # again, lm on vm for patient to return call.   MyChart is active, will send MyChart message to patient to make sure she received letter that was previously sent.

## 2021-06-06 NOTE — Telephone Encounter (Signed)
Lm on vm for patient to return call 

## 2021-06-07 NOTE — Telephone Encounter (Signed)
I was checking to see if patient received reminder letter that was mailed on 05/27/21. No return call received. A letter was mailed on 5/22.

## 2021-06-19 ENCOUNTER — Other Ambulatory Visit: Payer: Self-pay

## 2021-06-19 ENCOUNTER — Ambulatory Visit (INDEPENDENT_AMBULATORY_CARE_PROVIDER_SITE_OTHER): Payer: Commercial Managed Care - HMO | Admitting: Internal Medicine

## 2021-06-19 ENCOUNTER — Encounter: Payer: Self-pay | Admitting: Internal Medicine

## 2021-06-19 VITALS — BP 172/118 | HR 87 | Temp 98.4°F | Ht 67.5 in | Wt 173.3 lb

## 2021-06-19 DIAGNOSIS — A048 Other specified bacterial intestinal infections: Secondary | ICD-10-CM

## 2021-06-19 DIAGNOSIS — F17210 Nicotine dependence, cigarettes, uncomplicated: Secondary | ICD-10-CM | POA: Diagnosis not present

## 2021-06-19 DIAGNOSIS — I10 Essential (primary) hypertension: Secondary | ICD-10-CM | POA: Diagnosis not present

## 2021-06-19 DIAGNOSIS — K219 Gastro-esophageal reflux disease without esophagitis: Secondary | ICD-10-CM

## 2021-06-19 MED ORDER — BISMUTH 262 MG PO CHEW: 2.0000 | CHEWABLE_TABLET | Freq: Three times a day (TID) | ORAL | 0 refills | Status: DC

## 2021-06-19 MED ORDER — METRONIDAZOLE 500 MG PO TABS: 500.0000 mg | ORAL_TABLET | Freq: Three times a day (TID) | ORAL | 0 refills | Status: DC

## 2021-06-19 MED ORDER — DOXYCYCLINE HYCLATE 100 MG PO CAPS: 100.0000 mg | ORAL_CAPSULE | Freq: Two times a day (BID) | ORAL | 0 refills | Status: DC

## 2021-06-19 MED ORDER — PANTOPRAZOLE SODIUM 40 MG PO TBEC: 40.0000 mg | DELAYED_RELEASE_TABLET | Freq: Every day | ORAL | 0 refills | Status: DC

## 2021-06-19 NOTE — Patient Instructions (Addendum)
[name], it was a pleasure seeing you today! You endorsed feeling well today. Below are some of the things we talked about this visit. We look forward to seeing you in the follow up appointment!  Today we discussed: You presented for abdominal pain.  You previously saw the GI doctor and were found to have H. pylori infection.  They prescribed antibiotics for the but you were not able to complete them.  It is very important that you complete those antibiotics and not miss any doses as we would like to get rid of the bacteria.  This bacteria can lead to cancer if left untreated.  I am resending all of these antibiotics to the pharmacy. Your blood pressure was elevated today but you stated that you have not been taking your medications for the last 2 days.  It is very important that you take all your medications especially your blood pressure medications as they can lead to kidney, heart and eye damage that is not reversible. I would like to follow-up in 2 weeks and ensure your blood pressure is doing better and you have completed antibiotics.  I have ordered the following labs today:  Lab Orders  No laboratory test(s) ordered today      Referrals ordered today:   Referral Orders  No referral(s) requested today     I have ordered the following medication/changed the following medications:   Stop the following medications: Medications Discontinued During This Encounter  Medication Reason   pantoprazole (PROTONIX) 40 MG tablet Reorder     Start the following medications: Meds ordered this encounter  Medications   metroNIDAZOLE (FLAGYL) 500 MG tablet    Sig: Take 1 tablet (500 mg total) by mouth 3 (three) times daily for 14 days.    Dispense:  42 tablet    Refill:  0   doxycycline (VIBRAMYCIN) 100 MG capsule    Sig: Take 1 capsule (100 mg total) by mouth 2 (two) times daily for 14 days.    Dispense:  28 capsule    Refill:  0   Bismuth 262 MG CHEW    Sig: Chew 2  tablets by mouth 3 (three) times daily for 14 days.    Dispense:  84 tablet    Refill:  0   pantoprazole (PROTONIX) 40 MG tablet    Sig: Take 1 tablet (40 mg total) by mouth daily.    Dispense:  90 tablet    Refill:  0     Follow-up: 2 week follow up with Dr. Humphrey Rolls  Please make sure to arrive 15 minutes prior to your next appointment. If you arrive late, you may be asked to reschedule.   We look forward to seeing you next time. Please call our clinic at [phone] if you have any questions or concerns. The best time to call is Monday-Friday from 9am-4pm, but there is someone available 24/7. If after hours or the weekend, call the main hospital number and ask for the Internal Medicine Resident On-Call. If you need medication refills, please notify your pharmacy one week in advance and they will send [place] a request.  Thank you for letting us take part in your care. Wishing you the best!  Thank you, Idamae Schuller, MD

## 2021-06-19 NOTE — Progress Notes (Signed)
   CC: abdominal pain  HPI:  Katelyn [name] is a 46 y.o. with medical history as below presenting to [facility] for abdominal pain.   Please see problem-based list for further details, assessments, and plans.  Past Medical History:  Diagnosis Date   Abdominal pain, RLQ 06/23/2014   AKI (acute kidney injury) ([place]) 01/14/2015   Anemia    Anxiety    Chest pain    Depression    Elevated LFTs 04/26/2012   Hypertension    Hypertensive urgency 06/13/2018   Migraine    Palpitations      Current Outpatient Medications (Cardiovascular):    atorvastatin (LIPITOR) 80 MG tablet, Take 1 tablet (80 mg total) by mouth daily.   diltiazem (DILACOR XR) 240 MG 24 hr capsule, Take 240 mg by mouth daily.   lisinopril (ZESTRIL) 10 MG tablet, Take 1 tablet (10 mg total) by mouth daily.   triamterene-hydrochlorothiazide (MAXZIDE-25) 37.5-25 MG tablet, Take 1 tablet by mouth daily.     Current Outpatient Medications (Other):    Calcium Carbonate Antacid (ALKA-SELTZER ANTACID PO), Take 1 tablet by mouth daily as needed (stomach acid).   hydrOXYzine (ATARAX) 25 MG tablet, Take 1 tablet (25 mg total) by mouth every 6 (six) hours as needed for anxiety.   pantoprazole (PROTONIX) 40 MG tablet, Take 1 tablet (40 mg total) by mouth daily.  Review of Systems:  Review of system negative unless stated in the problem list or HPI.    Physical Exam:  Vitals:   06/19/21 1517 06/19/21 1519  BP:  (!) 178/121  Pulse:  100  Temp:  98.4 F (36.9 C)  TempSrc:  Oral  SpO2:  100%  Weight: 173 lb 4.8 oz (78.6 kg)   Height: 5' 7.5" (1.715 m)     Physical Exam General: NAD HENT: NCAT Lungs: CTAB, no wheeze, rhonchi or rales.  Cardiovascular: Normal heart sounds, no r/m/g, 2+ pulses in all extremities. No LE edema Abdomen: TTP in epigastric area, normal bowel sounds MSK: No asymmetry or muscle atrophy.  Skin: no lesions noted on exposed skin Neuro: Alert and oriented x4. CN grossly intact Psych: Normal mood  and normal affect   Assessment & Plan:   See Encounters Tab for problem based charting.  Patient discussed with Dr. Roderic Ovens, MD

## 2021-06-20 ENCOUNTER — Telehealth: Payer: Self-pay | Admitting: *Deleted

## 2021-06-20 NOTE — Telephone Encounter (Signed)
Thank you :)

## 2021-06-20 NOTE — Telephone Encounter (Signed)
Patient called in with list of meds she has been taking for ~ 1 year:  Atorvastatin 80 mg Triamterene-HCTZ 37.5-25 mg Metoprolol 50 mg Lisinopril 10 mg Pantoprazole 40 mg.

## 2021-06-21 DIAGNOSIS — A048 Other specified bacterial intestinal infections: Secondary | ICD-10-CM | POA: Insufficient documentation

## 2021-06-21 MED ORDER — METOPROLOL TARTRATE 50 MG PO TABS: 50.0000 mg | ORAL_TABLET | Freq: Two times a day (BID) | ORAL | 3 refills | Status: DC

## 2021-06-21 NOTE — Progress Notes (Signed)
Internal Medicine Clinic Attending  Case discussed with the resident at the time of the visit.  We reviewed the resident's history and exam and pertinent patient test results.  I agree with the assessment, diagnosis, and plan of care documented in the resident's note.  

## 2021-06-21 NOTE — Assessment & Plan Note (Signed)
Patient has hx of HTN that is uncontrolled. BP in the clinic was 178/121. She is asymptomatic. Her elevated bp appears 2/2 to medication non-adherence as secondary causes have been ruled out and she is endorsing non-adherence. Her home regimen is triamterene-HCTZ 37.5-25 mg qd, Lisinopril 10 mg qd, and metoprolol. Diltiazem is listed as one of her medications but patient verified she is not taking that but she is taking metoprolol instead. She states she has been out of 2 of her 3 medications but is unsure which ones. They have refills but patient has not been able to pick it up. Advised to restart her htn medications and importance of strict adherence to her regimen.  -Continue triamterene-HCTZ, lisinopril, and metoprolol.  -Follow up in 2 weeks for HTN

## 2021-06-21 NOTE — Assessment & Plan Note (Addendum)
Patient was diagnosed with H. pylori infection and was started on triple therapy including metronidazole 500 mg 3 times daily, doxycycline 100 mg twice daily, and bismuth subsalicylate 0-479 mg tablets 3 times a day for 14 days.  She reported symptomatic improvement but she was unable to complete her therapy due to diarrhea but states he will be able to completed after counseling.  I will resend her antibiotics to the pharmacy and we will start PPI with triple therapy.  We will follow-up in 2 weeks to ensure she has completed her therapy and will check on her blood pressure at that time.  We will stop her PPI at that time and schedule confirmatory tests 2 weeks later. - Restart quadruple therapy. - Follow-up in 2 weeks

## 2021-07-02 ENCOUNTER — Emergency Department (HOSPITAL_COMMUNITY)
Admission: EM | Admit: 2021-07-02 | Discharge: 2021-07-02 | Disposition: A | Discharge status: Home / Self Care | Payer: Commercial Managed Care - HMO | Attending: Emergency Medicine | Admitting: Emergency Medicine

## 2021-07-02 ENCOUNTER — Encounter (HOSPITAL_COMMUNITY): Payer: Self-pay | Admitting: Emergency Medicine

## 2021-07-02 ENCOUNTER — Emergency Department (HOSPITAL_COMMUNITY): Payer: Commercial Managed Care - HMO

## 2021-07-02 ENCOUNTER — Other Ambulatory Visit: Payer: Self-pay

## 2021-07-02 DIAGNOSIS — R220 Localized swelling, mass and lump, head: Secondary | ICD-10-CM | POA: Diagnosis not present

## 2021-07-02 DIAGNOSIS — R000 Tachycardia, unspecified: Secondary | ICD-10-CM | POA: Insufficient documentation

## 2021-07-02 DIAGNOSIS — Z79899 Other long term (current) drug therapy: Secondary | ICD-10-CM | POA: Insufficient documentation

## 2021-07-02 DIAGNOSIS — T7840XA Allergy, unspecified, initial encounter: Secondary | ICD-10-CM

## 2021-07-02 DIAGNOSIS — R1011 Right upper quadrant pain: Secondary | ICD-10-CM | POA: Insufficient documentation

## 2021-07-02 DIAGNOSIS — R1013 Epigastric pain: Secondary | ICD-10-CM | POA: Diagnosis present

## 2021-07-02 DIAGNOSIS — I10 Essential (primary) hypertension: Secondary | ICD-10-CM | POA: Insufficient documentation

## 2021-07-02 DIAGNOSIS — R1012 Left upper quadrant pain: Secondary | ICD-10-CM | POA: Diagnosis not present

## 2021-07-02 DIAGNOSIS — K2900 Acute gastritis without bleeding: Secondary | ICD-10-CM

## 2021-07-02 LAB — HEPATIC FUNCTION PANEL
ALT: 93 U/L — ABNORMAL HIGH (ref 0–44)
AST: 187 U/L — ABNORMAL HIGH (ref 15–41)
Albumin: 4.1 g/dL (ref 3.5–5.0)
Alkaline Phosphatase: 74 U/L (ref 38–126)
Bilirubin, Direct: 0.2 mg/dL (ref 0.0–0.2)
Indirect Bilirubin: 0.5 mg/dL (ref 0.3–0.9)
Total Bilirubin: 0.7 mg/dL (ref 0.3–1.2)
Total Protein: 7.1 g/dL (ref 6.5–8.1)

## 2021-07-02 LAB — CBC
HCT: 45 % (ref 36.0–46.0)
Hemoglobin: 16.7 g/dL — ABNORMAL HIGH (ref 12.0–15.0)
MCH: 34 pg (ref 26.0–34.0)
MCHC: 37.1 g/dL — ABNORMAL HIGH (ref 30.0–36.0)
MCV: 91.6 fL (ref 80.0–100.0)
Platelets: 217 10*3/uL (ref 150–400)
RBC: 4.91 MIL/uL (ref 3.87–5.11)
RDW: 11.9 % (ref 11.5–15.5)
WBC: 8.7 10*3/uL (ref 4.0–10.5)
nRBC: 0 % (ref 0.0–0.2)

## 2021-07-02 LAB — BASIC METABOLIC PANEL
Anion gap: 15 (ref 5–15)
BUN: 10 mg/dL (ref 6–20)
CO2: 26 mmol/L (ref 22–32)
Calcium: 9.4 mg/dL (ref 8.9–10.3)
Chloride: 98 mmol/L (ref 98–111)
Creatinine, Ser: 0.95 mg/dL (ref 0.44–1.00)
GFR, Estimated: >60 mL/min (ref >60)
Glucose, Bld: 139 mg/dL — ABNORMAL HIGH (ref 70–99)
Potassium: 3.1 mmol/L — ABNORMAL LOW (ref 3.5–5.1)
Sodium: 139 mmol/L (ref 135–145)

## 2021-07-02 LAB — LIPASE, BLOOD: Lipase: 46 U/L (ref 11–51)

## 2021-07-02 LAB — TROPONIN I (HIGH SENSITIVITY)
Troponin I (High Sensitivity): 16 ng/L (ref <18)
Troponin I (High Sensitivity): 16 ng/L (ref <18)

## 2021-07-02 LAB — I-STAT BETA HCG BLOOD, ED (MC, WL, AP ONLY): I-stat hCG, quantitative: <5 m[IU]/mL (ref <5)

## 2021-07-02 MED ORDER — ONDANSETRON HCL 4 MG/2ML IJ SOLN
4.0000 mg | Freq: Once | INTRAMUSCULAR | Status: AC
  Administered 2021-07-02: 4 mg via INTRAVENOUS
  Filled 2021-07-02: qty 2

## 2021-07-02 MED ORDER — LABETALOL HCL 5 MG/ML IV SOLN
10.0000 mg | Freq: Once | INTRAVENOUS | Status: AC
  Administered 2021-07-02: 10 mg via INTRAVENOUS
  Filled 2021-07-02: qty 4

## 2021-07-02 MED ORDER — FAMOTIDINE 20 MG PO TABS
40.0000 mg | ORAL_TABLET | Freq: Once | ORAL | Status: AC
  Administered 2021-07-02: 40 mg via ORAL
  Filled 2021-07-02: qty 2

## 2021-07-02 MED ORDER — METHYLPREDNISOLONE SODIUM SUCC 125 MG IJ SOLR
125.0000 mg | INTRAMUSCULAR | Status: AC
  Administered 2021-07-02: 125 mg via INTRAVENOUS
  Filled 2021-07-02: qty 2

## 2021-07-02 MED ORDER — MORPHINE SULFATE (PF) 4 MG/ML IV SOLN
6.0000 mg | Freq: Once | INTRAVENOUS | Status: AC
  Administered 2021-07-02: 6 mg via INTRAVENOUS
  Filled 2021-07-02: qty 2

## 2021-07-02 MED ORDER — LACTATED RINGERS IV BOLUS
1000.0000 mL | Freq: Once | INTRAVENOUS | Status: AC
  Administered 2021-07-02: 1000 mL via INTRAVENOUS

## 2021-07-02 MED ORDER — LACTATED RINGERS IV SOLN: INTRAVENOUS | Status: DC

## 2021-07-02 MED ORDER — EPINEPHRINE 0.3 MG/0.3ML IJ SOAJ: 0.3000 mg | INTRAMUSCULAR | 0 refills | Status: AC | PRN

## 2021-07-02 MED ORDER — ONDANSETRON HCL 4 MG/2ML IJ SOLN: 4.0000 mg | Freq: Once | INTRAMUSCULAR | Status: DC

## 2021-07-02 MED ORDER — POTASSIUM CHLORIDE 10 MEQ/100ML IV SOLN
10.0000 meq | Freq: Once | INTRAVENOUS | Status: AC
  Administered 2021-07-02: 10 meq via INTRAVENOUS
  Filled 2021-07-02: qty 100

## 2021-07-02 MED ORDER — DIPHENHYDRAMINE HCL 50 MG/ML IJ SOLN
25.0000 mg | Freq: Once | INTRAMUSCULAR | Status: AC
  Administered 2021-07-02: 25 mg via INTRAVENOUS
  Filled 2021-07-02: qty 1

## 2021-07-02 MED ORDER — IOHEXOL 300 MG/ML  SOLN
100.0000 mL | Freq: Once | INTRAMUSCULAR | Status: AC | PRN
  Administered 2021-07-02: 100 mL via INTRAVENOUS

## 2021-07-02 NOTE — ED Triage Notes (Signed)
Patient reports central chest pressure onset of this morning when waking. States EMS came out and did EKG but started to feel better. Pain worsened about 30 mins ago.

## 2021-07-03 ENCOUNTER — Other Ambulatory Visit: Payer: Self-pay

## 2021-07-03 ENCOUNTER — Encounter: Payer: Self-pay | Admitting: Student

## 2021-07-03 ENCOUNTER — Ambulatory Visit (INDEPENDENT_AMBULATORY_CARE_PROVIDER_SITE_OTHER): Payer: Commercial Managed Care - HMO | Admitting: Student

## 2021-07-03 VITALS — BP 186/126 | HR 84 | Temp 98.6°F | Ht 68.0 in | Wt 174.4 lb

## 2021-07-03 DIAGNOSIS — B9681 Helicobacter pylori [H. pylori] as the cause of diseases classified elsewhere: Secondary | ICD-10-CM

## 2021-07-03 DIAGNOSIS — A048 Other specified bacterial intestinal infections: Secondary | ICD-10-CM

## 2021-07-03 DIAGNOSIS — E876 Hypokalemia: Secondary | ICD-10-CM

## 2021-07-03 DIAGNOSIS — E782 Mixed hyperlipidemia: Secondary | ICD-10-CM

## 2021-07-03 DIAGNOSIS — F17210 Nicotine dependence, cigarettes, uncomplicated: Secondary | ICD-10-CM

## 2021-07-03 DIAGNOSIS — I10 Essential (primary) hypertension: Secondary | ICD-10-CM

## 2021-07-03 DIAGNOSIS — E785 Hyperlipidemia, unspecified: Secondary | ICD-10-CM

## 2021-07-03 MED ORDER — METOPROLOL TARTRATE 50 MG PO TABS: 50.0000 mg | ORAL_TABLET | Freq: Two times a day (BID) | ORAL | 5 refills | Status: DC

## 2021-07-03 MED ORDER — TRIAMTERENE-HCTZ 37.5-25 MG PO TABS: 1.0000 | ORAL_TABLET | Freq: Every day | ORAL | 2 refills | Status: DC

## 2021-07-03 MED ORDER — LISINOPRIL 10 MG PO TABS: 10.0000 mg | ORAL_TABLET | Freq: Every day | ORAL | 2 refills | Status: DC

## 2021-07-03 NOTE — Patient Instructions (Signed)
Thank you, [name] for allowing [place] to provide your care today. Today we discussed your recent ER visit, blood pressure and you are drinking.   For your blood pressure, I am refilling all your blood pressure medications and sending to your new pharmacy.  Make sure you are taking all these medications as prescribed.  For your alcohol use, I want you to start cutting down slowly and let us know when you are ready to quit completely so we can help you through that process.  I have ordered the following labs for you:  Lab Orders         BMP8+Anion Gap         Magnesium      I will call if any are abnormal. All of your labs can be accessed through "My Chart".  I have ordered the following medication/changed the following medications:  I have refilled all your blood pressure medications  My Chart Access: [email or web address]  Please follow-up in 2 weeks for BP check and H. pylori retest  Please make sure to arrive 15 minutes prior to your next appointment. If you arrive late, you may be asked to reschedule.    We look forward to seeing you next time. Please call our clinic at [phone] if you have any questions or concerns. The best time to call is Monday-Friday from 9am-4pm, but there is someone available 24/7. If after hours or the weekend, call the main hospital number and ask for the Internal Medicine Resident On-Call. If you need medication refills, please notify your pharmacy one week in advance and they will send [place] a request.   Thank you for letting us take part in your care. Wishing you the best!  Lacinda Axon, MD 07/03/2021, 3:43 PM IM Resident, PGY-2 [facility] 41:10

## 2021-07-03 NOTE — Progress Notes (Signed)
   CC: Follow-up  HPI:  [name] is a 46 y.o. female with PMH as below who presents to clinic to follow-up on her recent H. pylori infection and her uncontrolled blood pressure. Please see problem based charting for evaluation, assessment and plan.  Past Medical History:  Diagnosis Date   Abdominal pain, RLQ 06/23/2014   AKI (acute kidney injury) ([place]) 01/14/2015   Anemia    Anxiety    Chest pain    Depression    Elevated LFTs 04/26/2012   Hypertension    Hypertensive urgency 06/13/2018   Migraine    Palpitations     Review of Systems:  Constitutional: Negative for fever or fatigue Eyes: Negative for visual changes Respiratory: Negative for shortness of breath Abdomen: Negative for abdominal pain, constipation or diarrhea Neuro: Negative for headache or weakness  Physical Exam: General: Pleasant, well-appearing middle-aged woman.  No acute distress. Cardiac: Mild tachycardic. Normal rhythm. No murmurs, rubs or gallops. No LE edema Respiratory: Lungs CTAB. No wheezing or crackles. Abdominal: Soft, symmetric and non tender. Normal BS. Skin: Warm, dry and intact without rashes or lesions Extremities: Atraumatic. Full ROM. Palpable radial and DP pulses. Neuro: A&O x 3. Moves all extremities. Normal sensation to gross touch. Psych: Appropriate mood and affect.  Vitals:   07/03/21 1455 07/03/21 1459 07/03/21 1540  BP: (!) 177/131 (!) 172/130 (!) 186/126  Pulse: (!) 109 (!) 109 84  Temp:  98.6 F (37 C)   TempSrc:  Oral   SpO2:  100%   Weight:  174 lb 6.4 oz (79.1 kg)   Height:  '5\' 8"'$  (1.727 m)     Assessment & Plan:   See Encounters Tab for problem based charting.  Patient discussed with Dr. Lorenz Coaster, MD, MPH

## 2021-07-04 LAB — BMP8+ANION GAP
Anion Gap: 18 mmol/L (ref 10.0–18.0)
BUN/Creatinine Ratio: 16 (ref 9–23)
BUN: 16 mg/dL (ref 6–24)
CO2: 23 mmol/L (ref 20–29)
Calcium: 10 mg/dL (ref 8.7–10.2)
Chloride: 98 mmol/L (ref 96–106)
Creatinine, Ser: 0.97 mg/dL (ref 0.57–1.00)
Glucose: 105 mg/dL — ABNORMAL HIGH (ref 70–99)
Potassium: 4.3 mmol/L (ref 3.5–5.2)
Sodium: 139 mmol/L (ref 134–144)
eGFR: 73 mL/min/{1.73_m2} (ref >59)

## 2021-07-04 LAB — MAGNESIUM: Magnesium: 1.8 mg/dL (ref 1.6–2.3)

## 2021-07-05 ENCOUNTER — Encounter: Payer: Self-pay | Admitting: Student

## 2021-07-05 MED ORDER — ATORVASTATIN CALCIUM 80 MG PO TABS: 80.0000 mg | ORAL_TABLET | Freq: Every day | ORAL | 1 refills | Status: DC

## 2021-07-05 NOTE — Assessment & Plan Note (Signed)
Patient presents for 2-week follow-up on her resistant hypertension.  Patient reports she tried to pick up her blood pressure medications at [facility] but was informed that the pharmacy is not a preferred pharmacy for Katelyn [name] this year.  She will be presenting to the emergency room with epigastric pain and facial swelling and was diagnosed with allergic reaction to shellfish.  BP was significantly elevated and this was treated with IV labetalol.  She had mild hypokalemia to 3.1 and received potassium supplementation.  She presents today for follow-up and to change her pharmacy.  Blood pressure remains significantly elevated with SBP in the 170s to 180s.  Patient now is prefers her prescription sent to [facility] between [place] and [address].  Her facial swelling has resolved and she has no acute concerns.  BMP showed improved potassium to 4.3 with normal kidney function.  Plan: -Refilled lisinopril 10 mg daily -Refilled metoprolol 50 mg twice daily -Refilled triamterene-HCTZ 37.5 mg- 25 mg daily -Follow-up in 2 weeks for reevaluation

## 2021-07-05 NOTE — Assessment & Plan Note (Signed)
Patient currently on H. pylori infection treatment. She will complete treatment today. Patient advised to hold her PPI for the next 2 weeks until repeat testing. Patient will need H. pylori breath test at next office visit.  Future order has been placed for this. -Follow-up already scheduled for July 12

## 2021-07-05 NOTE — Assessment & Plan Note (Signed)
Refilled atorvastatin 80 mg daily and sent to her new pharmacy.

## 2021-07-11 NOTE — Progress Notes (Signed)
Internal Medicine Clinic Attending  Case discussed with the resident at the time of the visit.  We reviewed the resident's history and exam and pertinent patient test results.  I agree with the assessment, diagnosis, and plan of care documented in the resident's note.  

## 2021-07-17 ENCOUNTER — Encounter: Payer: Self-pay | Admitting: Student

## 2021-07-17 ENCOUNTER — Ambulatory Visit (INDEPENDENT_AMBULATORY_CARE_PROVIDER_SITE_OTHER): Payer: Commercial Managed Care - HMO | Admitting: Student

## 2021-07-17 VITALS — BP 125/91 | HR 99 | Temp 98.8°F | Wt 168.9 lb

## 2021-07-17 DIAGNOSIS — E782 Mixed hyperlipidemia: Secondary | ICD-10-CM

## 2021-07-17 DIAGNOSIS — M109 Gout, unspecified: Secondary | ICD-10-CM | POA: Diagnosis not present

## 2021-07-17 DIAGNOSIS — A048 Other specified bacterial intestinal infections: Secondary | ICD-10-CM

## 2021-07-17 MED ORDER — COLCHICINE 0.6 MG PO TABS: 0.6000 mg | ORAL_TABLET | Freq: Two times a day (BID) | ORAL | 0 refills | Status: DC

## 2021-07-17 NOTE — Progress Notes (Signed)
CC: Left foot pain   HPI:  [name] is a 46 y.o. female living with a history stated below and presents today for left foot pain and H.Pylori follow up. Please see problem based assessment and plan for additional details.  Past Medical History:  Diagnosis Date   Abdominal pain, RLQ 06/23/2014   AKI (acute kidney injury) ([place]) 01/14/2015   Anemia    Anxiety    Chest pain    Depression    Elevated LFTs 04/26/2012   Hypertension    Hypertensive urgency 06/13/2018   Migraine    Palpitations     Current Outpatient Medications on File Prior to Visit  Medication Sig Dispense Refill   acetaminophen (TYLENOL) 500 MG tablet Take 1,000 mg by mouth daily as needed for headache (pain).     aspirin-sod bicarb-citric acid (ALKA-SELTZER) 325 MG TBEF tablet Take 650 mg by mouth daily as needed (heartburn).     atorvastatin (LIPITOR) 80 MG tablet Take 1 tablet (80 mg total) by mouth daily. 90 tablet 1   Calcium Carbonate Antacid (ALKA-SELTZER ANTACID PO) Take 1 tablet by mouth daily as needed (stomach acid).     EPINEPHrine 0.3 mg/0.3 mL IJ SOAJ injection Inject 0.3 mg into the muscle as needed for anaphylaxis. 1 each 0   hydrOXYzine (ATARAX) 25 MG tablet Take 1 tablet (25 mg total) by mouth every 6 (six) hours as needed for anxiety. (Patient not taking: Reported on 07/02/2021) 30 tablet 2   lisinopril (ZESTRIL) 10 MG tablet Take 1 tablet (10 mg total) by mouth daily. 60 tablet 2   metoprolol tartrate (LOPRESSOR) 50 MG tablet Take 1 tablet (50 mg total) by mouth 2 (two) times daily. 120 tablet 5   triamterene-hydrochlorothiazide (MAXZIDE-25) 37.5-25 MG tablet Take 1 tablet by mouth daily. 60 tablet 2   [DISCONTINUED] citalopram (CELEXA) 10 MG tablet Take 1 tablet (10 mg total) by mouth daily. 30 tablet 2   [DISCONTINUED] potassium chloride SA (K-DUR,KLOR-CON) 20 MEQ tablet Take 1 tablet (20 mEq total) by mouth daily. For 7 days 7 tablet 0   No current facility-administered medications on  file prior to visit.    Family History  Problem Relation Age of Onset   Diabetes Mother    Depression Mother    Hypertension Mother    Hypertension Father    Hypertension Brother    Hypertension Brother    Breast cancer Neg Hx    Colon cancer Neg Hx    Stomach cancer Neg Hx    Esophageal cancer Neg Hx     Social History   Socioeconomic History   Marital status: Married    Spouse name: Not on file   Number of children: 3   Years of education: Not on file   Highest education level: 11th grade  Occupational History   Occupation: Health visitor  Tobacco Use   Smoking status: Every Day    Years: 13.00    Types: Cigarettes    Start date: 01/06/1994    Last attempt to quit: 06/12/2018    Years since quitting: 3.0   Smokeless tobacco: Never   Tobacco comments:    1 pack per week  Vaping Use   Vaping Use: Never used  Substance and Sexual Activity   Alcohol use: Yes    Alcohol/week: 0.0 standard drinks of alcohol    Comment: occassionally   Drug use: No   Sexual activity: Yes    Birth control/protection: Surgical  Other Topics Concern  Not on file  Social History Narrative   Not on file   Social Determinants of Health   Financial Resource Strain: Not on file  Food Insecurity: No Food Insecurity (08/07/2020)   Hunger Vital Sign    Worried About Running Out of Food in the Last Year: Never true    [place] in the Last Year: Never true  Transportation Needs: No Transportation Needs (08/07/2020)   PRAPARE - Hydrologist (Medical): No    Lack of Transportation (Non-Medical): No  Physical Activity: Not on file  Stress: Not on file  Social Connections: Not on file  Intimate Partner Violence: Not on file    Review of Systems: ROS negative except for what is noted on the assessment and plan.  Vitals:   07/17/21 1423  BP: (!) 125/91  Pulse: 99  Temp: 98.8 F (37.1 C)  TempSrc: Oral  SpO2: 100%  Weight: 168 lb 14.4 oz (76.6 kg)     Physical Exam: Constitutional: well-appearing woman sitting comfortably, in no acute distress Cardiovascular: regular rate and rhythm, no m/r/g Pulmonary/Chest: normal work of breathing on room air, lungs clear to auscultation bilaterally Abdominal: soft, non-tender, non-distended MSK: normal bulk and tone, large bump on L metatarsal big toe  Skin: warm and dry  Assessment & Plan:   Gout For the past 3-week weeks patient has had left foot pain. A large, dark, bump is on the left metatarsal of the big toe, there is a big dark bump.  Pain radiates throughout the entire foot but does not go higher than her ankle, and is also unilateral. At the time of examination the pain was about a 6 out of 10 however can get up to a 10 out of 10.  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  The pain is constant however the pain level varies.  She says that Tylenol has helped her a little bit and as well as foot baths.  She describes the pain as aching.    She has full range of motion of the foot, and is currently not tender to palpation.  She also states that this is the first time she has ever had to deal with the problem like this, and her family history is positive for gout.  Plan:  1. Prescribed colchicine to take 2-3 times a day for a week, which will hopefully reduce the inflammation around the area.  2.  Also recommended to take ibuprofen 400 mg as needed  3. At her follow-up visit depending on the inflammation we will get some uric acid labs  H. pylori infection Patient has finished her quadruple therapy, and has been off of proton pump inhibitors for 2 weeks.  Today we were supposed to do a urea breath test to determine if treatment was effective, however due to lab restraints we were not able to.  Plan:  1.  Scheduled follow-up in 1 week to complete urea breath test  Hyperlipidemia Patient's last lipid profile was done on February 27, 2021.  At that time cholesterol was  elevated at 249, triglycerides were at 574, and LDL was at 100.  She is currently on atorvastatin 80 mg.   Plan:  1.  Lipid panel was done today, we will go over the results with patient once they are available.  2.  Continue current medical regimen.  Patient seen with Dr. Charissa Bash Katelyn Radloff, M.D. [facility],  PGY-1 Phone: [phone] Date 07/17/2021 Time 8:51 PM

## 2021-07-17 NOTE — Patient Instructions (Signed)
Thank you so much for coming in the clinic today, was a pleasure seeing you.  We talked about a few things today hears a quick summary:  1.  I will see you back next week for the breath test to look for H. Pylori.  2.  It also looks like you may have a condition called gout on his toe/ankle.  We are giving you some medicine called colchicine I already sent it to your pharmacy, and you can also take Tylenol 400 mg as needed as well, and hopefully the pain will go away.  3.  We also got a lipid panel done today just to check your cholesterol out and we will go over the results next week or if they come before then I will give you a phone call.  Please let us know if you have any questions and you can contact to contact us at [phone]  Dr. Sanjuana Mae

## 2021-07-17 NOTE — Assessment & Plan Note (Signed)
Patient has finished her quadruple therapy, and has been off of proton pump inhibitors for 2 weeks.  Today we were supposed to do a urea breath test to determine if treatment was effective, however due to lab restraints we were not able to.  Plan:  1.  Scheduled follow-up in 1 week to complete urea breath test

## 2021-07-17 NOTE — Assessment & Plan Note (Signed)
Patient's last lipid profile was done on February 27, 2021.  At that time cholesterol was elevated at 249, triglycerides were at 574, and LDL was at 100.  She is currently on atorvastatin 80 mg.   Plan:  1.  Lipid panel was done today, we will go over the results with patient once they are available.  2.  Continue current medical regimen.

## 2021-07-17 NOTE — Assessment & Plan Note (Addendum)
For the past 3-week weeks patient has had left foot pain. A large, dark, bump is on the left metatarsal of the big toe, there is a big dark bump.  Pain radiates throughout the entire foot but does not go higher than her ankle, and is also unilateral. At the time of examination the pain was about a 6 out of 10 however can get up to a 10 out of 10.  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  The pain is constant however the pain level varies.  She says that Tylenol has helped her a little bit and as well as foot baths.  She describes the pain as aching.    She has full range of motion of the foot, and is currently not tender to palpation.  She also states that this is the first time she has ever had to deal with the problem like this, and her family history is positive for gout.  Plan:  1. Prescribed colchicine to take 2-3 times a day for a week, which will hopefully reduce the inflammation around the area.  2.  Also recommended to take ibuprofen 400 mg as needed  3. At her follow-up visit depending on the inflammation we will get some uric acid labs

## 2021-07-18 LAB — LIPID PANEL
Chol/HDL Ratio: 3.5 ratio (ref 0.0–4.4)
Cholesterol, Total: 156 mg/dL (ref 100–199)
HDL: 45 mg/dL (ref >39)
LDL Chol Calc (NIH): 64 mg/dL (ref 0–99)
Triglycerides: 297 mg/dL — ABNORMAL HIGH (ref 0–149)
VLDL Cholesterol Cal: 47 mg/dL — ABNORMAL HIGH (ref 5–40)

## 2021-07-18 NOTE — Progress Notes (Signed)
Internal Medicine Clinic Attending  I saw and evaluated the patient.  I personally confirmed the key portions of the history and exam documented by Dr. Sanjuana Mae and I reviewed pertinent patient test results.  The assessment, diagnosis, and plan were formulated together and I agree with the documentation in the resident's note.   Exam shows inflammation and tenderness at the MTP consistent with podagra and gout. She has a strong family history of gout, this is her first flare. No effusion to sample. We discussed treatment of the flare and potential suppression therapy in the future. Will start colchicine and NSAIDs today for the flare. Then will check uric acid in a few weeks and consider preventive treatment.

## 2021-07-18 NOTE — Addendum Note (Signed)
Addended by: Lalla Brothers T on: 07/18/2021 09:50 AM   Modules accepted: Orders

## 2021-07-24 ENCOUNTER — Encounter: Payer: Self-pay | Admitting: Student

## 2021-07-24 ENCOUNTER — Ambulatory Visit (INDEPENDENT_AMBULATORY_CARE_PROVIDER_SITE_OTHER): Payer: Commercial Managed Care - HMO | Admitting: Student

## 2021-07-24 VITALS — BP 139/103 | HR 90 | Wt 170.0 lb

## 2021-07-24 DIAGNOSIS — M109 Gout, unspecified: Secondary | ICD-10-CM | POA: Diagnosis not present

## 2021-07-24 DIAGNOSIS — A048 Other specified bacterial intestinal infections: Secondary | ICD-10-CM

## 2021-07-24 NOTE — Patient Instructions (Signed)
Thank you so much for coming into the clinic today! Here is a summary of everything we talked about!  We're doing the breath test today, I will call you with the results! We are also doing a uric acid test today for your gout, just to establish a baseline.   If you have any questions please call the clinic at [phone].  Best, Dr. Sanjuana Mae

## 2021-07-24 NOTE — Assessment & Plan Note (Signed)
Pt was started on Colchicine .'6mg'$  to take as needed, and has stated that the pain has significantly reduced since she started taking the medication. On physical exam her R toe is warm, but inflammation has significantly decreased since her last visit on 7/12.   Plan:   - Uric acid level was done today to establish baseline in case gout attacks recur.  - Advised pt to take colchicine if she has another attack

## 2021-07-24 NOTE — Progress Notes (Signed)
CC: H.Pylori check   HPI:  [name] is a 46 y.o. female living with a history stated below and presents today for a checkup for her H.Pylori. Please see problem based assessment and plan for additional details.  Past Medical History:  Diagnosis Date   Abdominal pain, RLQ 06/23/2014   AKI (acute kidney injury) ([place]) 01/14/2015   Anemia    Anxiety    Chest pain    Depression    Elevated LFTs 04/26/2012   Hypertension    Hypertensive urgency 06/13/2018   Migraine    Palpitations     Current Outpatient Medications on File Prior to Visit  Medication Sig Dispense Refill   acetaminophen (TYLENOL) 500 MG tablet Take 1,000 mg by mouth daily as needed for headache (pain).     aspirin-sod bicarb-citric acid (ALKA-SELTZER) 325 MG TBEF tablet Take 650 mg by mouth daily as needed (heartburn).     atorvastatin (LIPITOR) 80 MG tablet Take 1 tablet (80 mg total) by mouth daily. 90 tablet 1   Calcium Carbonate Antacid (ALKA-SELTZER ANTACID PO) Take 1 tablet by mouth daily as needed (stomach acid).     colchicine 0.6 MG tablet Take 1 tablet (0.6 mg total) by mouth 2 (two) times daily for 7 days. 14 tablet 0   EPINEPHrine 0.3 mg/0.3 mL IJ SOAJ injection Inject 0.3 mg into the muscle as needed for anaphylaxis. 1 each 0   hydrOXYzine (ATARAX) 25 MG tablet Take 1 tablet (25 mg total) by mouth every 6 (six) hours as needed for anxiety. (Patient not taking: Reported on 07/02/2021) 30 tablet 2   lisinopril (ZESTRIL) 10 MG tablet Take 1 tablet (10 mg total) by mouth daily. 60 tablet 2   metoprolol tartrate (LOPRESSOR) 50 MG tablet Take 1 tablet (50 mg total) by mouth 2 (two) times daily. 120 tablet 5   triamterene-hydrochlorothiazide (MAXZIDE-25) 37.5-25 MG tablet Take 1 tablet by mouth daily. 60 tablet 2   [DISCONTINUED] citalopram (CELEXA) 10 MG tablet Take 1 tablet (10 mg total) by mouth daily. 30 tablet 2   [DISCONTINUED] potassium chloride SA (K-DUR,KLOR-CON) 20 MEQ tablet Take 1 tablet (20  mEq total) by mouth daily. For 7 days 7 tablet 0   No current facility-administered medications on file prior to visit.    Family History  Problem Relation Age of Onset   Diabetes Mother    Depression Mother    Hypertension Mother    Hypertension Father    Hypertension Brother    Hypertension Brother    Breast cancer Neg Hx    Colon cancer Neg Hx    Stomach cancer Neg Hx    Esophageal cancer Neg Hx     Social History   Socioeconomic History   Marital status: Married    Spouse name: Not on file   Number of children: 3   Years of education: Not on file   Highest education level: 11th grade  Occupational History   Occupation: Health visitor  Tobacco Use   Smoking status: Every Day    Years: 13.00    Types: Cigarettes    Start date: 01/06/1994    Last attempt to quit: 06/12/2018    Years since quitting: 3.1   Smokeless tobacco: Never   Tobacco comments:    1 pack per week  Vaping Use   Vaping Use: Never used  Substance and Sexual Activity   Alcohol use: Yes    Alcohol/week: 0.0 standard drinks of alcohol    Comment: occassionally  Drug use: No   Sexual activity: Yes    Birth control/protection: Surgical  Other Topics Concern   Not on file  Social History Narrative   Not on file   Social Determinants of Health   Financial Resource Strain: Not on file  Food Insecurity: No Food Insecurity (08/07/2020)   Hunger Vital Sign    Worried About Running Out of Food in the Last Year: Never true    [place] in the Last Year: Never true  Transportation Needs: No Transportation Needs (08/07/2020)   PRAPARE - Hydrologist (Medical): No    Lack of Transportation (Non-Medical): No  Physical Activity: Not on file  Stress: Not on file  Social Connections: Not on file  Intimate Partner Violence: Not on file    Review of Systems: ROS negative except for what is noted on the assessment and plan.  Vitals:   07/24/21 1515  BP: (!) 139/103   Pulse: 90  SpO2: 100%  Weight: 170 lb (77.1 kg)    Physical Exam: Constitutional: well-appearing woman sitting comfortably, in no acute distress HENT: normocephalic atraumatic, mucous membranes moist Eyes: conjunctiva non-erythematous Neck: supple Cardiovascular: regular rate and rhythm, no m/r/g Pulmonary/Chest: normal work of breathing on room air, lungs clear to auscultation bilaterally MSK: normal bulk and tone, warm R toe Skin: warm and dry   Assessment & Plan:   H. pylori infection Pt is here for a follow up regarding her H.Pylori infection. She was supposed to receive her Urea Breath test for confirmation of the infection going away last week, however due to lack of supplies we were unable to do it. She denies any abdominal pain, or diarrhea.   Plan:   - Urea breath test was done today, will call patient upon results returning   Gout Pt was started on Colchicine .'6mg'$  to take as needed, and has stated that the pain has significantly reduced since she started taking the medication. On physical exam her R toe is warm, but inflammation has significantly decreased since her last visit on 7/12.   Plan:   - Uric acid level was done today to establish baseline in case gout attacks recur.  - Advised pt to take colchicine if she has another attack  Patient seen with Dr. Thomasene Ripple, M.D. [facility], PGY-1 Phone: [phone] Date 07/24/2021 Time 9:06 PM

## 2021-07-24 NOTE — Assessment & Plan Note (Signed)
Pt is here for a follow up regarding her H.Pylori infection. She was supposed to receive her Urea Breath test for confirmation of the infection going away last week, however due to lack of supplies we were unable to do it. She denies any abdominal pain, or diarrhea.   Plan:   - Urea breath test was done today, will call patient upon results returning

## 2021-07-25 LAB — URIC ACID: Uric Acid: 9.5 mg/dL — ABNORMAL HIGH (ref 2.6–6.2)

## 2021-07-25 LAB — H. PYLORI BREATH TEST: H pylori Breath Test: NEGATIVE

## 2021-07-26 ENCOUNTER — Telehealth: Payer: Self-pay | Admitting: Student

## 2021-07-26 NOTE — Telephone Encounter (Signed)
Discussed results of H.Pylori Urea breath test and Uric acid test with patient on the phone. Reassured her breath test was negative. Uric acid level was 9.5, slightly over acceptable value, advised patient if she does have another gout attack to please come into the office and we will put her on prevention therapy as she doesn't meet criteria for preventive therapy at the moment.

## 2021-08-05 NOTE — Progress Notes (Signed)
Internal Medicine Clinic Attending  I saw and evaluated the patient.  I personally confirmed the key portions of the history and exam documented by the resident  and I reviewed pertinent patient test results.  The assessment, diagnosis, and plan were formulated together and I agree with the documentation in the resident's note.  

## 2021-10-28 ENCOUNTER — Other Ambulatory Visit: Payer: Self-pay | Admitting: Obstetrics and Gynecology

## 2021-10-28 DIAGNOSIS — Z1231 Encounter for screening mammogram for malignant neoplasm of breast: Secondary | ICD-10-CM

## 2021-12-09 ENCOUNTER — Other Ambulatory Visit: Payer: Self-pay

## 2021-12-09 DIAGNOSIS — I1A0 Resistant hypertension: Secondary | ICD-10-CM

## 2021-12-09 DIAGNOSIS — E782 Mixed hyperlipidemia: Secondary | ICD-10-CM

## 2021-12-11 MED ORDER — LISINOPRIL 10 MG PO TABS: 10.0000 mg | ORAL_TABLET | Freq: Every day | ORAL | 2 refills | Status: DC

## 2021-12-11 MED ORDER — TRIAMTERENE-HCTZ 37.5-25 MG PO TABS: 1.0000 | ORAL_TABLET | Freq: Every day | ORAL | 2 refills | Status: DC

## 2021-12-11 MED ORDER — ATORVASTATIN CALCIUM 80 MG PO TABS: 80.0000 mg | ORAL_TABLET | Freq: Every day | ORAL | 1 refills | Status: DC

## 2021-12-20 ENCOUNTER — Ambulatory Visit
Admission: RE | Admit: 2021-12-20 | Discharge: 2021-12-20 | Disposition: A | Discharge status: Home / Self Care | Payer: Commercial Managed Care - HMO | Source: Ambulatory Visit | Attending: Obstetrics and Gynecology | Admitting: Obstetrics and Gynecology

## 2021-12-20 DIAGNOSIS — Z1231 Encounter for screening mammogram for malignant neoplasm of breast: Secondary | ICD-10-CM

## 2022-04-01 ENCOUNTER — Emergency Department (HOSPITAL_COMMUNITY)
Admission: EM | Admit: 2022-04-01 | Discharge: 2022-04-02 | Disposition: A | Discharge status: Home / Self Care | Payer: Commercial Managed Care - HMO | Attending: Emergency Medicine | Admitting: Emergency Medicine

## 2022-04-01 ENCOUNTER — Encounter (HOSPITAL_COMMUNITY): Payer: Self-pay | Admitting: Emergency Medicine

## 2022-04-01 ENCOUNTER — Other Ambulatory Visit: Payer: Self-pay

## 2022-04-01 ENCOUNTER — Emergency Department (HOSPITAL_COMMUNITY): Payer: Commercial Managed Care - HMO

## 2022-04-01 ENCOUNTER — Ambulatory Visit (INDEPENDENT_AMBULATORY_CARE_PROVIDER_SITE_OTHER): Payer: Commercial Managed Care - HMO

## 2022-04-01 VITALS — BP 216/138 | HR 88 | Temp 98.2°F | Ht 68.0 in | Wt 180.9 lb

## 2022-04-01 DIAGNOSIS — K219 Gastro-esophageal reflux disease without esophagitis: Secondary | ICD-10-CM | POA: Insufficient documentation

## 2022-04-01 DIAGNOSIS — F17210 Nicotine dependence, cigarettes, uncomplicated: Secondary | ICD-10-CM | POA: Insufficient documentation

## 2022-04-01 DIAGNOSIS — I10 Essential (primary) hypertension: Secondary | ICD-10-CM

## 2022-04-01 DIAGNOSIS — I1A0 Resistant hypertension: Secondary | ICD-10-CM | POA: Diagnosis not present

## 2022-04-01 DIAGNOSIS — I161 Hypertensive emergency: Secondary | ICD-10-CM

## 2022-04-01 DIAGNOSIS — Z79899 Other long term (current) drug therapy: Secondary | ICD-10-CM | POA: Diagnosis not present

## 2022-04-01 DIAGNOSIS — R1084 Generalized abdominal pain: Secondary | ICD-10-CM | POA: Diagnosis not present

## 2022-04-01 DIAGNOSIS — Z0000 Encounter for general adult medical examination without abnormal findings: Secondary | ICD-10-CM

## 2022-04-01 DIAGNOSIS — R079 Chest pain, unspecified: Secondary | ICD-10-CM

## 2022-04-01 DIAGNOSIS — R0789 Other chest pain: Secondary | ICD-10-CM | POA: Diagnosis present

## 2022-04-01 LAB — CBC
HCT: 44.3 % (ref 36.0–46.0)
Hemoglobin: 15.6 g/dL — ABNORMAL HIGH (ref 12.0–15.0)
MCH: 32.2 pg (ref 26.0–34.0)
MCHC: 35.2 g/dL (ref 30.0–36.0)
MCV: 91.3 fL (ref 80.0–100.0)
Platelets: 167 10*3/uL (ref 150–400)
RBC: 4.85 MIL/uL (ref 3.87–5.11)
RDW: 12.3 % (ref 11.5–15.5)
WBC: 10.5 10*3/uL (ref 4.0–10.5)
nRBC: 0 % (ref 0.0–0.2)

## 2022-04-01 LAB — BASIC METABOLIC PANEL
Anion gap: 13 (ref 5–15)
BUN: 14 mg/dL (ref 6–20)
CO2: 22 mmol/L (ref 22–32)
Calcium: 9.3 mg/dL (ref 8.9–10.3)
Chloride: 101 mmol/L (ref 98–111)
Creatinine, Ser: 0.88 mg/dL (ref 0.44–1.00)
GFR, Estimated: >60 mL/min (ref >60)
Glucose, Bld: 66 mg/dL — ABNORMAL LOW (ref 70–99)
Potassium: 3.4 mmol/L — ABNORMAL LOW (ref 3.5–5.1)
Sodium: 136 mmol/L (ref 135–145)

## 2022-04-01 LAB — TROPONIN I (HIGH SENSITIVITY)
Troponin I (High Sensitivity): 23 ng/L — ABNORMAL HIGH (ref <18)
Troponin I (High Sensitivity): 19 ng/L — ABNORMAL HIGH (ref <18)

## 2022-04-01 LAB — CBG MONITORING, ED: Glucose-Capillary: 93 mg/dL (ref 70–99)

## 2022-04-01 MED ORDER — LIDOCAINE VISCOUS HCL 2 % MT SOLN
15.0000 mL | Freq: Once | OROMUCOSAL | Status: AC
  Administered 2022-04-01: 15 mL via ORAL
  Filled 2022-04-01: qty 15

## 2022-04-01 MED ORDER — TRIAMTERENE-HCTZ 37.5-25 MG PO TABS: 1.0000 | ORAL_TABLET | Freq: Every day | ORAL | 2 refills | Status: DC

## 2022-04-01 MED ORDER — CLONIDINE HCL 0.2 MG PO TABS
0.2000 mg | ORAL_TABLET | Freq: Once | ORAL | Status: AC
  Administered 2022-04-01: 0.2 mg via ORAL
  Filled 2022-04-01: qty 1

## 2022-04-01 MED ORDER — CLONIDINE HCL 0.2 MG PO TABS: 0.2000 mg | ORAL_TABLET | Freq: Three times a day (TID) | ORAL | 0 refills | Status: DC | PRN

## 2022-04-01 MED ORDER — HYDROXYZINE HCL 25 MG PO TABS
25.0000 mg | ORAL_TABLET | Freq: Once | ORAL | Status: AC
  Administered 2022-04-01: 25 mg via ORAL
  Filled 2022-04-01: qty 1

## 2022-04-01 MED ORDER — ALUM & MAG HYDROXIDE-SIMETH 200-200-20 MG/5ML PO SUSP
30.0000 mL | Freq: Once | ORAL | Status: AC
  Administered 2022-04-01: 30 mL via ORAL
  Filled 2022-04-01: qty 30

## 2022-04-01 MED ORDER — TRIAMTERENE-HCTZ 37.5-25 MG PO TABS
1.0000 | ORAL_TABLET | Freq: Every day | ORAL | Status: DC
  Administered 2022-04-01: 1 via ORAL
  Filled 2022-04-01: qty 1

## 2022-04-01 NOTE — ED Notes (Signed)
Provider at bedside

## 2022-04-01 NOTE — Progress Notes (Addendum)
CC: Blood pressure, abdominal pain  HPI:  Katelyn [name] is a 47 y.o. with past medical history as below who presents for routine checkup.  See detailed assessment and plan for HPI.  Past Medical History:  Diagnosis Date   Abdominal pain, RLQ 06/23/2014   AKI (acute kidney injury) ([place]) 01/14/2015   Anemia    Anxiety    Chest pain    Depression    Elevated LFTs 04/26/2012   Hypertension    Hypertensive urgency 06/13/2018   Migraine    Palpitations    Review of Systems: See detailed assessment and plan for pertinent ROS.  Physical Exam:  Vitals:   04/01/22 1413 04/01/22 1430  BP: (!) 231/138 (!) 216/138  Pulse: 95 88  Temp: 98.2 F (36.8 C)   TempSrc: Oral   SpO2: 100%   Weight: 180 lb 14.4 oz (82.1 kg)   Height: 5\' 8"  (1.727 m)    Physical Exam Constitutional:      Appearance: She is not toxic-appearing.  HENT:     Head: Normocephalic and atraumatic.  Cardiovascular:     Rate and Rhythm: Normal rate and regular rhythm.  Pulmonary:     Effort: Pulmonary effort is normal.     Breath sounds: No wheezing, rhonchi or rales.  Abdominal:     General: There is no distension.     Palpations: Abdomen is soft.     Tenderness: There is no abdominal tenderness.  Musculoskeletal:     Right lower leg: No edema.     Left lower leg: No edema.  Skin:    General: Skin is warm and dry.  Neurological:     Mental Status: She is alert and oriented to person, place, and time.  Psychiatric:        Mood and Affect: Mood is anxious.        Behavior: Behavior normal.      Assessment & Plan:   See Encounters Tab for problem based charting.  Resistant hypertension Patient presents with history of resistant hypertension.  Currently treated with lisinopril 10 mg daily, triamterene-hydrochlorothiazide 37.5-25 mg daily, and metoprolol tartrate 50 mg twice daily.  Blood pressure today 231/138 initially, 216/138 on repeat.  This is significantly worsened from last follow-up.   Patient has been taking her lisinopril and metoprolol but has not been taking her Maxzide.  Reports chest pain since this morning.  Feels like pressure at central chest without radiation and without worsening on exertion.  Endorses occasional sharp component to pain.  Has some pain upon palpation of sternum.  Lungs clear on exam, no lower extremity edema.  Echo in 2018 with grade 1 diastolic dysfunction.  Patient denies headache, vision changes, swelling.  Given severe symptomatic hypertension, feel ED evaluation warranted for more emergent workup and possible need for IV antihypertensives. -Will refer patient to emergency room -Refill triamterene hydrochlorothiazide -Follow-up as soon as possible to continue medication management  Addendum: Called patient after being treated in ED. Discharged with clonidine. We discussed that we would d/c clonidine and instead increase her lisinopril. Encouraged her to schedule f/u with [place] in clinic as soon as possible.  Abdominal pain Patient presents with 1 week history of generalized abdominal pain.  Denies nausea or vomiting but endorses softer stools every other day.  Reports that symptoms may be consistent with prior episodes of reflux.  Denies fevers or chills.  Denies sick contacts.  She is afebrile.  Abdominal exam without focal tenderness. Feel this is likely indigestion  versus viral gastroenteritis. -Discussed that this is unlikely anything worrisome and should resolve in the next week or so -Continue previous acid reflux management    Patient discussed with Dr.  Saverio Danker

## 2022-04-01 NOTE — Patient Instructions (Signed)
Ms.Allea Gordy Levan, it was a pleasure seeing you today!  Today we discussed: High blood pressure - will have you go to emergency room for further evaluation and treatment. Please call clinic to schedule follow up appointment in the next two weeks. I have refilled your blood pressure medication. Abdominal pain - I suspect this is a viral gastroenteritis or indigestion which should subside in the next week or so. Please let us know if your symptoms worsen or fail to improve.  I have ordered the following labs today:  Lab Orders  No laboratory test(s) ordered today     Tests ordered today:  none  Referrals ordered today:   Referral Orders  No referral(s) requested today     I have ordered the following medication/changed the following medications:   Stop the following medications: Medications Discontinued During This Encounter  Medication Reason   triamterene-hydrochlorothiazide (MAXZIDE-25) 37.5-25 MG tablet Reorder     Start the following medications: Meds ordered this encounter  Medications   triamterene-hydrochlorothiazide (MAXZIDE-25) 37.5-25 MG tablet    Sig: Take 1 tablet by mouth daily.    Dispense:  60 tablet    Refill:  2     Follow-up:  2 weeks    Please make sure to arrive 15 minutes prior to your next appointment. If you arrive late, you may be asked to reschedule.   We look forward to seeing you next time. Please call our clinic at [phone] if you have any questions or concerns. The best time to call is Monday-Friday from 9am-4pm, but there is someone available 24/7. If after hours or the weekend, call the main hospital number and ask for the Internal Medicine Resident On-Call. If you need medication refills, please notify your pharmacy one week in advance and they will send [place] a request.  Thank you for letting us take part in your care. Wishing you the best!  Thank you, Linward Natal, MD

## 2022-04-01 NOTE — ED Triage Notes (Signed)
Pt sent from pcp for hypertension and chest pain. Pt c/o intermittent centralized chest pressure without radiation of pain. Pt's also states her bp was elevated at 216/138 at appointment. On multiple blood pressure medications but states she has been out of maxzide for 2 days.

## 2022-04-01 NOTE — Discharge Instructions (Signed)
Please begin taking the blood pressure medication that you had run out of daily as prescribed, you can take the new clonidine medication up to every 8 hours as needed if your systolic blood pressure is greater than [id number] or your diastolic blood pressure greater than 100.  If you have return of significant chest pain, especially worse with walking around, associated with nausea, shortness of breath I recommend returning to the emergency department, if chest pain again feels like indigestion or gas pressure you may try Maalox, Mylanta, Tums over-the-counter, you may want to talk to your primary care doctor about establishing a GI follow-up if you have significant ongoing stomach pain despite treatment.

## 2022-04-01 NOTE — Assessment & Plan Note (Addendum)
Patient presents with history of resistant hypertension.  Currently treated with lisinopril 10 mg daily, triamterene-hydrochlorothiazide 37.5-25 mg daily, and metoprolol tartrate 50 mg twice daily.  Blood pressure today 231/138 initially, 216/138 on repeat.  This is significantly worsened from last follow-up.  Patient has been taking her lisinopril and metoprolol but has not been taking her Maxzide.  Reports chest pain since this morning.  Feels like pressure at central chest without radiation and without worsening on exertion.  Endorses occasional sharp component to pain.  Has some pain upon palpation of sternum.  Lungs clear on exam, no lower extremity edema.  Echo in 2018 with grade 1 diastolic dysfunction.  Patient denies headache, vision changes, swelling.  Given severe symptomatic hypertension, feel ED evaluation warranted for more emergent workup and possible need for IV antihypertensives. -Will refer patient to emergency room -Refill triamterene hydrochlorothiazide -Follow-up as soon as possible to continue medication management  Addendum: Called patient after being treated in ED. Discharged with clonidine. We discussed that we would d/c clonidine and instead increase her lisinopril. Encouraged her to schedule f/u with [place] in clinic as soon as possible.

## 2022-04-01 NOTE — Assessment & Plan Note (Signed)
Patient presents with 1 week history of generalized abdominal pain.  Denies nausea or vomiting but endorses softer stools every other day.  Reports that symptoms may be consistent with prior episodes of reflux.  Denies fevers or chills.  Denies sick contacts.  She is afebrile.  Abdominal exam without focal tenderness. Feel this is likely indigestion versus viral gastroenteritis. -Discussed that this is unlikely anything worrisome and should resolve in the next week or so -Continue previous acid reflux management

## 2022-04-01 NOTE — ED Provider Notes (Signed)
[facility] Provider Note   CSN: [id number] Arrival date & time: 04/01/22  1514     History {Add pertinent medical, surgical, social history, OB history to HPI:1} Chief Complaint  Patient presents with   Chest Pain    Katelyn [name] is a 47 y.o. female with past medical history significant for panic disorder, hypertension, anxiety, hyperlipidemia, acid reflux with previous H. pylori infection, tobacco abuse, patient reports that she smokes about half pack per day.  She was sent over from PCP for uncontrolled hypertension and chest pain.  She reports intermittent central chest pressure without radiation since last night.  Patient does report that she feels some pain like indigestion like she needs to burp.  Blood pressure at appointment was 216/138, at arrival 218/145.  She is on multiple blood pressure medications including metoprolol, lisinopril, and Maxzide, but reports that she has not been able to take the latter for the last 2 to 3 days because of running out of the medication.  She denies any nausea, she reports some chest tightness but denies overt shortness of breath.  She denies exertional component of the chest pain, no previous CAD, or ACS.  No previous stroke.   Chest Pain      Home Medications Prior to Admission medications   Medication Sig Start Date End Date Taking? Authorizing Provider  acetaminophen (TYLENOL) 500 MG tablet Take 1,000 mg by mouth daily as needed for headache (pain).    [provider]  aspirin-sod bicarb-citric acid (ALKA-SELTZER) 325 MG TBEF tablet Take 650 mg by mouth daily as needed (heartburn).    [provider]  atorvastatin (LIPITOR) 80 MG tablet Take 1 tablet (80 mg total) by mouth daily. 12/11/21   Farrel Gordon, DO  Calcium Carbonate Antacid (ALKA-SELTZER ANTACID PO) Take 1 tablet by mouth daily as needed (stomach acid).    [provider]  colchicine 0.6 MG tablet Take 1  tablet (0.6 mg total) by mouth 2 (two) times daily for 7 days. 07/17/21 07/24/21  Nooruddin, Marlene Lard, MD  EPINEPHrine 0.3 mg/0.3 mL IJ SOAJ injection Inject 0.3 mg into the muscle as needed for anaphylaxis. 07/02/21   Lacretia Leigh, MD  hydrOXYzine (ATARAX) 25 MG tablet Take 1 tablet (25 mg total) by mouth every 6 (six) hours as needed for anxiety. Patient not taking: Reported on 07/02/2021 02/27/21   Lacinda Axon, MD  lisinopril (ZESTRIL) 10 MG tablet Take 1 tablet (10 mg total) by mouth daily. 12/11/21   Farrel Gordon, DO  metoprolol tartrate (LOPRESSOR) 50 MG tablet Take 1 tablet (50 mg total) by mouth 2 (two) times daily. 07/03/21 07/03/22  Lacinda Axon, MD  triamterene-hydrochlorothiazide (MAXZIDE-25) 37.5-25 MG tablet Take 1 tablet by mouth daily. 04/01/22   Linward Natal, MD  citalopram (CELEXA) 10 MG tablet Take 1 tablet (10 mg total) by mouth daily. 09/12/10 03/20/11  Gregor Hams, MD  potassium chloride SA (K-DUR,KLOR-CON) 20 MEQ tablet Take 1 tablet (20 mEq total) by mouth daily. For 7 days 08/21/10 03/20/11  Katherina Mires, MD      Allergies    Metoprolol    Review of Systems   Review of Systems  Cardiovascular:  Positive for chest pain.  All other systems reviewed and are negative.   Physical Exam Updated Vital Signs BP (!) 218/145 (BP Location: Right Arm)   Pulse 85   Temp 98.1 F (36.7 C) (Oral)   Resp (!) 21   Ht 5\' 8"  (  1.727 m)   Wt 81.6 kg   SpO2 100%   BMI 27.37 kg/m  Physical Exam Vitals and nursing note reviewed.  Constitutional:      General: She is not in acute distress.    Appearance: Normal appearance.  HENT:     Head: Normocephalic and atraumatic.  Eyes:     General:        Right eye: No discharge.        Left eye: No discharge.  Cardiovascular:     Rate and Rhythm: Normal rate and regular rhythm.     Heart sounds: No murmur heard.    No friction rub. No gallop.  Pulmonary:     Effort: Pulmonary effort is normal.     Breath sounds: Normal  breath sounds.  Chest:     Comments: No tenderness to palpation of chest wall, or epigastric region Abdominal:     General: Bowel sounds are normal.     Palpations: Abdomen is soft.  Skin:    General: Skin is warm and dry.     Capillary Refill: Capillary refill takes less than 2 seconds.  Neurological:     Mental Status: She is alert and oriented to person, place, and time.  Psychiatric:        Mood and Affect: Mood normal.        Behavior: Behavior normal.     ED Results / Procedures / Treatments   Labs (all labs ordered are listed, but only abnormal results are displayed) Labs Reviewed  BASIC METABOLIC PANEL  CBC  TROPONIN I (HIGH SENSITIVITY)    EKG None  Radiology No results found.  Procedures Procedures  {Document cardiac monitor, telemetry assessment procedure when appropriate:1}  Medications Ordered in ED Medications  alum & mag hydroxide-simeth (MAALOX/MYLANTA) 200-200-20 MG/5ML suspension 30 mL (has no administration in time range)    And  lidocaine (XYLOCAINE) 2 % viscous mouth solution 15 mL (has no administration in time range)  triamterene-hydrochlorothiazide (MAXZIDE-25) 37.5-25 MG per tablet 1 tablet (has no administration in time range)  hydrOXYzine (ATARAX) tablet 25 mg (has no administration in time range)    ED Course/ Medical Decision Making/ A&P   {   Click here for ABCD2, HEART and other calculatorsREFRESH Note before signing :1}                          Medical Decision Making Amount and/or Complexity of Data Reviewed Labs: ordered. Radiology: ordered.  Risk OTC drugs. Prescription drug management.   This patient is a 47 y.o. female  who presents to the ED for concern of chest pain, poorly controlled hypertension.   Differential diagnoses prior to evaluation: The emergent differential diagnosis includes, but is not limited to,  ACS, AAS, PE, Mallory-Weiss, Boerhaave's, Pneumonia, acute bronchitis, asthma or COPD exacerbation,  anxiety, MSK pain or traumatic injury to the chest, acid reflux versus other . This is not an exhaustive differential.   Past Medical History / Co-morbidities: ***  Additional history: Chart reviewed. Pertinent results include: ***  Physical Exam: Physical exam performed. The pertinent findings include: ***  Lab Tests/Imaging studies: I personally interpreted labs/imaging and the pertinent results include:  ***. ***I agree with the radiologist interpretation.  Cardiac monitoring: EKG obtained and interpreted by my attending physician which shows: ***   Medications: I ordered medication including ***.  I have reviewed the patients home medicines and have made adjustments as needed.   Disposition: After consideration  of the diagnostic results and the patients response to treatment, I feel that *** .   ***emergency department workup does not suggest an emergent condition requiring admission or immediate intervention beyond what has been performed at this time. The plan is: ***. The patient is safe for discharge and has been instructed to return immediately for worsening symptoms, change in symptoms or any other concerns.  Final Clinical Impression(s) / ED Diagnoses Final diagnoses:  None    Rx / DC Orders ED Discharge Orders     None

## 2022-04-01 NOTE — ED Notes (Signed)
PT to imaging

## 2022-04-02 MED ORDER — LISINOPRIL 20 MG PO TABS: 20.0000 mg | ORAL_TABLET | Freq: Every day | ORAL | 11 refills | Status: DC

## 2022-04-02 NOTE — Addendum Note (Signed)
Addended by: Linward Natal on: 04/02/2022 01:31 PM   Modules accepted: Orders

## 2022-04-03 NOTE — Addendum Note (Signed)
Addended by: Charise Killian on: 04/03/2022 10:46 AM   Modules accepted: Level of Service

## 2022-04-03 NOTE — Progress Notes (Signed)
Internal Medicine Clinic Attending  Case discussed with Dr. Dema Severin  At the time of the visit.  We reviewed the resident's history and exam and pertinent patient test results.  I agree with the assessment, diagnosis, and plan of care documented in the resident's note. Close f/u following ED evaluation for ongoing BP management.

## 2022-04-15 ENCOUNTER — Ambulatory Visit (INDEPENDENT_AMBULATORY_CARE_PROVIDER_SITE_OTHER): Payer: Self-pay | Admitting: Student

## 2022-04-15 ENCOUNTER — Encounter: Payer: Self-pay | Admitting: Student

## 2022-04-15 VITALS — BP 130/99 | HR 71 | Temp 98.4°F | Wt 176.2 lb

## 2022-04-15 DIAGNOSIS — F321 Major depressive disorder, single episode, moderate: Secondary | ICD-10-CM

## 2022-04-15 DIAGNOSIS — F1010 Alcohol abuse, uncomplicated: Secondary | ICD-10-CM

## 2022-04-15 DIAGNOSIS — F419 Anxiety disorder, unspecified: Secondary | ICD-10-CM

## 2022-04-15 DIAGNOSIS — F32A Depression, unspecified: Secondary | ICD-10-CM

## 2022-04-15 DIAGNOSIS — R109 Unspecified abdominal pain: Secondary | ICD-10-CM

## 2022-04-15 DIAGNOSIS — R1013 Epigastric pain: Secondary | ICD-10-CM

## 2022-04-15 DIAGNOSIS — I1A0 Resistant hypertension: Secondary | ICD-10-CM

## 2022-04-15 DIAGNOSIS — F411 Generalized anxiety disorder: Secondary | ICD-10-CM

## 2022-04-15 MED ORDER — HYDROXYZINE HCL 25 MG PO TABS: 25.0000 mg | ORAL_TABLET | Freq: Four times a day (QID) | ORAL | 2 refills | Status: DC | PRN

## 2022-04-15 MED ORDER — PANTOPRAZOLE SODIUM 40 MG PO TBEC: 40.0000 mg | DELAYED_RELEASE_TABLET | Freq: Every day | ORAL | 1 refills | Status: DC

## 2022-04-15 MED ORDER — SERTRALINE HCL 25 MG PO TABS: 25.0000 mg | ORAL_TABLET | Freq: Every day | ORAL | 2 refills | Status: DC

## 2022-04-15 MED ORDER — LISINOPRIL 30 MG PO TABS: 30.0000 mg | ORAL_TABLET | Freq: Every day | ORAL | 5 refills | Status: DC

## 2022-04-15 MED ORDER — TRIAMTERENE-HCTZ 37.5-25 MG PO TABS: 1.0000 | ORAL_TABLET | Freq: Every day | ORAL | 3 refills | Status: DC

## 2022-04-15 NOTE — Patient Instructions (Signed)
Thank you, Ms.Mohammed Kindle Halle for allowing [place] to provide your care today. Today we discussed your high blood pressure, abdominal pain, recent alcohol use, anxiety and depression.  Thank you for being honest with me in clinic today.  I encourage you to cut down your alcohol use and let us know if you need help quitting.  I have started you on medications to help with your anxiety and depression as well as your indigestion.  I have ordered the following labs for you:  Lab Orders         BMP8+Anion Gap      I will call if any are abnormal. All of your labs can be accessed through "My Chart".  I have place a referrals to cognitive behavioral therapy  I have ordered the following medication/changed the following medications:  Start sertraline 25 mg daily Start Protonix 40 mg daily Increase lisinopril to 30 mg daily Continue hydroxyzine 25 mg every 6 hours as needed for anxiety  My Chart Access: [email or web address]  Please follow-up in 4 weeks  Please make sure to arrive 15 minutes prior to your next appointment. If you arrive late, you may be asked to reschedule.    We look forward to seeing you next time. Please call our clinic at [phone] if you have any questions or concerns. The best time to call is Monday-Friday from 9am-4pm, but there is someone available 24/7. If after hours or the weekend, call the main hospital number and ask for the Internal Medicine Resident On-Call. If you need medication refills, please notify your pharmacy one week in advance and they will send [place] a request.   Thank you for letting us take part in your care. Wishing you the best!  Steffanie Rainwater, MD 04/15/2022, 11:25 AM IM Resident, PGY-3 [facility] Heck 41:10

## 2022-04-15 NOTE — Progress Notes (Unsigned)
CC: Follow-up  HPI:  [name] is a 47 y.o. female with PMH as below who presents to clinic to follow-up on her chronic medical problems. Please see problem based charting for evaluation, assessment and plan.  Past Medical History:  Diagnosis Date   Abdominal pain, RLQ 06/23/2014   AKI (acute kidney injury) 01/14/2015   Anemia    Anxiety    Chest pain    Depression    Elevated LFTs 04/26/2012   Hypertension    Hypertensive urgency 06/13/2018   Migraine    Palpitations     Review of Systems:  Constitutional: Negative for fever, headache or fatigue Eyes: Negative for visual changes Cardiac: Negative for chest pain Abdomen: Positive for heartburn, indigestion and occasional nausea.  Negative for diarrhea or constipation. Psych: Positive for depression and anxiety  Physical Exam: General: Pleasant, well-appearing middle-aged woman.  No acute distress. Cardiac: RRR. No murmurs, rubs or gallops. No LE edema Respiratory: Lungs CTAB. No wheezing or crackles. Abdominal: Mild epigastric tenderness.  Nondistended.  Normal BS. Skin: Warm, dry and intact without rashes or lesions Extremities: Atraumatic. Full ROM. Palpable radial and DP pulses. Neuro: A&O x 3. Moves all extremities. Normal sensation to gross touch. Psych: Depressed mood.  Vitals:   04/15/22 1022 04/15/22 1119  BP: (!) 144/112 (!) 130/99  Pulse: 76 71  Temp: 98.4 F (36.9 C)   TempSrc: Oral   SpO2: 99%   Weight: 176 lb 3.2 oz (79.9 kg)     Assessment & Plan:   Resistant hypertension Patient here for 2 weeks office follow-up after being sent to the ER for severe symptomatic hypertension. Workup in the ER was overall reassuring so patient was discharged home with her home meds with addition of clonidine. Our team later called patient to discontinue clonidine and instead increase her lisinopril to 20 mg. Patient presents today with resolution of her symptoms including the headache and chest pain. BP has  significantly improved on current regimen but still elevated with SBP in the 130s to 140s. BMP today shows normal kidney function.  Will increase her lisinopril to 30 mg today with plan to follow-up with 4 weeks for repeat BP.  Plan: -Increase lisinopril to 30 mg daily -Continue metoprolol 50 mg twice daily -Continue triamterene-HCTZ 37.5-25 mg daily -Follow-up in 4 weeks for repeat BP and BMP  Depression Patient reports feeling depressed last 4 weeks causing her to increase her alcohol intake.  States her ex-husband passed away earlier this year and they have 2 children together. 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 Her mother was diagnosed with ovarian cancer about 2 weeks ago. She states worrying about her health and these recent family and personal stressors have affected her mood.  She has increased her alcohol use from a few glasses of wine on the weekends to 6-10 airplane bottles of liquor a day to cope. PHQ-9 today is 12. Patient is interested in getting treatment for her depression and anxiety so she does not have to use alcohol. Discussed plan to start SSRI today and referral for CBT.  Plan: -Start sertraline 25 mg daily -Referral to integrated behavioral health for CBT -Follow-up in 4 weeks for reevaluation  GAD (generalized anxiety disorder) Patient here to follow-up on her generalized anxiety disorder.  States her anxiety has worsened in the last 2 weeks in the setting of her health issues with her elevated blood pressure as well as a recent diagnosis of ovarian cancer in  her mother. GAD-7 score is 19 today. Patient admits to using alcohol to cope with her symptoms but she is ready to get long-term treatment for anxiety so she can stop her alcohol use which she agrees is contributing to her abdominal pain.   Plan: -Start sertraline 25 mg daily -Continue hydroxyzine 25 mg every 6 hours as needed for anxiety attacks -Referral to integrated behavioral health for  CBT -Follow-up in 4 weeks for reevaluation  Abdominal pain Patient here to follow-up on her abdominal pain that started about 3 weeks ago. She describes the pain as indigestion and reflux mostly after she eats. She has a history of H. pylori infection that was treated last year with resolution of her symptoms. On further questioning, patient reports that she noticed that she started having abdominal pain a week after increasing her alcohol intake. She reports some associated nausea and bloating.  She has been taking Alka-Seltzer and Pepto-Bismol which gives her some relief.  On exam, patient has mild epigastric tenderness.  Based on her history, I am concerned for possible gastritis in the setting of alcohol use as well as worsening GERD symptoms.  Counseled patient on limiting her alcohol intake and discussed plan to start with PPI.  Patient's BMP today showed elevated calcium to 10.5 so I advised her to stop taking the Alka-Seltzer which is likely the cause of her mild hypercalcemia.  Plan: -Start Protonix 40 mg daily -Discontinue Alka-Seltzer -Follow-up in 4 weeks for reevaluation, consider retesting for H. pylori if symptoms have not resolved or improved on PPI and limiting ETOH intake  Alcohol abuse Patient admits today that she has significantly increased her alcohol intake in the last 4 weeks. Previously, she drank 1-2 glasses of wine on weekends. However, since 4 weeks ago, she has been drinking 6-10 airplane bottles a day to cope with recent stressors in her life including her ex-husband passing away and losing her job. She denies having problems with alcohol use in the past and does not think that alcohol use has significantly interfered with her life or relationships. She does understand that the alcohol use has worsened her GERD symptoms. I counseled patient on alcohol cessation and offered medication to help with the cravings.  Patient states she understands that increasing her alcohol  intake has not fixed her problems so plans to cut down herself. Discussed plan to follow-up with her in 4 weeks to see how she is doing and to reassess need to start treatment for the alcohol abuse.  Plan: -Follow-up in 4 weeks for reevaluation and initiation naltrexone if necessary.  -Provided her with information on effects of ETOH abuse    See Encounters Tab for problem based charting.  Patient discussed with Dr. Anthoney Harada, MD, MPH

## 2022-04-16 ENCOUNTER — Encounter: Payer: Self-pay | Admitting: Student

## 2022-04-16 DIAGNOSIS — F1090 Alcohol use, unspecified, uncomplicated: Secondary | ICD-10-CM | POA: Insufficient documentation

## 2022-04-16 DIAGNOSIS — F32A Depression, unspecified: Secondary | ICD-10-CM | POA: Insufficient documentation

## 2022-04-16 DIAGNOSIS — F1010 Alcohol abuse, uncomplicated: Secondary | ICD-10-CM | POA: Insufficient documentation

## 2022-04-16 LAB — BMP8+ANION GAP
Anion Gap: 17 mmol/L (ref 10.0–18.0)
BUN/Creatinine Ratio: 14 (ref 9–23)
BUN: 14 mg/dL (ref 6–24)
CO2: 19 mmol/L — ABNORMAL LOW (ref 20–29)
Calcium: 10.5 mg/dL — ABNORMAL HIGH (ref 8.7–10.2)
Chloride: 100 mmol/L (ref 96–106)
Creatinine, Ser: 0.97 mg/dL (ref 0.57–1.00)
Glucose: 94 mg/dL (ref 70–99)
Potassium: 4.3 mmol/L (ref 3.5–5.2)
Sodium: 136 mmol/L (ref 134–144)
eGFR: 72 mL/min/{1.73_m2} (ref >59)

## 2022-04-16 NOTE — Assessment & Plan Note (Signed)
Patient admits today that she has significantly increased her alcohol intake in the last 4 weeks. Previously, she drank 1-2 glasses of wine on weekends. However, since 4 weeks ago, she has been drinking 6-10 airplane bottles a day to cope with recent stressors in her life including her ex-husband passing away and losing her job. She denies having problems with alcohol use in the past and does not think that alcohol use has significantly interfered with her life or relationships. She does understand that the alcohol use has worsened her GERD symptoms. I counseled patient on alcohol cessation and offered medication to help with the cravings.  Patient states she understands that increasing her alcohol intake has not fixed her problems so plans to cut down herself. Discussed plan to follow-up with her in 4 weeks to see how she is doing and to reassess need to start treatment for the alcohol abuse.  Plan: -Follow-up in 4 weeks for reevaluation and initiation naltrexone if necessary.  -Provided her with information on effects of ETOH abuse

## 2022-04-16 NOTE — Progress Notes (Signed)
Internal Medicine Clinic Attending  Case discussed with Dr. Amponsah  At the time of the visit.  We reviewed the resident's history and exam and pertinent patient test results.  I agree with the assessment, diagnosis, and plan of care documented in the resident's note.  

## 2022-04-16 NOTE — Assessment & Plan Note (Signed)
Patient reports feeling depressed last 4 weeks causing her to increase her alcohol intake.  States her ex-husband passed away earlier this year and they have 2 children together. 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 Her mother was diagnosed with ovarian cancer about 2 weeks ago. She states worrying about her health and these recent family and personal stressors have affected her mood.  She has increased her alcohol use from a few glasses of wine on the weekends to 6-10 airplane bottles of liquor a day to cope. PHQ-9 today is 12. Patient is interested in getting treatment for her depression and anxiety so she does not have to use alcohol. Discussed plan to start SSRI today and referral for CBT.  Plan: -Start sertraline 25 mg daily -Referral to integrated behavioral health for CBT -Follow-up in 4 weeks for reevaluation

## 2022-04-16 NOTE — Assessment & Plan Note (Signed)
Patient here to follow-up on her generalized anxiety disorder.  States her anxiety has worsened in the last 2 weeks in the setting of her health issues with her elevated blood pressure as well as a recent diagnosis of ovarian cancer in her mother. GAD-7 score is 19 today. Patient admits to using alcohol to cope with her symptoms but she is ready to get long-term treatment for anxiety so she can stop her alcohol use which she agrees is contributing to her abdominal pain.   Plan: -Start sertraline 25 mg daily -Continue hydroxyzine 25 mg every 6 hours as needed for anxiety attacks -Referral to integrated behavioral health for CBT -Follow-up in 4 weeks for reevaluation

## 2022-04-16 NOTE — Assessment & Plan Note (Signed)
Patient here to follow-up on her abdominal pain that started about 3 weeks ago. She describes the pain as indigestion and reflux mostly after she eats. She has a history of H. pylori infection that was treated last year with resolution of her symptoms. On further questioning, patient reports that she noticed that she started having abdominal pain a week after increasing her alcohol intake. She reports some associated nausea and bloating.  She has been taking Alka-Seltzer and Pepto-Bismol which gives her some relief.  On exam, patient has mild epigastric tenderness.  Based on her history, I am concerned for possible gastritis in the setting of alcohol use as well as worsening GERD symptoms.  Counseled patient on limiting her alcohol intake and discussed plan to start with PPI.  Patient's BMP today showed elevated calcium to 10.5 so I advised her to stop taking the Alka-Seltzer which is likely the cause of her mild hypercalcemia.  Plan: -Start Protonix 40 mg daily -Discontinue Alka-Seltzer -Follow-up in 4 weeks for reevaluation, consider retesting for H. pylori if symptoms have not resolved or improved on PPI and limiting ETOH intake

## 2022-04-16 NOTE — Assessment & Plan Note (Signed)
Patient here for 2 weeks office follow-up after being sent to the ER for severe symptomatic hypertension. Workup in the ER was overall reassuring so patient was discharged home with her home meds with addition of clonidine. Our team later called patient to discontinue clonidine and instead increase her lisinopril to 20 mg. Patient presents today with resolution of her symptoms including the headache and chest pain. BP has significantly improved on current regimen but still elevated with SBP in the 130s to 140s. BMP today shows normal kidney function.  Will increase her lisinopril to 30 mg today with plan to follow-up with 4 weeks for repeat BP.  Plan: -Increase lisinopril to 30 mg daily -Continue metoprolol 50 mg twice daily -Continue triamterene-HCTZ 37.5-25 mg daily -Follow-up in 4 weeks for repeat BP and BMP

## 2022-05-06 ENCOUNTER — Ambulatory Visit (INDEPENDENT_AMBULATORY_CARE_PROVIDER_SITE_OTHER): Payer: Self-pay | Admitting: Licensed Clinical Social Worker

## 2022-05-06 DIAGNOSIS — F321 Major depressive disorder, single episode, moderate: Secondary | ICD-10-CM

## 2022-05-06 NOTE — BH Specialist Note (Signed)
Integrated Behavioral Health via Telemedicine Visit  05/06/2022 Katelyn [name] [id number]  Number of Integrated Behavioral Health Clinician visits: 1- Initial Visit  Session Start time: 1030   Session End time: 1100  Total time in minutes: 30   Referring Provider: Steffanie Rainwater, MD Patient/Family location: Home Katelyn [name] Provider location: Office All persons participating in visit: patient and Katelyn [name] Types of Service: Introduction only  I connected with Katelyn [name]  via  Telephone or Video Enabled Telemedicine Application  (Video is Caregility application) and verified that I am speaking with the correct person using two identifiers. Discussed confidentiality: Yes   I discussed the limitations of telemedicine and the availability of in person appointments.  Discussed there is a possibility of technology failure and discussed alternative modes of communication if that failure occurs.  I discussed that engaging in this telemedicine visit, they consent to the provision of behavioral healthcare.  Patient and/or legal guardian expressed understanding and consented to Telemedicine visit: Yes   Presenting Concerns: Patient and/or family reports the following symptoms/concerns: Depression and anxiety Duration of problem: Ongoing; Severity of problem: severe  Patient and/or Family's Strengths/Protective Factors: Sense of purpose  Goals Addressed: Patient will:  Reduce symptoms of: anxiety and depression    Progress towards Goals: Ongoing  Interventions: Interventions utilized:  Motivational Interviewing and Supportive Counseling Standardized Assessments completed: PHQ-SADS     04/15/2022   11:20 AM 04/15/2022   11:18 AM 04/01/2022    2:31 PM  PHQ-SADS Last 3 Score only  Total GAD-7 Score  19 14  PHQ Adolescent Score 12         Assessment: Patient currently experiencing anxiety and depression. Katelyn [name] introduced self during call , reviewed confidentialty and gave  patient Katelyn [name] contact information. Patient prefers in person meetings. Katelyn [name] sent request to have patient schedule on May 8th. Patient goals are to decrease anxiety and depression.  Patient may benefit from ongoing therapy.  Plan: Follow up with behavioral health clinician on : May 8, In-person , 2:30 Pm  I discussed the assessment and treatment plan with the patient and/or parent/guardian. They were provided an opportunity to ask questions and all were answered. They agreed with the plan and demonstrated an understanding of the instructions.   They were advised to call back or seek an in-person evaluation if the symptoms worsen or if the condition fails to improve as anticipated. Katelyn [name], MSW, LCSW-A She/Her Behavioral Health Clinician [name] Direct Dial:[phone]  Fax [phone] Main Office Phone: [phone] [address] [place] [place], [place] [address] Website: [name]  [place], [place]  [place]

## 2022-05-07 ENCOUNTER — Encounter: Payer: Self-pay | Admitting: Internal Medicine

## 2022-05-07 ENCOUNTER — Ambulatory Visit: Payer: Medicaid Other | Attending: Internal Medicine | Admitting: Internal Medicine

## 2022-05-07 VITALS — BP 118/92 | HR 89 | Ht 68.0 in | Wt 176.2 lb

## 2022-05-07 DIAGNOSIS — R0789 Other chest pain: Secondary | ICD-10-CM

## 2022-05-07 DIAGNOSIS — E782 Mixed hyperlipidemia: Secondary | ICD-10-CM

## 2022-05-07 MED ORDER — ATORVASTATIN CALCIUM 80 MG PO TABS: 80.0000 mg | ORAL_TABLET | Freq: Every day | ORAL | 3 refills | Status: DC

## 2022-05-07 NOTE — Patient Instructions (Signed)
Medication Instructions:  CONTINUE all other current medications  Atorvastatin has been refilled  *If you need a refill on your cardiac medications before your next appointment, please call your pharmacy*   Follow-Up: At [facility], you and your health needs are our priority.  As part of our continuing mission to provide you with exceptional heart care, we have created designated Provider Care Teams.  These Care Teams include your primary Cardiologist (physician) and Advanced Practice Providers (APPs -  Physician Assistants and Nurse Practitioners) who all work together to provide you with the care you need, when you need it.  We recommend signing up for the patient portal called "MyChart".  Sign up information is provided on this After Visit Summary.  MyChart is used to connect with patients for Virtual Visits (Telemedicine).  Patients are able to view lab/test results, encounter notes, upcoming appointments, etc.  Non-urgent messages can be sent to your provider as well.   To learn more about what you can do with MyChart, go to [email or web address].    Your next appointment:    3-4 months with Dr. Rennis Golden or NP/PA   Other Instructions  Please monitor your BP at home 1-2 times daily for about 7-10 days. Please call or send a message in MyChart with your readings.  HOW TO TAKE YOUR BLOOD PRESSURE: Rest 5 minutes before taking your blood pressure. Don't smoke or drink caffeinated beverages for at least 30 minutes before. Take your blood pressure before (not after) you eat. Sit comfortably with your back supported and both feet on the floor (don't cross your legs). Elevate your arm to heart level on a table or a desk. Use the proper sized cuff. It should fit smoothly and snugly around your bare upper arm. There should be enough room to slip a fingertip under the cuff. The bottom edge of the cuff should be 1 inch above the crease of the elbow. Ideally, take 3 measurements at one  sitting and record the average.

## 2022-05-07 NOTE — Progress Notes (Signed)
OFFICE CONSULT NOTE  Chief Complaint:  Uncontrolled hypertension, chest pain  Primary Care Physician: Steffanie Rainwater, MD  HPI:  Katelyn [name] is a 47 y.o. female who is being seen today for the evaluation of uncontrolled hypertension at the request of Montel Clock, Christian H, *.  This is a 47 year old female who is followed in the [facility] health family medicine clinic for hypertension.  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  She reports intermittent noncompliance with medications.  She has a longstanding history of hypertension dating back to her first pregnancy 1995 at which time she says she was diagnosed with preeclampsia and is struggled with hypertension since then.  There is a strong family history of hypertension in her family including her twin brothers, her mother and her father all on blood pressure medications.  Her blood pressure is been considered difficult to control.  Recently she had an increase in her lisinopril HCTZ which was doubled to 2 tablets daily.  The dose was 20/12.5 mg/tab.  She says on the 2 tablet dose she felt unwell and it was attributed she thought due to overdiuresis.  She has since decreased the dose back to 20/12.5 mg daily.  Her blood pressure today was elevated at 147/96.  She says she has never seen a blood pressure around what would be considered normal of 120/80 or less.  She denies any chest pain or worsening shortness of breath.  Recently she had a lipid profile which showed an LDL of 154.  She was accordingly started on atorvastatin 40 mg daily which she is taking without any side effects.  05/07/2022  [name] is seen today in follow-up.  She has had issues with uncontrolled hypertension.  Recently she was in the hospital emergency department for chest pain.  Blood pressure was 218/145.  She was treated initially with clonidine.  She had run out of a number of her medications.  After her blood pressure improved, she says she had  no further chest pain.  Her lisinopril was increased and her meds were reestablished.  Today she returns and reports again no chest pain.  Her blood pressure is better at 118/92.  Diastolic is still elevated.  She is not taking her blood pressures regularly.  She recently became unemployed and does not have any health insurance at this time.  She is followed at the [facility] health internal medicine clinic and has an appointment on May 14.  PMHx:  Past Medical History:  Diagnosis Date   Abdominal pain, RLQ 06/23/2014   AKI (acute kidney injury) (HCC) 01/14/2015   Anemia    Anxiety    Chest pain    Depression    Elevated LFTs 04/26/2012   Hypertension    Hypertensive urgency 06/13/2018   Migraine    Palpitations     Past Surgical History:  Procedure Laterality Date   HYSTEROSCOPY W/ ENDOMETRIAL ABLATION  2010   LAPAROSCOPY N/A 11/25/2014   Procedure: LAPAROSCOPY OPERATIVE WITH LEFT OVARIAN CYSTECTOMY;  Surgeon: Tereso Newcomer, MD;  Location: [facility];  Service: Gynecology;  Laterality: N/A;   TUBAL LIGATION  2006    FAMHx:  Family History  Problem Relation Age of Onset   Diabetes Mother    Depression Mother    Hypertension Mother    Hypertension Father    Hypertension Brother    Hypertension Brother    Breast cancer Neg Hx    Colon cancer Neg Hx    Stomach cancer Neg  Hx    Esophageal cancer Neg Hx     SOCHx:   reports that she has been smoking cigarettes. She started smoking about 28 years ago. She has a 6.50 pack-year smoking history. She has never used smokeless tobacco. She reports current alcohol use. She reports that she does not use drugs.  ALLERGIES:  No Known Allergies   ROS: Pertinent items noted in HPI and remainder of comprehensive ROS otherwise negative.  HOME MEDS: Current Outpatient Medications on File Prior to Visit  Medication Sig Dispense Refill   acetaminophen (TYLENOL) 500 MG tablet Take 1,000 mg by mouth daily as needed for headache (pain).      atorvastatin (LIPITOR) 80 MG tablet Take 1 tablet (80 mg total) by mouth daily. 90 tablet 1   hydrOXYzine (ATARAX) 25 MG tablet Take 1 tablet (25 mg total) by mouth every 6 (six) hours as needed for anxiety. 30 tablet 2   lisinopril (ZESTRIL) 30 MG tablet Take 1 tablet (30 mg total) by mouth daily. 30 tablet 5   metoprolol tartrate (LOPRESSOR) 50 MG tablet Take 1 tablet (50 mg total) by mouth 2 (two) times daily. 120 tablet 5   pantoprazole (PROTONIX) 40 MG tablet Take 1 tablet (40 mg total) by mouth daily. 30 tablet 1   sertraline (ZOLOFT) 25 MG tablet Take 1 tablet (25 mg total) by mouth daily. 30 tablet 2   triamterene-hydrochlorothiazide (MAXZIDE-25) 37.5-25 MG tablet Take 1 tablet by mouth daily. 90 tablet 3   EPINEPHrine 0.3 mg/0.3 mL IJ SOAJ injection Inject 0.3 mg into the muscle as needed for anaphylaxis. (Patient not taking: Reported on 05/07/2022) 1 each 0   [DISCONTINUED] citalopram (CELEXA) 10 MG tablet Take 1 tablet (10 mg total) by mouth daily. 30 tablet 2   No current facility-administered medications on file prior to visit.    LABS/IMAGING: No results found for this or any previous visit (from the past 48 hour(s)). No results found.  LIPID PANEL:    Component Value Date/Time   CHOL 156 07/17/2021 1516   TRIG 297 (H) 07/17/2021 1516   HDL 45 07/17/2021 1516   CHOLHDL 3.5 07/17/2021 1516   CHOLHDL 6.7 06/13/2018 0018   VLDL 58 (H) 06/13/2018 0018   LDLCALC 64 07/17/2021 1516    WEIGHTS: Wt Readings from Last 3 Encounters:  05/07/22 176 lb 3.2 oz (79.9 kg)  04/15/22 176 lb 3.2 oz (79.9 kg)  04/01/22 180 lb (81.6 kg)    VITALS: BP (!) 118/92 (BP Location: Left Arm, Patient Position: Sitting, Cuff Size: Normal)   Pulse 89   Ht 5\' 8"  (1.727 m)   Wt 176 lb 3.2 oz (79.9 kg)   BMI 26.79 kg/m   EXAM: General appearance: alert and no distress Neck: no carotid bruit, no JVD and thyroid not enlarged, symmetric, no tenderness/mass/nodules Lungs: clear to auscultation  bilaterally Heart: regular rate and rhythm, S1, S2 normal, no murmur, click, rub or gallop Abdomen: soft, non-tender; bowel sounds normal; no masses,  no organomegaly Extremities: extremities normal, atraumatic, no cyanosis or edema Pulses: 2+ and symmetric Skin: Skin color, texture, turgor normal. No rashes or lesions Neurologic: Grossly normal Psych: Pleasant  EKG: Deferred  ASSESSMENT: Chest pain, possibly secondary to uncontrolled hypertension Uncontrolled hypertension Dyslipidemia  PLAN: 1.   Ms. Welden had chest pain recently possibly due to secondary poorly controlled hypertension.  She was out of some medications and they were reestablished and her lisinopril was increased.  Her blood pressure is better today although her diastolic  pressure remains elevated.  She did report family history of early onset heart disease in her father who had heart surgery in his 15s.  Since her symptoms have resolved and she has no insurance at this time I will hold off on additional testing however if she has any recurrent chest pain symptoms, would have low threshold for coronary evaluation probably coronary CT angiogram.  I advised her to monitor blood pressures at home and we may need to consider adding additional agents if her blood pressure remains elevated.  I will provide a refill of her atorvastatin today and she should have lipids reassessed with her upcoming primary care appointment in 2 weeks.  Follow-up with [place] in about 3 to 4 months.  Chrystie Nose, MD, [facility], FACP  [place]  [facility]  Medical Director of the Advanced Lipid Disorders &  Cardiovascular Risk Reduction Clinic Diplomate of the American Board of Clinical Lipidology Attending Cardiologist  Direct Dial: [phone]  Fax: [phone]  Website:  [email or web address] Alexxus Sobh 05/07/2022, 9:01 AM

## 2022-05-14 ENCOUNTER — Ambulatory Visit (INDEPENDENT_AMBULATORY_CARE_PROVIDER_SITE_OTHER): Payer: Self-pay | Admitting: Licensed Clinical Social Worker

## 2022-05-14 DIAGNOSIS — F321 Major depressive disorder, single episode, moderate: Secondary | ICD-10-CM

## 2022-05-14 NOTE — BH Specialist Note (Signed)
Integrated Behavioral Health Follow Up In-Person Visit  MRN: [id number] Name: Katelyn [name]  Number of Integrated Behavioral Health Clinician visits: 2- Second Visit  Session Start time: 1430   Session End time: 1530  Total time in minutes: 60   Types of Service: Individual psychotherapy and General Behavioral Integrated Care (BHI)  Interpretor:No. Interpretor Name and Language: N/A  Subjective: Katelyn [name] is a 47 y.o. female  Patient was referred by Steffanie Rainwater, MD for Depression/ Anxiety. Patient reports the following symptoms/concerns: Sadness due to life factors Duration of problem: more than a year; Severity of problem: moderate  Objective: Mood: NA and Affect: Appropriate Risk of harm to self or others: No plan to harm self or others    Goals Addressed: Patient will:  Reduce symptoms of: anxiety and depression    Progress towards Goals: Ongoing  Interventions: Interventions utilized:  Motivational Interviewing, Solution-Focused Strategies, Mindfulness or Management consultant, Supportive Counseling, and Link to [employer] Standardized Assessments completed: PHQ-SADS     04/15/2022   11:20 AM 04/15/2022   11:18 AM 04/01/2022    2:31 PM  PHQ-SADS Last 3 Score only  Total GAD-7 Score  19 14  PHQ Adolescent Score 12        Assessment: Patient will;   contact the [employer] [facility]: ( Patient aspires to get her GED)               [facility] -- [phone] Ext. [phone]  Apply for unemployment. ( Patient recently loss her job of four years) Look into daughter's previous therapist. ( Duaghters father passed away Apr 06, 2021) Look into [place]- ( Patient is uninsured, but may qualify for Medicaid) Contact [employer] (kellinfoundation.org) for support groups. ( Patient wants to stop smoking. [facility] also sent PCP a inbasket explaining patients request for medication).     Patient may  benefit from ongoing therapy.  Plan: Follow up with behavioral health clinician on : 06/03 at 1:30 Pm [name], MSW, LCSW-A She/Her Behavioral Health Clinician [facility]  Internal Medicine Center Direct Dial:[phone]  Fax [phone] Main Office Phone: [phone] [address] [place] [place], [place] [address] Website: [facility] Internal Medicine [facility]  [place], [place]  [place]

## 2022-05-20 ENCOUNTER — Encounter: Payer: Commercial Managed Care - HMO | Admitting: Student

## 2022-06-02 ENCOUNTER — Encounter: Payer: Self-pay | Admitting: *Deleted

## 2022-06-09 ENCOUNTER — Encounter: Payer: Self-pay | Admitting: Licensed Clinical Social Worker

## 2022-06-09 ENCOUNTER — Ambulatory Visit (INDEPENDENT_AMBULATORY_CARE_PROVIDER_SITE_OTHER): Payer: Medicaid Other | Admitting: Licensed Clinical Social Worker

## 2022-06-09 DIAGNOSIS — F321 Major depressive disorder, single episode, moderate: Secondary | ICD-10-CM

## 2022-06-09 NOTE — Progress Notes (Signed)
Patient no-showed today's appointment; appointment was for in-person visit at 1:30Pm  Behavioral Health Clinician [facility] from here on out)  attempted patient  via Telephone.  [facility] contacted patient from telephone number [phone]. [facility] left a  VM.   Patient will need to reschedule appointment by calling Internal medicine center [phone].  [name], MSW, LCSW-A She/Her Behavioral Health Clinician [facility]  Internal Medicine Center Direct [phone]  Fax [phone] Main Office Phone: [phone] [address] [place] [place], [place] [address] Website: [facility] Internal Medicine [facility]  [place], [place]  [place]

## 2022-06-09 NOTE — BH Specialist Note (Signed)
Integrated Behavioral Health via Telemedicine Visit  06/09/2022 Katelyn [name] [id number]  Number of Integrated Behavioral Health Clinician visits: Additional Visit  Session Start time: 1430   Session End time: 1450  Total time in minutes: 20    I connected with Katelyn [name] via  Telephone or Video Enabled Telemedicine Application  (Video is Caregility application) and verified that I am speaking with the correct person using two identifiers. Discussed confidentiality: Yes   I discussed the limitations of telemedicine and the availability of in person appointments.  Discussed there is a possibility of technology failure and discussed alternative modes of communication if that failure occurs.  I discussed that engaging in this telemedicine visit, they consent to the provision of behavioral healthcare and the services will be billed under their insurance.  Patient and/or legal guardian expressed understanding and consented to Telemedicine visit: Yes      Assessment: Patient was unable to talk during visit. Patient requested appointment to be rescheduled.     Plan: Follow up with behavioral health clinician on : June 17th at 2:30pm  I discussed the assessment and treatment plan with the patient and/or parent/guardian. They were provided an opportunity to ask questions and all were answered. They agreed with the plan and demonstrated an understanding of the instructions.   They were advised to call back or seek an in-person evaluation if the symptoms worsen or if the condition fails to improve as anticipated.  [name], MSW, LCSW-A She/Her Behavioral Health Clinician [facility]  Internal Medicine Center Direct Dial:[phone]  Fax [phone] Main Office Phone: [phone] [address] [place] [place], [place] [address] Website: [facility] Internal Medicine [facility]  [place], [place]  [place]

## 2022-06-23 ENCOUNTER — Ambulatory Visit: Payer: Medicaid Other | Admitting: Licensed Clinical Social Worker

## 2022-06-23 DIAGNOSIS — F32A Depression, unspecified: Secondary | ICD-10-CM | POA: Diagnosis not present

## 2022-06-23 DIAGNOSIS — F419 Anxiety disorder, unspecified: Secondary | ICD-10-CM

## 2022-06-23 NOTE — BH Specialist Note (Signed)
Integrated Behavioral Health Follow Up In-Person Visit  MRN: [id number] Name: Katelyn [name]  Number of Integrated Behavioral Health Clinician visits: Additional Visit  Session Start time: 1430   Session End time: 1500  Total time in minutes: 30   Types of Service: Individual psychotherapy and General Behavioral Integrated Care (BHI)   Interpretor:No. Interpretor Name and Language: N/A   Subjective: Katelyn [name] is a 47 y.o. female  Patient was referred by Steffanie Rainwater, MD for Depression/ Anxiety. Patient reports the following symptoms/concerns: Sadness due to life factors Duration of problem: more than a year; Severity of problem: moderate   Objective: Mood: NA and Affect: Appropriate Risk of harm to self or others: No plan to harm self or others       Goals Addressed: Patient will:  Reduce symptoms of: anxiety and depression      Progress towards Goals: Ongoing   Interventions: Interventions utilized:  Motivational Interviewing, Solution-Focused Strategies, Mindfulness or Management consultant, Supportive Counseling, and Link to [employer] Standardized Assessments completed: PHQ-SADS       04/15/2022   11:20 AM 04/15/2022   11:18 AM 04/01/2022    2:31 PM  PHQ-SADS Last 3 Score only  Total GAD-7 Score   19 14  PHQ Adolescent Score 12            Assessment: Patient will;   Patient is still working on prepping for her GED exams. contact the [facility] Education [facility]: ( Patient aspires to get her GED)                [facility] -- [phone] Ext. [phone]    Contact [facility] ([employer]ell[employer]oundation.org) for support groups. ( Patient wants to stop smoking. [facility] also sent PCP a inbasket explaining patients request for medication).         Patient may benefit from ongoing therapy.   Plan: Follow up with behavioral health clinician on : within the next 30 days. [name], MSW,  LCSW-A She/Her Behavioral Health Clinician [facility]  Internal Medicine Center Direct [phone]  Fax [phone] Main Office Phone: [phone] [address] [place] [place], [place] [address] Website: [facility] Internal Medicine [facility]  [place], [place]  [place]

## 2022-07-07 ENCOUNTER — Encounter: Payer: Medicaid Other | Admitting: Internal Medicine

## 2022-07-21 ENCOUNTER — Ambulatory Visit: Payer: Medicaid Other | Admitting: Internal Medicine

## 2022-07-21 ENCOUNTER — Ambulatory Visit: Payer: Medicaid Other | Admitting: Licensed Clinical Social Worker

## 2022-07-21 ENCOUNTER — Encounter: Payer: Self-pay | Admitting: Internal Medicine

## 2022-07-21 VITALS — BP 157/118 | HR 75 | Ht 68.0 in | Wt 183.8 lb

## 2022-07-21 DIAGNOSIS — F32A Depression, unspecified: Secondary | ICD-10-CM

## 2022-07-21 DIAGNOSIS — A048 Other specified bacterial intestinal infections: Secondary | ICD-10-CM

## 2022-07-21 DIAGNOSIS — I1A0 Resistant hypertension: Secondary | ICD-10-CM

## 2022-07-21 DIAGNOSIS — K219 Gastro-esophageal reflux disease without esophagitis: Secondary | ICD-10-CM

## 2022-07-21 DIAGNOSIS — R1013 Epigastric pain: Secondary | ICD-10-CM

## 2022-07-21 DIAGNOSIS — Z1211 Encounter for screening for malignant neoplasm of colon: Secondary | ICD-10-CM

## 2022-07-21 DIAGNOSIS — R109 Unspecified abdominal pain: Secondary | ICD-10-CM

## 2022-07-21 DIAGNOSIS — F321 Major depressive disorder, single episode, moderate: Secondary | ICD-10-CM

## 2022-07-21 MED ORDER — METOPROLOL SUCCINATE ER 50 MG PO TB24: 50.0000 mg | ORAL_TABLET | Freq: Every day | ORAL | 11 refills | Status: AC

## 2022-07-21 MED ORDER — PANTOPRAZOLE SODIUM 40 MG PO TBEC: 40.0000 mg | DELAYED_RELEASE_TABLET | Freq: Every day | ORAL | 1 refills | Status: DC

## 2022-07-21 MED ORDER — SERTRALINE HCL 50 MG PO TABS: 50.0000 mg | ORAL_TABLET | Freq: Every day | ORAL | 2 refills | Status: DC

## 2022-07-21 MED ORDER — AMLODIPINE-OLMESARTAN 10-20 MG PO TABS: 1.0000 | ORAL_TABLET | Freq: Every day | ORAL | 11 refills | Status: AC

## 2022-07-21 NOTE — Assessment & Plan Note (Signed)
Screening colonoscopy referral ordered

## 2022-07-21 NOTE — Assessment & Plan Note (Deleted)
She complains of intermittent epigastric pain, and a "burning feeling, almost hard to describe." This occurs after meals, and especially after spicy foods.It does not occur after eating anything specific but is with any food and she avoids dairy. She takes peopto bismol which has only slightly helped. She denies chest pain, SOB, and n/v but endorses non bloody diarrhea occurring ~2x a week. She has been stressed with personal life at home (mother had ovarian cancer, husband passed away not too long ago), and maybe drinks once or twice a week.She has a history of H.Pylori infection which was resolved on her last urea breath test which was negative and asked for a GI referral today. DDX includes GERD vs H.Pylori infection vs lactose intolerance vs IBS. GERD is on the differential because she has acid reflux, she smokes and has been under a lot of stress lately. Since she has a history of H. Pylori infection it is possible she could have it again although her last urea test was negative. Lactose intolerance is less likely because she avoids dairy. IBS could be on the differential since her diarrhea seems more intermittent.  - Stop taking peptobismol or protonix for 2 weeks prior to urea breath test.  - Make a log of foods she is eating on days she has diarrhea.

## 2022-07-21 NOTE — Assessment & Plan Note (Signed)
Patient reports she is still "stressed and depressed" due to her mother having ovarian cancer, and having to pay all her bills while being unemployed. She had a PHQ-9 score or 17 at today's visit. Zoloft 25 mg was not helping her and she was taking Atarax as needed especially for her sleep. She has been seeing Ms. Bianca (behavior specialist) at the clinic which has been helping her. She denied any suicidal ideation.  - Increased dose of Zoloft/sertraline to 50 mg  - Continue seeing Ms. Marena Chancy

## 2022-07-21 NOTE — Assessment & Plan Note (Addendum)
She complains of intermittent epigastric pain, and a "burning feeling, almost hard to describe." This occurs after meals, and especially after spicy foods.It does not occur after eating anything specific but is with any food and she avoids dairy. She takes peopto bismol which has only slightly helped. She denies chest pain, SOB, and n/v but endorses non bloody diarrhea occurring ~2x a week. She has been stressed with personal life at home (mother had ovarian cancer, husband passed away not too long ago), and maybe drinks once or twice a week.She has a history of H.Pylori infection which was resolved on her last urea breath test which was negative and asked for a GI referral today. DDX includes GERD vs H.Pylori infection vs lactose intolerance vs IBS. GERD is on the differential because she has acid reflux, she smokes and has been under a lot of stress lately. Since she has a history of H. Pylori infection it is possible she could have it again although her last urea test was negative following treatment 7/23. Lactose intolerance is less likely because she avoids dairy. IBS could be on the differential since her diarrhea seems more intermittent.  - Stop taking peptobismol or protonix for 2 weeks prior to urea breath test.  - Make a log of foods she is eating on days she has diarrhea.

## 2022-07-21 NOTE — Progress Notes (Signed)
Subjective:  CC: blood pressure  HPI:  [name] is a 47 y.o. female with a past medical history stated below and presents today for follow-up on elevated blood pressure. She continued to have abdominal pain that seems like prior episodes of acid reflux. She has been taking pepto-bismol without improvement in symptoms.   Please see problem based assessment and plan for additional details.  Past Medical History:  Diagnosis Date   Abdominal pain, RLQ 06/23/2014   Anemia    Anxiety    Chest pain    Depression    Elevated LFTs 04/26/2012   Hypertension    Migraine    Palpitations     Current Outpatient Medications on File Prior to Visit  Medication Sig Dispense Refill   acetaminophen (TYLENOL) 500 MG tablet Take 1,000 mg by mouth daily as needed for headache (pain).     atorvastatin (LIPITOR) 80 MG tablet Take 1 tablet (80 mg total) by mouth daily. 90 tablet 3   EPINEPHrine 0.3 mg/0.3 mL IJ SOAJ injection Inject 0.3 mg into the muscle as needed for anaphylaxis. (Patient not taking: Reported on 05/07/2022) 1 each 0   hydrOXYzine (ATARAX) 25 MG tablet Take 1 tablet (25 mg total) by mouth every 6 (six) hours as needed for anxiety. 30 tablet 2   triamterene-hydrochlorothiazide (MAXZIDE-25) 37.5-25 MG tablet Take 1 tablet by mouth daily. 90 tablet 3   [DISCONTINUED] citalopram (CELEXA) 10 MG tablet Take 1 tablet (10 mg total) by mouth daily. 30 tablet 2   No current facility-administered medications on file prior to visit.    Family History  Problem Relation Age of Onset   Diabetes Mother    Depression Mother    Hypertension Mother    Hypertension Father    Hypertension Brother    Hypertension Brother    Breast cancer Neg Hx    Colon cancer Neg Hx    Stomach cancer Neg Hx    Esophageal cancer Neg Hx     Social History   Socioeconomic History   Marital status: Married    Spouse name: Not on file   Number of children: 3   Years of education: Not on file    Highest education level: 11th grade  Occupational History   Occupation: Ambulance person  Tobacco Use   Smoking status: Every Day    Current packs/day: 0.00    Average packs/day: 0.5 packs/day for 24.4 years (12.2 ttl pk-yrs)    Types: Cigarettes    Start date: 01/06/1994    Last attempt to quit: 06/12/2018    Years since quitting: 4.1   Smokeless tobacco: Never   Tobacco comments:    1 pack per week  Vaping Use   Vaping status: Never Used  Substance and Sexual Activity   Alcohol use: Yes    Alcohol/week: 0.0 standard drinks of alcohol    Comment: occassionally   Drug use: No   Sexual activity: Yes    Birth control/protection: Surgical  Other Topics Concern   Not on file  Social History Narrative   Not on file   Social Determinants of Health   Financial Resource Strain: Not on file  Food Insecurity: No Food Insecurity (08/07/2020)   Hunger Vital Sign    Worried About Running Out of Food in the Last Year: Never true    Ran Out of Food in the Last Year: Never true  Transportation Needs: No Transportation Needs (08/07/2020)   PRAPARE - Transportation    Lack  of Transportation (Medical): No    Lack of Transportation (Non-Medical): No  Physical Activity: Not on file  Stress: Not on file  Social Connections: Not on file  Intimate Partner Violence: Not on file    Review of Systems: ROS negative except for what is noted on the assessment and plan.  Objective:   Vitals:   07/21/22 1003 07/21/22 1014  BP: (!) 156/113 (!) 157/118  Pulse: 79 75  SpO2: 100%   Weight: 183 lb 12.8 oz (83.4 kg)   Height: 5\' 8"  (1.727 m)     Physical Exam: Constitutional: well-appearing  Cardiovascular: regular rate and rhythm, no m/r/g Pulmonary/Chest: normal work of breathing on room air, lungs clear to auscultation bilaterally Abdominal: soft, non-tender, non-distended MSK: normal bulk and tone Skin: warm and dry  Assessment & Plan:  Resistant hypertension [name] has a long  standing history of hypertension. Today her bp readings were 156/113 and 157/118. She reports she adheres to her medication, although on her chart it showed she had not refilled her medication. She said she missed one day this week but otherwise she takes her medications every morning. She had a headache couple of days ago but denied any SOB, chest pain, palpitations, chills, or n/v. We explored secondary causes of her htn, however she had normal renin, aldosterone levels in the past.   She was only taking metoprolol once a day instead of twice a day. We made some medication changes as followed to help increase adherence.  - Stop Lisinopril  - Start amlodipine/olmesartan ( 10-20 mg) - Start metoprolol succinate 50 mg - Continue triamterene/hydrochlorothiazide  -f/u in 2 weeks   Depression Patient reports she is still "stressed and depressed" due to her mother having ovarian cancer, and having to pay all her bills while being unemployed. She had a PHQ-9 score or 17 at today's visit. Zoloft 25 mg was not helping her and she was taking Atarax as needed especially for her sleep. She has been seeing Ms. Bianca (behavior specialist) at the clinic which has been helping her. She denied any suicidal ideation.  - Increased dose of Zoloft/sertraline to 50 mg  - Continue seeing Ms. Bianca   Abdominal pain She complains of intermittent epigastric pain, and a "burning feeling, almost hard to describe." This occurs after meals, and especially after spicy foods.It does not occur after eating anything specific but is with any food and she avoids dairy. She takes peopto bismol which has only slightly helped. She denies chest pain, SOB, and n/v but endorses non bloody diarrhea occurring ~2x a week. She has been stressed with personal life at home (mother had ovarian cancer, husband passed away not too long ago), and maybe drinks once or twice a week.She has a history of H.Pylori infection which was resolved on her  last urea breath test which was negative and asked for a GI referral today. DDX includes GERD vs H.Pylori infection vs lactose intolerance vs IBS. GERD is on the differential because she has acid reflux, she smokes and has been under a lot of stress lately. Since she has a history of H. Pylori infection it is possible she could have it again although her last urea test was negative following treatment 7/23. Lactose intolerance is less likely because she avoids dairy. IBS could be on the differential since her diarrhea seems more intermittent.  - Stop taking peptobismol or protonix for 2 weeks prior to urea breath test.  - Make a log of foods she is  eating on days she has diarrhea.   Encounter for screening colonoscopy Screening colonoscopy referral ordered    Patient discussed with Dr. Cleda Daub   Miyana Mordecai, D.O. [facility] Internal Medicine  PGY-3 Pager: [phone]  Phone: [phone] Date 07/21/2022  Time 5:19 PM

## 2022-07-21 NOTE — Patient Instructions (Addendum)
Thank you, Ms.Mohammed Kindle Kleen for allowing [place] to provide your care today. Today we discussed the following with you today.  Hypertension - We want to change your blood pressure medication around so it is easier to keep up with them. Start combo pill of amlodipine/olmesartan Continue triamterenee-hydrochlorothiazide Pickup metoprolol extended release--- take once daily Stop lisinopril   Stomach pain - Please get the urea breath test so we can rule out that you are not having another H.Pylori infection. We have referred you to the GI doctor. Follow-up in 2 weeks to get testing. Be sure you are not taking peptobismol or protonix for 2 weeks prior to test. Diarrhea -  Please make a log of food on days you're having diarrhea.  Mood-  We are increasing your dosage of Zoloft to 50 mg. Continue follow up with Ms. Marena Chancy.    Referrals ordered today:   Referral Orders         Ambulatory referral to Gastroenterology       I have ordered the following medication/changed the following medications:   Stop the following medications: Medications Discontinued During This Encounter  Medication Reason   lisinopril (ZESTRIL) 30 MG tablet    metoprolol tartrate (LOPRESSOR) 50 MG tablet    sertraline (ZOLOFT) 25 MG tablet    pantoprazole (PROTONIX) 40 MG tablet Reorder   pantoprazole (PROTONIX) 40 MG tablet      Start the following medications: Meds ordered this encounter  Medications   amlodipine-olmesartan (AZOR) 10-20 MG tablet    Sig: Take 1 tablet by mouth daily.    Dispense:  30 tablet    Refill:  11   metoprolol succinate (TOPROL XL) 50 MG 24 hr tablet    Sig: Take 1 tablet (50 mg total) by mouth daily. Take with or immediately following a meal.    Dispense:  30 tablet    Refill:  11   sertraline (ZOLOFT) 50 MG tablet    Sig: Take 1 tablet (50 mg total) by mouth daily.    Dispense:  30 tablet    Refill:  2   DISCONTD: pantoprazole (PROTONIX) 40 MG tablet    Sig: Take 1 tablet (40  mg total) by mouth daily.    Dispense:  30 tablet    Refill:  1     Follow up:  1 month     Remember: To check your blood pressure at home daily around the same time.   We look forward to seeing you next time. Please call our clinic at [phone] if you have any questions or concerns. The best time to call is Monday-Friday from 9am-4pm, but there is someone available 24/7. If after hours or the weekend, call the main hospital number and ask for the Internal Medicine Resident On-Call. If you need medication refills, please notify your pharmacy one week in advance and they will send [place] a request.   Thank you for trusting me with your care. Wishing you the best!   Rudene Christians, DO [facility]

## 2022-07-21 NOTE — Assessment & Plan Note (Addendum)
Katelyn [name] has a long standing history of hypertension. Today her bp readings were 156/113 and 157/118. She reports she adheres to her medication, although on her chart it showed she had not refilled her medication. She said she missed one day this week but otherwise she takes her medications every morning. She had a headache couple of days ago but denied any SOB, chest pain, palpitations, chills, or n/v. We explored secondary causes of her htn, however she had normal renin, aldosterone levels in the past.   She was only taking metoprolol once a day instead of twice a day. We made some medication changes as followed to help increase adherence.  - Stop Lisinopril  - Start amlodipine/olmesartan ( 10-20 mg) - Start metoprolol succinate 50 mg - Continue triamterene/hydrochlorothiazide  -f/u in 2 weeks

## 2022-07-22 NOTE — Progress Notes (Signed)
 Internal Medicine Clinic Attending  Case discussed with the resident at the time of the visit.  We reviewed the resident's history and exam and pertinent patient test results.  I agree with the assessment, diagnosis, and plan of care documented in the resident's note.  

## 2022-07-28 ENCOUNTER — Ambulatory Visit (INDEPENDENT_AMBULATORY_CARE_PROVIDER_SITE_OTHER): Payer: Medicaid Other | Admitting: Licensed Clinical Social Worker

## 2022-07-28 DIAGNOSIS — F419 Anxiety disorder, unspecified: Secondary | ICD-10-CM | POA: Diagnosis not present

## 2022-07-28 DIAGNOSIS — F32A Depression, unspecified: Secondary | ICD-10-CM

## 2022-07-28 NOTE — BH Specialist Note (Signed)
Integrated Behavioral Health via Telemedicine Visit  07/28/2022 Angline Schweigert [id number]  Number of Integrated Behavioral Health Clinician visits: Additional Visit  Session Start time: 1430   Session End time: 1500  Total time in minutes: 30   I connected with Katelyn [name] via  Telephone or Video Enabled Telemedicine Application  (Video is Caregility application) and verified that I am speaking with the correct person using two identifiers. Discussed confidentiality: Yes    I discussed the limitations of telemedicine and the availability of in person appointments.  Discussed there is a possibility of technology failure and discussed alternative modes of communication if that failure occurs.   I discussed that engaging in this telemedicine visit, they consent to the provision of behavioral healthcare and the services will be billed under their insurance.   Patient and/or legal guardian expressed understanding and consented to Telemedicine visit: Yes          Assessment: Patient was not feeling well on today. Patient discussed upcoming colonoscopy , enrolling in [employer] and possibly starting employment in September. Patient was suffering from a headache and requested to speak with [facility] on another day. [facility] scheduled patient for 08/05 at 1:30pm in person.        Plan: Follow up with behavioral health clinician on : August 5 at 1:30 Pm in person   I discussed the assessment and treatment plan with the patient and/or parent/guardian. They were provided an opportunity to ask questions and all were answered. They agreed with the plan and demonstrated an understanding of the instructions.   They were advised to call back or seek an in-person evaluation if the symptoms worsen or if the condition fails to improve as anticipated.   [name], MSW, LCSW-A She/Her Behavioral Health Clinician [facility]  Internal Medicine Center Direct [phone]  Fax  [phone] Main Office Phone: [phone] [address] [place] [place], [place] [address] Website: [facility] Internal Medicine [facility]  [place], [place]  [place]

## 2022-08-04 ENCOUNTER — Other Ambulatory Visit: Payer: Self-pay | Admitting: *Deleted

## 2022-08-04 ENCOUNTER — Ambulatory Visit (INDEPENDENT_AMBULATORY_CARE_PROVIDER_SITE_OTHER): Payer: Medicaid Other | Admitting: Internal Medicine

## 2022-08-04 ENCOUNTER — Encounter: Payer: Self-pay | Admitting: Internal Medicine

## 2022-08-04 ENCOUNTER — Other Ambulatory Visit: Payer: Self-pay | Admitting: Internal Medicine

## 2022-08-04 VITALS — BP 108/83 | HR 74 | Temp 98.0°F | Ht 68.0 in | Wt 183.2 lb

## 2022-08-04 DIAGNOSIS — K219 Gastro-esophageal reflux disease without esophagitis: Secondary | ICD-10-CM

## 2022-08-04 DIAGNOSIS — R1013 Epigastric pain: Secondary | ICD-10-CM

## 2022-08-04 DIAGNOSIS — R109 Unspecified abdominal pain: Secondary | ICD-10-CM | POA: Diagnosis present

## 2022-08-04 DIAGNOSIS — K30 Functional dyspepsia: Secondary | ICD-10-CM

## 2022-08-04 MED ORDER — PANTOPRAZOLE SODIUM 40 MG PO TBEC: 40.0000 mg | DELAYED_RELEASE_TABLET | Freq: Every day | ORAL | 1 refills | Status: DC

## 2022-08-04 NOTE — Progress Notes (Signed)
CC: abdominal pain  HPI:  Katelyn [name] is a 47 y.o. female living with a history stated below and presents today for a 2 week follow up of her abdominal pain/indigestion. Please see problem based assessment and plan for additional details.  Past Medical History:  Diagnosis Date   Abdominal pain, RLQ 06/23/2014   Anemia    Anxiety    Chest pain    Depression    Elevated LFTs 04/26/2012   Hypertension    Migraine    Palpitations     Current Outpatient Medications on File Prior to Visit  Medication Sig Dispense Refill   acetaminophen (TYLENOL) 500 MG tablet Take 1,000 mg by mouth daily as needed for headache (pain).     amlodipine-olmesartan (AZOR) 10-20 MG tablet Take 1 tablet by mouth daily. 30 tablet 11   atorvastatin (LIPITOR) 80 MG tablet Take 1 tablet (80 mg total) by mouth daily. 90 tablet 3   EPINEPHrine 0.3 mg/0.3 mL IJ SOAJ injection Inject 0.3 mg into the muscle as needed for anaphylaxis. (Patient not taking: Reported on 05/07/2022) 1 each 0   hydrOXYzine (ATARAX) 25 MG tablet Take 1 tablet (25 mg total) by mouth every 6 (six) hours as needed for anxiety. 30 tablet 2   metoprolol succinate (TOPROL XL) 50 MG 24 hr tablet Take 1 tablet (50 mg total) by mouth daily. Take with or immediately following a meal. 30 tablet 11   sertraline (ZOLOFT) 50 MG tablet Take 1 tablet (50 mg total) by mouth daily. 30 tablet 2   triamterene-hydrochlorothiazide (MAXZIDE-25) 37.5-25 MG tablet Take 1 tablet by mouth daily. 90 tablet 3   [DISCONTINUED] citalopram (CELEXA) 10 MG tablet Take 1 tablet (10 mg total) by mouth daily. 30 tablet 2   No current facility-administered medications on file prior to visit.    Family History  Problem Relation Age of Onset   Diabetes Mother    Depression Mother    Hypertension Mother    Hypertension Father    Hypertension Brother    Hypertension Brother    Breast cancer Neg Hx    Colon cancer Neg Hx    Stomach cancer Neg Hx    Esophageal  cancer Neg Hx     Social History   Socioeconomic History   Marital status: Married    Spouse name: Not on file   Number of children: 3   Years of education: Not on file   Highest education level: 11th grade  Occupational History   Occupation: Ambulance person  Tobacco Use   Smoking status: Every Day    Current packs/day: 0.00    Average packs/day: 0.5 packs/day for 24.4 years (12.2 ttl pk-yrs)    Types: Cigarettes    Start date: 01/06/1994    Last attempt to quit: 06/12/2018    Years since quitting: 4.1   Smokeless tobacco: Never   Tobacco comments:    1 pack per week  Vaping Use   Vaping status: Never Used  Substance and Sexual Activity   Alcohol use: Yes    Alcohol/week: 0.0 standard drinks of alcohol    Comment: occassionally   Drug use: No   Sexual activity: Yes    Birth control/protection: Surgical  Other Topics Concern   Not on file  Social History Narrative   Not on file   Social Determinants of Health   Financial Resource Strain: Not on file  Food Insecurity: No Food Insecurity (08/07/2020)   Hunger Vital Sign    Worried  About Running Out of Food in the Last Year: Never true    Ran Out of Food in the Last Year: Never true  Transportation Needs: No Transportation Needs (08/07/2020)   PRAPARE - Administrator, Civil Service (Medical): No    Lack of Transportation (Non-Medical): No  Physical Activity: Not on file  Stress: Not on file  Social Connections: Not on file  Intimate Partner Violence: Not on file    Review of Systems: ROS negative except for what is noted on the assessment and plan.  Vitals:   08/04/22 1437  BP: 108/83  Pulse: 74  Temp: 98 F (36.7 C)  TempSrc: Oral  SpO2: 100%  Weight: 183 lb 3.2 oz (83.1 kg)  Height: 5\' 8"  (1.727 m)    Physical Exam: Constitutional: well-appearing female sitting in chair, in no acute distress Cardiovascular: regular rate and rhythm, no m/r/g Pulmonary/Chest: normal work of breathing on room  air, lungs clear to auscultation bilaterally Abdominal: soft, non-tender, non-distended, normal bowel sounds Skin: warm and dry Psych: normal mood and behavior  Assessment & Plan:   Patient discussed with Dr. Antony Contras  Abdominal pain The patient presents for a 2 week follow up of indigestion/a burning sensation in her epigastric region. This feeling is worse after eating spicy foods and she has preemptively tried to avoid dairy products. Additionally, she notes that she has had 1-2 loose stools per day, but they are still formed (not diarrhea). IBS was discussed at the last office visit, and is certainly on the differential for this patient. Additionally, back in April 2023 she was diagnosed with H. Pylori and completed treatment for this (eradicated as of 07/2021). The patient had been taking peptobismol and protonix daily for her symptoms, so after her last visit on 7/15, she was advised to stop these medications so she can get re-tested for H. Pylori, as that could be the cause of her symptoms. Lastly, she has a history of elevated AST/ALT, so Celiac is also on the differential.   Plan: - H pylori breath test today - Restart protonix 40 mg daily after - Tissue transglutaminase ab to test for celiac - Follow up with GI (previously saw Dr. Myrtie Neither)   Elza Rafter, D.O. [facility] Internal Medicine, PGY-3 Phone: [phone] Date 08/04/2022 Time 4:40 PM

## 2022-08-04 NOTE — Assessment & Plan Note (Addendum)
The patient presents for a 2 week follow up of indigestion/a burning sensation in her epigastric region. This feeling is worse after eating spicy foods and she has preemptively tried to avoid dairy products. Additionally, she notes that she has had 1-2 loose stools per day, but they are still formed (not diarrhea). IBS was discussed at the last office visit, and is certainly on the differential for this patient. Additionally, back in April 2023 she was diagnosed with H. Pylori and completed treatment for this (eradicated as of 07/2021). The patient had been taking peptobismol and protonix daily for her symptoms, so after her last visit on 7/15, she was advised to stop these medications so she can get re-tested for H. Pylori, as that could be the cause of her symptoms. Lastly, she has a history of elevated AST/ALT, so Celiac is also on the differential.   Plan: - H pylori breath test today - Restart protonix 40 mg daily after - Tissue transglutaminase ab to test for celiac - Follow up with GI (previously saw Dr. Myrtie Neither)

## 2022-08-04 NOTE — Patient Instructions (Signed)
Thank you, Ms.Mohammed Kindle Chim for allowing [place] to provide your care today. Today we discussed:  Indigestion We are doing another H. Pylori test to see if you have this infection again  We are also doing a blood test to see if you have Celiac disease (gluten sensitivity) as the cause of your symptoms Please call Dr. Myrtie Neither (the gastroenterologist you have seen before) - [phone] to get an appointment for this and for your colonoscopy  I have ordered the following labs for you:   Lab Orders         Tissue Transglutaminase Abs,IgG,IgA      Referrals ordered today:   Referral Orders  No referral(s) requested today     I have ordered the following medication/changed the following medications:   Stop the following medications: There are no discontinued medications.   Start the following medications: Meds ordered this encounter  Medications   pantoprazole (PROTONIX) 40 MG tablet    Sig: Take 1 tablet (40 mg total) by mouth daily.    Dispense:  30 tablet    Refill:  1     Follow up:  1 month for indigestion  Should you have any questions or concerns please call the internal medicine clinic at [phone].     Elza Rafter, D.O. [facility] Center

## 2022-08-05 NOTE — Progress Notes (Signed)
Internal Medicine Clinic Attending  Case discussed with the resident at the time of the visit.  We reviewed the resident's history and exam and pertinent patient test results.  I agree with the assessment, diagnosis, and plan of care documented in the resident's note.  

## 2022-08-07 NOTE — Progress Notes (Signed)
Patient called about negative results. Scheduling follow up with GI.

## 2022-08-11 ENCOUNTER — Ambulatory Visit: Payer: Medicaid Other | Admitting: Licensed Clinical Social Worker

## 2022-08-13 ENCOUNTER — Encounter: Payer: Self-pay | Admitting: Physician Assistant

## 2022-08-13 ENCOUNTER — Ambulatory Visit: Payer: Medicaid Other | Attending: Physician Assistant | Admitting: Physician Assistant

## 2022-08-13 VITALS — BP 100/70 | HR 70 | Ht 68.0 in | Wt 183.6 lb

## 2022-08-13 DIAGNOSIS — I1A0 Resistant hypertension: Secondary | ICD-10-CM | POA: Diagnosis not present

## 2022-08-13 DIAGNOSIS — Z79899 Other long term (current) drug therapy: Secondary | ICD-10-CM | POA: Diagnosis not present

## 2022-08-13 NOTE — Patient Instructions (Signed)
Medication Instructions:  No changes *If you need a refill on your cardiac medications before your next appointment, please call your pharmacy*   Lab Work: BMP today. If you have labs (blood work) drawn today and your tests are completely normal, you will receive your results only by: MyChart Message (if you have MyChart) OR A paper copy in the mail If you have any lab test that is abnormal or we need to change your treatment, we will call you to review the results.   Testing/Procedures: No testing   Follow-Up: At [facility], you and your health needs are our priority.  As part of our continuing mission to provide you with exceptional heart care, we have created designated Provider Care Teams.  These Care Teams include your primary Cardiologist (physician) and Advanced Practice Providers (APPs -  Physician Assistants and Nurse Practitioners) who all work together to provide you with the care you need, when you need it.    Your next appointment:   6 month(s)  Provider:   Chrystie Nose, MD

## 2022-08-13 NOTE — Progress Notes (Signed)
Cardiology Office Note:  .   Date:  08/13/2022  ID:  Katelyn [name], DOB [birth year], MRN [id number] PCP: Morene Crocker, MD  [facility] Providers Cardiologist:  Chrystie Nose, MD     History of Present Illness: .   Katelyn [name] is a 47 y.o. female with past medical history of hypertension, hyperlipidemia, intermittent noncompliance, anxiety/depression and a history of palpitation.  She has a history of hypertension dating back to her first pregnancy in 1995 at which time she was diagnosed with preeclampsia.  She has strong family history of hypertension in her family including both twin brothers, her mother and the father are all on blood pressure medications.  Patient was last seen by Dr. Rennis Golden on 05/07/2018 for after presented to ED was chest pain.  Blood pressure was 218/145.  She was initially treated with clonidine and had run out of the number of her medications.  After her blood pressure improved, she had no further chest discomfort.  Lisinopril was increased.  On follow-up, her blood pressure was 118/92.  Patient presents today for follow-up.  She denies any chest discomfort or shortness of breath.  She has been compliant with her blood pressure medications.  Blood pressure today is 100/67, repeat blood pressure by myself was 100/70 in the left arm.  She has no problem holding the current medication.  All of her blood pressure medications have enough refills to last 1 year.  I recommended continue on the current therapy.  I will obtain basic metabolic panel since multiple blood pressure medication can potentially shift the kidney function and the electrolyte.  Otherwise, she can follow-up in 49-month.  ROS:   She denies chest pain, palpitations, dyspnea, pnd, orthopnea, n, v, dizziness, syncope, edema, weight gain, or early satiety. All other systems reviewed and are otherwise negative except as noted above.    Studies Reviewed: .        Cardiac Studies &  Procedures       ECHOCARDIOGRAM  ECHOCARDIOGRAM COMPLETE 06/18/2016  Narrative *[place]* *[facility]* [address] [place], [place] [address] [phone]  ------------------------------------------------------------------- Transthoracic Echocardiography  Patient:    Katelyn [name] MR #:       [id number] Study Date: 06/18/2016 Gender:     F Age:        41 Height:     175.3 cm Weight:     84.4 kg BSA:        2.04 m^2 Pt. Status: Room:       2W35C  ADMITTING    Uvaldo Rising ATTENDING    Burna Cash REFERRING    Uvaldo Rising PERFORMING   [facility], Inpatient REFERRING    Diallo, Abdoulaye SONOGRAPHER  Sinda Du, RDCS  cc:  ------------------------------------------------------------------- LV EF: 50% -   55%  ------------------------------------------------------------------- Indications:      Murmur 785.2.  ------------------------------------------------------------------- Study Conclusions  - Left ventricle: The cavity size was normal. There was severe concentric hypertrophy. Systolic function was normal. The estimated ejection fraction was in the range of 50% to 55%. Wall motion was normal; there were no regional wall motion abnormalities. Doppler parameters are consistent with abnormal left ventricular relaxation (grade 1 diastolic dysfunction). Doppler parameters are consistent with high ventricular filling pressure. - Aortic valve: Transvalvular velocity was within the normal range. There was no stenosis. There was no regurgitation. - Mitral valve: Transvalvular velocity was within the normal range. There was  no evidence for stenosis. There was trivial regurgitation. - Right ventricle: The cavity size was normal. Wall thickness was normal. Systolic function was normal. - Tricuspid valve: There was no regurgitation.  ------------------------------------------------------------------- Study  data:  No prior study was available for comparison.  Study status:  Routine.  Procedure:  Transthoracic echocardiography. Image quality was adequate.  Study completion:  There were no complications.          Transthoracic echocardiography.  M-mode, complete 2D, spectral Doppler, and color Doppler.  Birthdate: Patient birthdate: 1975/03/17.  Age:  Patient is 47 yr old.  Sex: Gender: female.    BMI: 27.5 kg/m^2.  Blood pressure:     154/97 Patient status:  Inpatient.  Study date:  Study date: 06/18/2016. Study time: 01:53 PM.  Location:  Bedside.  -------------------------------------------------------------------  ------------------------------------------------------------------- Left ventricle:  The cavity size was normal. There was severe concentric hypertrophy. Systolic function was normal. The estimated ejection fraction was in the range of 50% to 55%. Wall motion was normal; there were no regional wall motion abnormalities. Doppler parameters are consistent with abnormal left ventricular relaxation (grade 1 diastolic dysfunction). Doppler parameters are consistent with high ventricular filling pressure.  ------------------------------------------------------------------- Aortic valve:   Trileaflet; normal thickness leaflets. Mobility was not restricted.  Doppler:  Transvalvular velocity was within the normal range. There was no stenosis. There was no regurgitation.  ------------------------------------------------------------------- Aorta:  Aortic root: The aortic root was normal in size.  ------------------------------------------------------------------- Mitral valve:   Structurally normal valve.   Mobility was not restricted.  Doppler:  Transvalvular velocity was within the normal range. There was no evidence for stenosis. There was trivial regurgitation.    Valve area by pressure half-time: 3.33 cm^2. Indexed valve area by pressure half-time: 1.63 cm^2/m^2.     Peak gradient (D): 3 mm Hg.  ------------------------------------------------------------------- Left atrium:  The atrium was normal in size.  ------------------------------------------------------------------- Right ventricle:  The cavity size was normal. Wall thickness was normal. Systolic function was normal.  ------------------------------------------------------------------- Pulmonic valve:    Structurally normal valve.   Cusp separation was normal.  Doppler:  Transvalvular velocity was within the normal range. There was no evidence for stenosis. There was no regurgitation.  ------------------------------------------------------------------- Tricuspid valve:   Structurally normal valve.    Doppler: Transvalvular velocity was within the normal range. There was no regurgitation.  ------------------------------------------------------------------- Pulmonary artery:   The main pulmonary artery was normal-sized. Systolic pressure could not be accurately estimated.  ------------------------------------------------------------------- Right atrium:  The atrium was normal in size.  ------------------------------------------------------------------- Pericardium:  There was no pericardial effusion.  ------------------------------------------------------------------- Systemic veins: Inferior vena cava: The vessel was normal in size. The respirophasic diameter changes were in the normal range (>= 50%), consistent with normal central venous pressure.  ------------------------------------------------------------------- Measurements  Left ventricle                          Value          Reference LV ID, ED, PLAX chordal                 44    mm       43 - 52 LV ID, ES, PLAX chordal                 32    mm       23 - 38 LV fx shortening, PLAX chordal  (L)     27    %        >=  29 LV PW thickness, ED                     17    mm       ---------- IVS/LV PW ratio, ED             (H)      1.41           <=1.3 Stroke volume, 2D                       60    ml       ---------- Stroke volume/bsa, 2D                   29    ml/m^2   ---------- LV e&', lateral                          4.35  cm/s     ---------- LV E/e&', lateral                        19.49          ---------- LV e&', medial                           5.44  cm/s     ---------- LV E/e&', medial                         15.59          ---------- LV e&', average                          4.9   cm/s     ---------- LV E/e&', average                        17.32          ----------  Ventricular septum                      Value          Reference IVS thickness, ED                       24    mm       ----------  LVOT                                    Value          Reference LVOT ID, S                              21    mm       ---------- LVOT area                               3.46  cm^2     ---------- LVOT peak velocity, S                   95.8  cm/s     ---------- LVOT mean velocity, S  66.7  cm/s     ---------- LVOT VTI, S                             17.4  cm       ---------- LVOT peak gradient, S                   4     mm Hg    ----------  Aorta                                   Value          Reference Aortic root ID, ED                      25    mm       ----------  Left atrium                             Value          Reference LA ID, A-P, ES                          42    mm       ---------- LA ID/bsa, A-P                          2.05  cm/m^2   <=2.2 LA volume, S                            50.9  ml       ---------- LA volume/bsa, S                        24.9  ml/m^2   ---------- LA volume, ES, 1-p A4C                  45.4  ml       ---------- LA volume/bsa, ES, 1-p A4C              22.2  ml/m^2   ---------- LA volume, ES, 1-p A2C                  55.5  ml       ---------- LA volume/bsa, ES, 1-p A2C              27.2  ml/m^2   ----------  Mitral valve                             Value          Reference Mitral E-wave peak velocity             84.8  cm/s     ---------- Mitral deceleration time                225   ms       150 - 230 Mitral pressure half-time               66    ms       ---------- Mitral peak gradient, D  3     mm Hg    ---------- Mitral valve area, PHT, DP              3.33  cm^2     ---------- Mitral valve area/bsa, PHT, DP          1.63  cm^2/m^2 ----------  Right atrium                            Value          Reference RA ID, S-I, ES, A4C             (H)     51.8  mm       34 - 49 RA area, ES, A4C                        11.4  cm^2     8.3 - 19.5 RA volume, ES, A/L                      21.4  ml       ---------- RA volume/bsa, ES, A/L                  10.5  ml/m^2   ----------  Systemic veins                          Value          Reference Estimated CVP                           3     mm Hg    ----------  Right ventricle                         Value          Reference RV ID, minor axis, ED, A4C base         27    mm       ---------- TAPSE                                   20    mm       ---------- RV s&', lateral, S                       15.1  cm/s     ----------  Legend: (L)  and  (H)  mark values outside specified reference range.  ------------------------------------------------------------------- Prepared and Electronically Authenticated by  Chilton Si, MD 2018-06-13T17:04:05             Risk Assessment/Calculations:             Physical Exam:   VS:  BP 100/67 (BP Location: Left Arm, Patient Position: Sitting, Cuff Size: Normal)   Pulse 70   Ht 5\' 8"  (1.727 m)   Wt 183 lb 9.6 oz (83.3 kg)   SpO2 95%   BMI 27.92 kg/m    Wt Readings from Last 3 Encounters:  08/13/22 183 lb 9.6 oz (83.3 kg)  08/04/22 183 lb 3.2 oz (83.1 kg)  07/21/22 183 lb 12.8 oz (83.4 kg)    GEN: Well nourished, well developed in no acute distress NECK: No JVD;  No carotid bruits CARDIAC: RRR, no murmurs, rubs,  gallops RESPIRATORY:  Clear to auscultation without rales, wheezing or rhonchi  ABDOMEN: Soft, non-tender, non-distended EXTREMITIES:  No edema; No deformity   ASSESSMENT AND PLAN: .    Resistant hypertension: Blood pressure finally settled on the current regimen.  Systolic blood pressure has been around the 100 mmHg to recent office visit.  She denies any dizziness, blurry vision or feeling of passing out.  She is feeling well on the current regimen.  She has no problem affording the current regiment with Medicaid.  She has enough refills for all 3 of her blood pressure medication to last her a year.  I recommend a 58-month follow-up with Dr. Rennis Golden.  I we will obtain a basic metabolic panel today to make sure her renal function and electrolyte is stable.  Medication management: I obtain basic metabolic panel.       Dispo: Follow-up with Dr. Rennis Golden in 1 year.  Signed, Azalee Course, PA

## 2022-08-25 ENCOUNTER — Ambulatory Visit: Payer: Medicaid Other | Admitting: Licensed Clinical Social Worker

## 2022-09-04 ENCOUNTER — Other Ambulatory Visit: Payer: Self-pay

## 2022-09-04 ENCOUNTER — Ambulatory Visit: Payer: Medicaid Other | Admitting: Internal Medicine

## 2022-09-04 VITALS — BP 119/78 | HR 83 | Temp 98.3°F | Resp 18 | Ht 67.0 in | Wt 187.3 lb

## 2022-09-04 DIAGNOSIS — D75839 Thrombocytosis, unspecified: Secondary | ICD-10-CM

## 2022-09-04 DIAGNOSIS — F1090 Alcohol use, unspecified, uncomplicated: Secondary | ICD-10-CM | POA: Diagnosis not present

## 2022-09-04 DIAGNOSIS — D582 Other hemoglobinopathies: Secondary | ICD-10-CM

## 2022-09-04 DIAGNOSIS — F17200 Nicotine dependence, unspecified, uncomplicated: Secondary | ICD-10-CM

## 2022-09-04 DIAGNOSIS — S39012A Strain of muscle, fascia and tendon of lower back, initial encounter: Secondary | ICD-10-CM

## 2022-09-04 DIAGNOSIS — R7989 Other specified abnormal findings of blood chemistry: Secondary | ICD-10-CM

## 2022-09-04 DIAGNOSIS — K219 Gastro-esophageal reflux disease without esophagitis: Secondary | ICD-10-CM | POA: Diagnosis not present

## 2022-09-04 DIAGNOSIS — F17210 Nicotine dependence, cigarettes, uncomplicated: Secondary | ICD-10-CM | POA: Diagnosis not present

## 2022-09-04 DIAGNOSIS — K30 Functional dyspepsia: Secondary | ICD-10-CM

## 2022-09-04 MED ORDER — METHOCARBAMOL 750 MG PO TABS
750.0000 mg | ORAL_TABLET | Freq: Three times a day (TID) | ORAL | 0 refills | Status: AC | PRN
Start: 2022-09-04 — End: 2022-09-14

## 2022-09-04 MED ORDER — NICOTINE 14 MG/24HR TD PT24: 14.0000 mg | MEDICATED_PATCH | TRANSDERMAL | 2 refills | Status: AC

## 2022-09-04 MED ORDER — NICOTINE POLACRILEX 2 MG MT GUM: 2.0000 mg | CHEWING_GUM | OROMUCOSAL | 0 refills | Status: AC | PRN

## 2022-09-04 MED ORDER — PANTOPRAZOLE SODIUM 40 MG PO TBEC: 40.0000 mg | DELAYED_RELEASE_TABLET | Freq: Every day | ORAL | 2 refills | Status: AC

## 2022-09-04 NOTE — Patient Instructions (Signed)
[name], it was a pleasure seeing you today! You endorsed feeling well today. Below are some of the things we talked about this visit. We look forward to seeing you in the follow up appointment!  Today we discussed: Please start your protonix today.  For your back pain, I will start a muscle relaxer. Use up to 3 grams of tylenol and use the muscle relaxer. Minimize use of medications called NSAIDs.  I will give you nicotene patch to help you with smoking cessation. Please set a quit date.   I have ordered the following labs today:   Lab Orders         CBC no Diff         Liver Profile       Referrals ordered today:   Referral Orders  No referral(s) requested today     I have ordered the following medication/changed the following medications:   Stop the following medications: Medications Discontinued During This Encounter  Medication Reason   pantoprazole (PROTONIX) 40 MG tablet Reorder     Start the following medications: Meds ordered this encounter  Medications   pantoprazole (PROTONIX) 40 MG tablet    Sig: Take 1 tablet (40 mg total) by mouth daily.    Dispense:  30 tablet    Refill:  2   nicotine (NICODERM CQ - DOSED IN MG/24 HOURS) 14 mg/24hr patch    Sig: Place 1 patch (14 mg total) onto the skin daily.    Dispense:  30 patch    Refill:  2   nicotine polacrilex (NICORETTE) 2 MG gum    Sig: Take 1 each (2 mg total) by mouth as needed for smoking cessation.    Dispense:  100 tablet    Refill:  0     Follow-up: 1 month follow up   Please make sure to arrive 15 minutes prior to your next appointment. If you arrive late, you may be asked to reschedule.   We look forward to seeing you next time. Please call our clinic at [phone] if you have any questions or concerns. The best time to call is Monday-Friday from 9am-4pm, but there is someone available 24/7. If after hours or the weekend, call the main hospital number and ask for the Internal Medicine  Resident On-Call. If you need medication refills, please notify your pharmacy one week in advance and they will send [place] a request.  Thank you for letting us take part in your care. Wishing you the best!  Thank you, Gwenevere Abbot, MD

## 2022-09-04 NOTE — Progress Notes (Signed)
CC: back pain  HPI:  [name] is a 47 y.o. with medical history of HTN, HLD, and GAD presenting to [facility] for concern of low back pain.   Please see problem-based list for further details, assessments, and plans.  Past Medical History:  Diagnosis Date   Abdominal pain, RLQ 06/23/2014   Anemia    Anxiety    Chest pain    Depression    Elevated LFTs 04/26/2012   Hypertension    Migraine    Palpitations      Current Outpatient Medications (Cardiovascular):    amlodipine-olmesartan (AZOR) 10-20 MG tablet, Take 1 tablet by mouth daily.   atorvastatin (LIPITOR) 80 MG tablet, Take 1 tablet (80 mg total) by mouth daily.   EPINEPHrine 0.3 mg/0.3 mL IJ SOAJ injection, Inject 0.3 mg into the muscle as needed for anaphylaxis.   metoprolol succinate (TOPROL XL) 50 MG 24 hr tablet, Take 1 tablet (50 mg total) by mouth daily. Take with or immediately following a meal.   triamterene-hydrochlorothiazide (MAXZIDE-25) 37.5-25 MG tablet, Take 1 tablet by mouth daily.   Current Outpatient Medications (Analgesics):    acetaminophen (TYLENOL) 500 MG tablet, Take 1,000 mg by mouth daily as needed for headache (pain).   Current Outpatient Medications (Other):    methocarbamol (ROBAXIN-750) 750 MG tablet, Take 1 tablet (750 mg total) by mouth every 8 (eight) hours as needed for up to 10 days for muscle spasms.   nicotine (NICODERM CQ - DOSED IN MG/24 HOURS) 14 mg/24hr patch, Place 1 patch (14 mg total) onto the skin daily.   nicotine polacrilex (NICORETTE) 2 MG gum, Take 1 each (2 mg total) by mouth as needed for smoking cessation.   hydrOXYzine (ATARAX) 25 MG tablet, Take 1 tablet (25 mg total) by mouth every 6 (six) hours as needed for anxiety.   pantoprazole (PROTONIX) 40 MG tablet, Take 1 tablet (40 mg total) by mouth daily.   sertraline (ZOLOFT) 50 MG tablet, Take 1 tablet (50 mg total) by mouth daily.  Review of Systems:  Review of system negative unless stated in the problem  list or HPI.    Physical Exam:  Vitals:   09/04/22 1316  BP: 119/78  Pulse: 83  Resp: 18  Temp: 98.3 F (36.8 C)  TempSrc: Oral  SpO2: 100%  Weight: 187 lb 4.8 oz (85 kg)  Height: 5\' 7"  (1.702 m)   Physical Exam General: NAD HENT: NCAT Lungs: CTAB, no wheeze, rhonchi or rales.  Cardiovascular: Normal heart sounds, no r/m/g, 2+ pulses in all extremities. No LE edema Abdomen: No TTP, normal bowel sounds MSK: No asymmetry or muscle atrophy. Improvement in lumbar pain with pressure. Negative leg raise test. Good strength in all extremities. Good sensation in all extremities.  Skin: no lesions noted on exposed skin Neuro: Alert and oriented x4. CN grossly intact Psych: Normal mood and normal affect   Assessment & Plan:   Lumbar strain, initial encounter Pt with symptoms and exam concerning for lumbar strain. Physical exam with good strength and sensation in lower extremities. No spinal tenderness and negative straight leg raise test make spinal pathology unlikely. Given only one week of symptoms, will advise treating conservatively with tylenol, thermal therapy, and exercise. Given hx of dyspepsia, will previous concern for epigastric pain, will avoid NSAID and prescribe robaxin. Provided exercises for lumbar strain and hopeful her pain will resolve in the next few weeks.   GERD (gastroesophageal reflux disease) Pt with hx of GERD and recently complain  of abdominal pain states she is still getting reflux symptoms. She is not taking pantoprazole as she recalls being told to stop this medication. I advised her that was for her H. Pylori testing and she can start taking this medication. She has no epigastric pain but only complaint of reflux. She uses alcohol and tobacco, and given her current concern, I counseled her on cessation of both of these as both can exacerbate reflux symptoms. Pt was previously referred to GI for her epigastric pain but appears that has subsided. She will still  benefit from seeing them as she needs her colonoscopy. Will monitor her symptoms on PPI trial and the lifestyle modifications.    Tobacco use disorder Pt with hx of tobacco use disorder, interested in cessation. Nicotine patch and gum sent to the pharmacy and advised on setting a quit date. Advised on the importance of tobacco cessation on cancer prevention, improved lung function and improving her reflux symptoms.   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 Discussed alcohol use with the pt and its effect on her liver. She states she will cut down her alcohol use. Does not currently want pharmacotherapy. Lab work repeated to calculate her fib 4 score which showed score of 0.9 excluding fibrosis. Plan to monitor her alcohol use at subsequent visits and continue to offer pharmacotherapy if needed.    See Encounters Tab for problem based charting.  Patient Discussed with Dr. Bryson Corona, MD Eligha Bridegroom. [facility] Internal Medicine Residency, PGY-3

## 2022-09-05 LAB — HEPATIC FUNCTION PANEL
ALT: 28 IU/L (ref 0–32)
AST: 23 IU/L (ref 0–40)
Albumin: 4.5 g/dL (ref 3.9–4.9)
Alkaline Phosphatase: 99 IU/L (ref 44–121)
Bilirubin Total: <0.2 mg/dL (ref 0.0–1.2)
Bilirubin, Direct: <0.1 mg/dL (ref 0.00–0.40)
Total Protein: 7.5 g/dL (ref 6.0–8.5)

## 2022-09-05 LAB — CBC
Hematocrit: 38.1 % (ref 34.0–46.6)
Hemoglobin: 13.5 g/dL (ref 11.1–15.9)
MCH: 32.5 pg (ref 26.6–33.0)
MCHC: 35.4 g/dL (ref 31.5–35.7)
MCV: 92 fL (ref 79–97)
Platelets: 226 10*3/uL (ref 150–450)
RBC: 4.16 x10E6/uL (ref 3.77–5.28)
RDW: 13 % (ref 11.7–15.4)
WBC: 10.6 10*3/uL (ref 3.4–10.8)

## 2022-09-06 DIAGNOSIS — S39012A Strain of muscle, fascia and tendon of lower back, initial encounter: Secondary | ICD-10-CM | POA: Insufficient documentation

## 2022-09-06 NOTE — Assessment & Plan Note (Addendum)
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 Discussed alcohol use with the pt and its effect on her liver. She states she will cut down her alcohol use. Does not currently want pharmacotherapy. Lab work repeated to calculate her fib 4 score which showed score of 0.9 excluding fibrosis. Plan to monitor her alcohol use at subsequent visits and continue to offer pharmacotherapy if needed.

## 2022-09-06 NOTE — Assessment & Plan Note (Signed)
Pt with hx of tobacco use disorder, interested in cessation. Nicotine patch and gum sent to the pharmacy and advised on setting a quit date. Advised on the importance of tobacco cessation on cancer prevention, improved lung function and improving her reflux symptoms.

## 2022-09-06 NOTE — Assessment & Plan Note (Signed)
Pt with hx of GERD and recently complain of abdominal pain states she is still getting reflux symptoms. She is not taking pantoprazole as she recalls being told to stop this medication. I advised her that was for her H. Pylori testing and she can start taking this medication. She has no epigastric pain but only complaint of reflux. She uses alcohol and tobacco, and given her current concern, I counseled her on cessation of both of these as both can exacerbate reflux symptoms. Pt was previously referred to GI for her epigastric pain but appears that has subsided. She will still benefit from seeing them as she needs her colonoscopy. Will monitor her symptoms on PPI trial and the lifestyle modifications.

## 2022-09-06 NOTE — Assessment & Plan Note (Signed)
Pt with symptoms and exam concerning for lumbar strain. Physical exam with good strength and sensation in lower extremities. No spinal tenderness and negative straight leg raise test make spinal pathology unlikely. Given only one week of symptoms, will advise treating conservatively with tylenol, thermal therapy, and exercise. Given hx of dyspepsia, will previous concern for epigastric pain, will avoid NSAID and prescribe robaxin. Provided exercises for lumbar strain and hopeful her pain will resolve in the next few weeks.

## 2022-09-09 ENCOUNTER — Encounter: Payer: Self-pay | Admitting: Internal Medicine

## 2022-09-09 ENCOUNTER — Telehealth: Payer: Self-pay

## 2022-09-09 NOTE — Telephone Encounter (Signed)
Pt is requesting a call back ... She stated that  she missed a call from the office I saw where Dr Welton Flakes  has sent out a letter with lab results

## 2022-09-09 NOTE — Progress Notes (Signed)
Internal Medicine Clinic Attending  Case discussed with the resident at the time of the visit.  We reviewed the resident's history and exam and pertinent patient test results.  I agree with the assessment, diagnosis, and plan of care documented in the resident's note.  

## 2022-10-06 ENCOUNTER — Other Ambulatory Visit: Payer: Self-pay

## 2022-10-06 ENCOUNTER — Encounter: Payer: Self-pay | Admitting: Student

## 2022-10-06 ENCOUNTER — Ambulatory Visit: Payer: Medicaid Other | Admitting: Student

## 2022-10-06 VITALS — BP 105/75 | HR 93 | Temp 98.2°F | Ht 68.0 in | Wt 189.8 lb

## 2022-10-06 DIAGNOSIS — F17210 Nicotine dependence, cigarettes, uncomplicated: Secondary | ICD-10-CM

## 2022-10-06 DIAGNOSIS — S39012D Strain of muscle, fascia and tendon of lower back, subsequent encounter: Secondary | ICD-10-CM | POA: Diagnosis not present

## 2022-10-06 DIAGNOSIS — M109 Gout, unspecified: Secondary | ICD-10-CM | POA: Diagnosis present

## 2022-10-06 DIAGNOSIS — B351 Tinea unguium: Secondary | ICD-10-CM | POA: Diagnosis not present

## 2022-10-06 DIAGNOSIS — K219 Gastro-esophageal reflux disease without esophagitis: Secondary | ICD-10-CM

## 2022-10-06 DIAGNOSIS — F1090 Alcohol use, unspecified, uncomplicated: Secondary | ICD-10-CM

## 2022-10-06 DIAGNOSIS — S39012A Strain of muscle, fascia and tendon of lower back, initial encounter: Secondary | ICD-10-CM

## 2022-10-06 DIAGNOSIS — F17200 Nicotine dependence, unspecified, uncomplicated: Secondary | ICD-10-CM

## 2022-10-06 MED ORDER — COLCHICINE 0.6 MG PO TABS: 0.6000 mg | ORAL_TABLET | Freq: Every day | ORAL | 2 refills | Status: AC

## 2022-10-06 MED ORDER — VARENICLINE TARTRATE (STARTER) 0.5 MG X 11 & 1 MG X 42 PO TBPK
ORAL_TABLET | ORAL | 0 refills | Status: AC
Start: 2022-10-06 — End: 2022-11-24

## 2022-10-06 MED ORDER — ALLOPURINOL 100 MG PO TABS
100.0000 mg | ORAL_TABLET | Freq: Every day | ORAL | 3 refills | Status: AC
Start: 2022-10-06 — End: ?

## 2022-10-06 NOTE — Patient Instructions (Addendum)
  Thank you, Katelyn [name], for allowing [place] to provide your care today. Today we discussed . . .  > Quitting smoking       -I sent in a prescription for Chantix that should help you with the cigarette cravings.  I recommend that you quit smoking either when you start this medication or 1 week after starting the medication.  You will take 0.5 mg daily for 3 days and then 0.5 mg twice daily for 4 days after this you will take 1 mg twice daily.  Please call us for a refill if this medication is working.  If you have any issues please not hesitate to call us and we will try different strategy. > Gout       -We will start gout preventative therapy today with allopurinol and colchicine.  You will take allopurinol 100 mg daily and colchicine 0.6 mg daily.  You will be on the colchicine for 3 months and then just the allopurinol after that.  We will check a uric acid level today and again at your next visit.  If you have any gout flares please call our office. > Abdominal pain       -I am glad that your abdominal pain has improved after starting the Protonix.  Please continue taking this medication and contact the gastroenterology office below to schedule an appoint with them for further evaluation and to consider screening colonoscopy.  [facility] Gastroenterologist in [place], [place] Located in: [facility] [address] Address: [address], [place], [place] [address] Phone: [phone]   I have ordered the following labs for you:  Lab Orders         Uric acid       Referrals ordered today:   Referral Orders  No referral(s) requested today      I have ordered the following medication/changed the following medications:   Stop the following medications: There are no discontinued medications.   Start the following medications: Meds ordered this encounter  Medications   Varenicline Tartrate, Starter, (CHANTIX  STARTING MONTH PAK) 0.5 MG X 11 & 1 MG X 42 TBPK    Sig: Take 0.5 mg by mouth daily for 3 days, THEN 0.5 mg 2 (two) times daily for 4 days, THEN 1 mg 2 (two) times daily.    Dispense:  53 each    Refill:  0   allopurinol (ZYLOPRIM) 100 MG tablet    Sig: Take 1 tablet (100 mg total) by mouth daily.    Dispense:  30 tablet    Refill:  3   colchicine 0.6 MG tablet    Sig: Take 1 tablet (0.6 mg total) by mouth daily.    Dispense:  30 tablet    Refill:  2      Follow up: 3-4 months    Remember:     Should you have any questions or concerns please call the internal medicine clinic at [phone].     Rocky Morel, DO [facility]

## 2022-10-06 NOTE — Progress Notes (Unsigned)
CC: Routine Follow Up   HPI:  Katelyn [name] is a 47 y.o. female with pertinent PMH of HTN, GERD, TUD, AUD, GAD, and gout who presents to the clinic for follow-up visit after being seen 09/04/2022. Please see assessment and plan below for further details.  Past Medical History:  Diagnosis Date   Abdominal pain, RLQ 06/23/2014   Anemia    Anxiety    Chest pain    Depression    Elevated LFTs 04/26/2012   Hypertension    Migraine    Palpitations     Current Outpatient Medications  Medication Instructions   acetaminophen (TYLENOL) 1,000 mg, Oral, Daily PRN   allopurinol (ZYLOPRIM) 100 mg, Oral, Daily   amlodipine-olmesartan (AZOR) 10-20 MG tablet 1 tablet, Oral, Daily   atorvastatin (LIPITOR) 80 mg, Oral, Daily   colchicine 0.6 mg, Oral, Daily   EPINEPHrine (EPI-PEN) 0.3 mg, Intramuscular, As needed   hydrOXYzine (ATARAX) 25 mg, Oral, Every 6 hours PRN   metoprolol succinate (TOPROL XL) 50 mg, Oral, Daily, Take with or immediately following a meal.   nicotine (NICODERM CQ - DOSED IN MG/24 HOURS) 14 mg, Transdermal, Every 24 hours   nicotine polacrilex (NICORETTE) 2 mg, Oral, As needed   pantoprazole (PROTONIX) 40 mg, Oral, Daily   sertraline (ZOLOFT) 50 mg, Oral, Daily   triamterene-hydrochlorothiazide (MAXZIDE-25) 37.5-25 MG tablet 1 tablet, Oral, Daily   Varenicline Tartrate, Starter, (CHANTIX STARTING MONTH PAK) 0.5 MG X 11 & 1 MG X 42 TBPK Take 0.5 mg by mouth daily for 3 days, THEN 0.5 mg 2 (two) times daily for 4 days, THEN 1 mg 2 (two) times daily.     Review of Systems:   Pertinent items noted in HPI and/or A&P.  Physical Exam:  Vitals:   10/06/22 1320  BP: 105/75  Pulse: 93  Temp: 98.2 F (36.8 C)  TempSrc: Oral  SpO2: 100%  Weight: 189 lb 12.8 oz (86.1 kg)  Height: 5\' 8"  (1.727 m)    Constitutional: Well-appearing adult female. In no acute distress. HEENT: Normocephalic, atraumatic, Sclera non-icteric, PERRL, EOM intact Cardio:Regular rate and  rhythm. 2+ bilateral radial and dorsalis pedis  pulses. Pulm:Clear to auscultation bilaterally. Normal work of breathing on room air. Abdomen: Soft, non-tender, non-distended, positive bowel sounds. ZOX:WRUEAVWU for extremity edema.  Darkened and slightly thickened nails on the first toe on both feet consistent with onychia mycosis without evidence of skin involvement.  Mildly tender left first MTP joint without significant edema or overlying erythema. Skin:Warm and dry. Neuro:Alert and oriented x3. No focal deficit noted. Psych:Pleasant mood and affect.   Assessment & Plan:   Gout Patient is currently having an improving but still moderately bothersome gout flare in her left first MTP.  This began 2 weeks ago and the symptoms peaked about 1 week ago.  She is not on any gout prophylaxis but does have some colchicine at home and has been taking 0.6 mg daily since the attack.  She has had multiple gout attacks that cause a disruption in her life and is interested in prophylaxis. - Start allopurinol 100 mg daily and continue colchicine 0.6 mg daily for 3 months - Uric acid today  Onychomycosis Patient complains of persistent nail discoloration primarily on the first toe of both feet.  Exam is consistent with onychomycosis.  She is interested in treatment and I believe she would benefit from podiatry evaluation. - Refer to podiatry  GERD (gastroesophageal reflux disease) At her last visit she was started on  a PPI for her recurrent abdominal pain concerning for reflux.  Since then she continues to have abdominal pain after eating and is described as crampy but this is happening much less frequently.  This used to be every day but now is happening maybe twice a week.  She has 1-2 soft bowel movements a day without any dark stools or blood.  Her pain does improve with bowel movements.  H. pylori negative from 07/2022 after treatment in 2023.  Symptoms at the moment seem consistent with IBS and she does  have a referral to gastroenterology for colonoscopy.  I recommended that she discuss this with her gastroenterologist if it does not continue to improve. - Continue pantoprazole 40 mg daily  Lumbar strain, initial encounter At her last visit patient complained of some lumbar strain and was prescribed Robaxin.  She did not pick up this medication but this pain has resolved and she does not feel the need to discuss it further at this visit.  Recommended that she let us know if it returns.  Tobacco use disorder Patient is interested in quitting smoking.  She used to smoke 1 pack/day and is now down to 5 cigarettes a day.  She is interested in Chantix so we will start this today.  Discussed quit date of either when she starts the Chantix for 1 week and to her course. - Chantix starter pack  Alcohol use disorder Since her last visit patient has significantly decreased her alcohol intake.  She used to drink every day and now she is having only 1-2 drinks on the weekend.  She is happy with her progress and I continue to encourage her to reach out to [place] if she does have any questions or significant relapse.    Patient discussed with Dr. Claudie Revering, DO [facility] Internal Medicine Resident PGY-2 Clinic Phone: [phone] Pager: [phone]

## 2022-10-07 LAB — URIC ACID: Uric Acid: 11 mg/dL — ABNORMAL HIGH (ref 2.6–6.2)

## 2022-10-09 DIAGNOSIS — B351 Tinea unguium: Secondary | ICD-10-CM | POA: Insufficient documentation

## 2022-10-09 NOTE — Assessment & Plan Note (Signed)
At her last visit she was started on a PPI for her recurrent abdominal pain concerning for reflux.  Since then she continues to have abdominal pain after eating and is described as crampy but this is happening much less frequently.  This used to be every day but now is happening maybe twice a week.  She has 1-2 soft bowel movements a day without any dark stools or blood.  Her pain does improve with bowel movements.  H. pylori negative from 07/2022 after treatment in 2023.  Symptoms at the moment seem consistent with IBS and she does have a referral to gastroenterology for colonoscopy.  I recommended that she discuss this with her gastroenterologist if it does not continue to improve. - Continue pantoprazole 40 mg daily

## 2022-10-09 NOTE — Assessment & Plan Note (Signed)
Patient is currently having an improving but still moderately bothersome gout flare in her left first MTP.  This began 2 weeks ago and the symptoms peaked about 1 week ago.  She is not on any gout prophylaxis but does have some colchicine at home and has been taking 0.6 mg daily since the attack.  She has had multiple gout attacks that cause a disruption in her life and is interested in prophylaxis. - Start allopurinol 100 mg daily and continue colchicine 0.6 mg daily for 3 months - Uric acid today

## 2022-10-09 NOTE — Assessment & Plan Note (Signed)
Since her last visit patient has significantly decreased her alcohol intake.  She used to drink every day and now she is having only 1-2 drinks on the weekend.  She is happy with her progress and I continue to encourage her to reach out to [place] if she does have any questions or significant relapse.

## 2022-10-09 NOTE — Assessment & Plan Note (Signed)
At her last visit patient complained of some lumbar strain and was prescribed Robaxin.  She did not pick up this medication but this pain has resolved and she does not feel the need to discuss it further at this visit.  Recommended that she let us know if it returns.

## 2022-10-09 NOTE — Assessment & Plan Note (Signed)
Patient complains of persistent nail discoloration primarily on the first toe of both feet.  Exam is consistent with onychomycosis.  She is interested in treatment and I believe she would benefit from podiatry evaluation. - Refer to podiatry

## 2022-10-09 NOTE — Assessment & Plan Note (Signed)
Patient is interested in quitting smoking.  She used to smoke 1 pack/day and is now down to 5 cigarettes a day.  She is interested in Chantix so we will start this today.  Discussed quit date of either when she starts the Chantix for 1 week and to her course. - Chantix starter pack

## 2022-10-13 NOTE — Progress Notes (Signed)
Internal Medicine Clinic Attending  Case discussed with the resident at the time of the visit.  We reviewed the resident's history and exam and pertinent patient test results.  I agree with the assessment, diagnosis, and plan of care documented in the resident's note.  

## 2022-10-16 NOTE — Progress Notes (Signed)
Baseline uric acid level of 11 at the start of allopurinol treatment. Plan to repeat at next visit.

## 2022-11-24 ENCOUNTER — Other Ambulatory Visit: Payer: Self-pay

## 2022-11-24 DIAGNOSIS — F411 Generalized anxiety disorder: Secondary | ICD-10-CM

## 2022-11-24 MED ORDER — HYDROXYZINE HCL 25 MG PO TABS: 25.0000 mg | ORAL_TABLET | Freq: Four times a day (QID) | ORAL | 2 refills | Status: AC | PRN

## 2022-11-24 NOTE — Telephone Encounter (Signed)
Medication sent to pharmacy  

## 2022-12-23 ENCOUNTER — Encounter: Payer: Self-pay | Admitting: Student

## 2022-12-23 ENCOUNTER — Telehealth: Payer: Self-pay | Admitting: Student

## 2022-12-23 DIAGNOSIS — Z1231 Encounter for screening mammogram for malignant neoplasm of breast: Secondary | ICD-10-CM

## 2022-12-23 NOTE — Telephone Encounter (Signed)
Pt is unable to sch her yearly mammogram.  The pt

## 2022-12-30 ENCOUNTER — Other Ambulatory Visit: Payer: Self-pay | Admitting: Internal Medicine

## 2022-12-30 DIAGNOSIS — F321 Major depressive disorder, single episode, moderate: Secondary | ICD-10-CM

## 2023-01-02 ENCOUNTER — Other Ambulatory Visit: Payer: Self-pay

## 2023-01-02 DIAGNOSIS — F321 Major depressive disorder, single episode, moderate: Secondary | ICD-10-CM

## 2023-01-02 MED ORDER — SERTRALINE HCL 50 MG PO TABS: 50.0000 mg | ORAL_TABLET | Freq: Every day | ORAL | 2 refills | Status: DC

## 2023-01-12 ENCOUNTER — Ambulatory Visit: Payer: Medicaid Other | Admitting: Student

## 2023-01-12 ENCOUNTER — Other Ambulatory Visit: Payer: Self-pay | Admitting: Student

## 2023-01-12 ENCOUNTER — Encounter: Payer: Self-pay | Admitting: Student

## 2023-01-12 VITALS — BP 126/85 | HR 79 | Temp 98.5°F | Ht 66.0 in | Wt 196.5 lb

## 2023-01-12 DIAGNOSIS — M109 Gout, unspecified: Secondary | ICD-10-CM

## 2023-01-12 DIAGNOSIS — I1A0 Resistant hypertension: Secondary | ICD-10-CM

## 2023-01-12 DIAGNOSIS — Z0000 Encounter for general adult medical examination without abnormal findings: Secondary | ICD-10-CM

## 2023-01-12 DIAGNOSIS — Z23 Encounter for immunization: Secondary | ICD-10-CM | POA: Diagnosis not present

## 2023-01-12 DIAGNOSIS — N644 Mastodynia: Secondary | ICD-10-CM | POA: Diagnosis present

## 2023-01-12 DIAGNOSIS — F17210 Nicotine dependence, cigarettes, uncomplicated: Secondary | ICD-10-CM | POA: Diagnosis not present

## 2023-01-12 DIAGNOSIS — K219 Gastro-esophageal reflux disease without esophagitis: Secondary | ICD-10-CM

## 2023-01-12 DIAGNOSIS — I10 Essential (primary) hypertension: Secondary | ICD-10-CM | POA: Diagnosis not present

## 2023-01-12 MED ORDER — TRIAMTERENE-HCTZ 37.5-25 MG PO TABS: 1.0000 | ORAL_TABLET | Freq: Every day | ORAL | 3 refills | Status: AC

## 2023-01-12 NOTE — Assessment & Plan Note (Addendum)
 BP today is 140/90 and 126/85 -Continue Maxzide and Azor

## 2023-01-12 NOTE — Progress Notes (Addendum)
 CC: Follow-up  HPI:  [name] is a 48 y.o. female living with a history stated below and presents today for follow-up. Please see problem based assessment and plan for additional details.  Past Medical History:  Diagnosis Date   Abdominal pain, RLQ 06/23/2014   Anemia    Anxiety    Chest pain    Depression    Elevated LFTs 04/26/2012   Hypertension    Migraine    Palpitations     Current Outpatient Medications on File Prior to Visit  Medication Sig Dispense Refill   acetaminophen  (TYLENOL ) 500 MG tablet Take 1,000 mg by mouth daily as needed for headache (pain).     allopurinol  (ZYLOPRIM ) 100 MG tablet Take 1 tablet (100 mg total) by mouth daily. 30 tablet 3   amlodipine -olmesartan  (AZOR ) 10-20 MG tablet Take 1 tablet by mouth daily. 30 tablet 11   atorvastatin  (LIPITOR) 80 MG tablet Take 1 tablet (80 mg total) by mouth daily. 90 tablet 3   colchicine  0.6 MG tablet Take 1 tablet (0.6 mg total) by mouth daily. 30 tablet 2   EPINEPHrine  0.3 mg/0.3 mL IJ SOAJ injection Inject 0.3 mg into the muscle as needed for anaphylaxis. 1 each 0   hydrOXYzine  (ATARAX ) 25 MG tablet Take 1 tablet (25 mg total) by mouth every 6 (six) hours as needed for anxiety. 30 tablet 2   metoprolol  succinate (TOPROL  XL) 50 MG 24 hr tablet Take 1 tablet (50 mg total) by mouth daily. Take with or immediately following a meal. 30 tablet 11   nicotine  polacrilex (NICORETTE ) 2 MG gum Take 1 each (2 mg total) by mouth as needed for smoking cessation. 100 tablet 0   pantoprazole  (PROTONIX ) 40 MG tablet Take 1 tablet (40 mg total) by mouth daily. 30 tablet 2   sertraline  (ZOLOFT ) 50 MG tablet Take 1 tablet (50 mg total) by mouth daily. 30 tablet 2   [DISCONTINUED] citalopram  (CELEXA ) 10 MG tablet Take 1 tablet (10 mg total) by mouth daily. 30 tablet 2   No current facility-administered medications on file prior to visit.    Family History  Problem Relation Age of Onset   Diabetes Mother     Depression Mother    Hypertension Mother    Hypertension Father    Hypertension Brother    Hypertension Brother    Breast cancer Neg Hx    Colon cancer Neg Hx    Stomach cancer Neg Hx    Esophageal cancer Neg Hx     Social History   Socioeconomic History   Marital status: Married    Spouse name: Not on file   Number of children: 3   Years of education: Not on file   Highest education level: 11th grade  Occupational History   Occupation: Ambulance Person  Tobacco Use   Smoking status: Every Day    Current packs/day: 0.00    Average packs/day: 0.5 packs/day for 24.4 years (12.2 ttl pk-yrs)    Types: Cigarettes    Start date: 01/06/1994    Last attempt to quit: 06/12/2018    Years since quitting: 4.5   Smokeless tobacco: Never   Tobacco comments:    1 pack per week  Vaping Use   Vaping status: Never Used  Substance and Sexual Activity   Alcohol use: Yes    Alcohol/week: 0.0 standard drinks of alcohol    Comment: occassionally   Drug use: No   Sexual activity: Yes    Birth control/protection:  Surgical  Other Topics Concern   Not on file  Social History Narrative   Not on file   Social Drivers of Health   Financial Resource Strain: Not on file  Food Insecurity: No Food Insecurity (09/04/2022)   Hunger Vital Sign    Worried About Running Out of Food in the Last Year: Never true    Ran Out of Food in the Last Year: Never true  Transportation Needs: No Transportation Needs (09/04/2022)   PRAPARE - Administrator, Civil Service (Medical): No    Lack of Transportation (Non-Medical): No  Physical Activity: Not on file  Stress: Not on file  Social Connections: Not on file  Intimate Partner Violence: Not At Risk (09/04/2022)   Humiliation, Afraid, Rape, and Kick questionnaire    Fear of Current or Ex-Partner: No    Emotionally Abused: No    Physically Abused: No    Sexually Abused: No    Review of Systems: ROS negative except for what is noted on the  assessment and plan.  Vitals:   01/12/23 1309 01/12/23 1345  BP: (!) 140/90 126/85  Pulse: (!) 103 79  Temp: 98.5 F (36.9 C)   TempSrc: Oral   SpO2: 100%   Weight: 196 lb 8 oz (89.1 kg)   Height: 5' 6 (1.676 m)     Physical Exam: Constitutional: well-appearing, sitting in chair, in no acute distress Cardiovascular: regular rate and rhythm, no m/r/g, no LEE Pulmonary/Chest: normal work of breathing on room air, lungs clear to auscultation bilaterally MSK: normal bulk and tone Skin: warm and dry Psych: normal mood and behavior  Assessment & Plan:     Patient discussed with Dr. Forest  Hypertension BP today is 140/90 and 126/85 -Continue Maxzide  and Azor   Healthcare Maintenance Tdap today Declined Flu vaccine GI referral sent for colonoscopy  Gout No recent flares -Continue Allopurinol   GERD Asymptomatic since starting below. Completed PPI trial -Start PPI taper with now taking 20 mg daily for 4 weeks and then stop if continually asymptomatic -Re-assess at f/u   Tobacco use disorder Quit date is 01/17/22; currently 5 cigarettes/day -Continue Chantix   Right Breast Pain Chronic but improved. Referral for diagnostic mammogram went through so repeat referral sent today.    Norman Lobstein, D.O. [facility] Internal [facility], PGY-1 Phone: [phone] Date 01/12/2023 Time 1:48 PM

## 2023-01-12 NOTE — Patient Instructions (Signed)
 Please cut your Pantoprazole dose in 1/2 for the next month. So only take 20 mg/daily.  I have sent a referral for a mammography and colonoscopy.  Follow-up in 3 months.

## 2023-01-13 NOTE — Progress Notes (Signed)
 Internal Medicine Clinic Attending  Case discussed with the resident at the time of the visit.  We reviewed the resident's history and exam and pertinent patient test results.  I agree with the assessment, diagnosis, and plan of care documented in the resident's note.

## 2023-02-18 ENCOUNTER — Ambulatory Visit
Admission: RE | Admit: 2023-02-18 | Discharge: 2023-02-18 | Disposition: A | Discharge status: Home / Self Care | Payer: Medicaid Other | Source: Ambulatory Visit | Attending: Internal Medicine | Admitting: Internal Medicine

## 2023-02-18 ENCOUNTER — Ambulatory Visit: Admission: RE | Admit: 2023-02-18 | Payer: Medicaid Other | Source: Ambulatory Visit

## 2023-02-18 ENCOUNTER — Other Ambulatory Visit: Payer: Self-pay | Admitting: Internal Medicine

## 2023-02-18 DIAGNOSIS — R921 Mammographic calcification found on diagnostic imaging of breast: Secondary | ICD-10-CM

## 2023-02-18 DIAGNOSIS — N644 Mastodynia: Secondary | ICD-10-CM

## 2023-03-04 ENCOUNTER — Ambulatory Visit
Admission: RE | Admit: 2023-03-04 | Discharge: 2023-03-04 | Disposition: A | Discharge status: Home / Self Care | Payer: Medicaid Other | Source: Ambulatory Visit | Attending: Internal Medicine | Admitting: Internal Medicine

## 2023-03-04 DIAGNOSIS — R921 Mammographic calcification found on diagnostic imaging of breast: Secondary | ICD-10-CM

## 2023-03-04 HISTORY — PX: BREAST BIOPSY: SHX20

## 2023-03-05 LAB — SURGICAL PATHOLOGY

## 2023-05-29 ENCOUNTER — Other Ambulatory Visit: Payer: Self-pay | Admitting: Internal Medicine

## 2023-05-29 DIAGNOSIS — E782 Mixed hyperlipidemia: Secondary | ICD-10-CM

## 2023-10-21 ENCOUNTER — Other Ambulatory Visit: Payer: Self-pay | Admitting: Student

## 2023-10-21 DIAGNOSIS — F321 Major depressive disorder, single episode, moderate: Secondary | ICD-10-CM

## 2024-01-05 ENCOUNTER — Ambulatory Visit: Payer: Self-pay | Admitting: Student

## 2024-01-26 ENCOUNTER — Other Ambulatory Visit: Payer: Self-pay | Admitting: Student

## 2024-01-26 DIAGNOSIS — Z1231 Encounter for screening mammogram for malignant neoplasm of breast: Secondary | ICD-10-CM

## 2024-02-22 ENCOUNTER — Ambulatory Visit
